# Patient Record
Sex: Female | Born: 1995 | Race: White | Hispanic: No | Marital: Married | Smoking: Current some day smoker
Health system: Southern US, Community
[De-identification: ages and names within clinical notes are randomized; demographics above are authoritative.]

## PROBLEM LIST (undated history)

## (undated) ENCOUNTER — Inpatient Hospital Stay (HOSPITAL_COMMUNITY): Payer: Self-pay

## (undated) DIAGNOSIS — O99345 Other mental disorders complicating the puerperium: Principal | ICD-10-CM

## (undated) DIAGNOSIS — K828 Other specified diseases of gallbladder: Secondary | ICD-10-CM

## (undated) DIAGNOSIS — J45909 Unspecified asthma, uncomplicated: Secondary | ICD-10-CM

## (undated) DIAGNOSIS — F53 Postpartum depression: Secondary | ICD-10-CM

## (undated) DIAGNOSIS — O24419 Gestational diabetes mellitus in pregnancy, unspecified control: Secondary | ICD-10-CM

## (undated) HISTORY — PX: ADENOIDECTOMY: SUR15

## (undated) HISTORY — PX: FRACTURE SURGERY: SHX138

## (undated) HISTORY — DX: Gestational diabetes mellitus in pregnancy, unspecified control: O24.419

## (undated) HISTORY — DX: Postpartum depression: F53.0

## (undated) HISTORY — PX: TONSILLECTOMY: SUR1361

## (undated) HISTORY — DX: Other mental disorders complicating the puerperium: O99.345

---
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
## 2016-01-03 NOTE — ED Notes (Signed)
Message left with patient to return call - re positive UA preg

## 2016-01-17 ENCOUNTER — Encounter: Payer: Self-pay | Admitting: *Deleted

## 2016-01-17 ENCOUNTER — Ambulatory Visit (INDEPENDENT_AMBULATORY_CARE_PROVIDER_SITE_OTHER): Payer: 59

## 2016-01-17 ENCOUNTER — Ambulatory Visit (INDEPENDENT_AMBULATORY_CARE_PROVIDER_SITE_OTHER): Payer: 59 | Admitting: *Deleted

## 2016-01-17 VITALS — BP 130/80 | HR 93 | Ht 62.0 in | Wt 198.0 lb

## 2016-01-17 DIAGNOSIS — Z3A01 Less than 8 weeks gestation of pregnancy: Secondary | ICD-10-CM | POA: Diagnosis not present

## 2016-01-17 DIAGNOSIS — Z331 Pregnant state, incidental: Secondary | ICD-10-CM | POA: Diagnosis not present

## 2016-01-17 DIAGNOSIS — O09611 Supervision of young primigravida, first trimester: Secondary | ICD-10-CM | POA: Diagnosis not present

## 2016-01-17 DIAGNOSIS — O0990 Supervision of high risk pregnancy, unspecified, unspecified trimester: Secondary | ICD-10-CM | POA: Insufficient documentation

## 2016-01-17 DIAGNOSIS — Z3401 Encounter for supervision of normal first pregnancy, first trimester: Secondary | ICD-10-CM

## 2016-01-17 DIAGNOSIS — Z1389 Encounter for screening for other disorder: Secondary | ICD-10-CM | POA: Diagnosis not present

## 2016-01-17 DIAGNOSIS — O3680X0 Pregnancy with inconclusive fetal viability, not applicable or unspecified: Secondary | ICD-10-CM

## 2016-01-17 LAB — POCT URINALYSIS DIPSTICK
GLUCOSE UA: NEGATIVE
Ketones, UA: NEGATIVE
Leukocytes, UA: NEGATIVE
Nitrite, UA: NEGATIVE
Protein, UA: NEGATIVE
RBC UA: NEGATIVE

## 2016-01-17 NOTE — Progress Notes (Signed)
Dating US today. Single IUP with FHR 124 bpm.  CRL measures 6.9 mm which correlates with 6+[redacted] weeks GA.  Bilateral ovaries appear normal. EDD 09/08/16 by early US due to uncertain LMP.

## 2016-01-17 NOTE — Progress Notes (Signed)
Mary Hull is a 20 y.o. G1P0 female here today for initial OB intake/educational visit with RN  Patient's medical, surgical, and obstetrical history obtained and reviewed.  Current medications and allergies also reviewed.   Dating ultrasound today revealed GA of [redacted]wk3d based on LMP/US. EDC 09/03/16   BP 130/80   Pulse 93   Ht 5\' 2"  (1.575 m)   Wt 198 lb (89.8 kg)   LMP 11/28/2015 (Approximate)   BMI 36.21 kg/m   Patient Active Problem List   Diagnosis Date Noted  . Supervision of normal first pregnancy 01/17/2016  . Pregnancy 12/29/2015   Past Medical History:  Diagnosis Date  . Asthma    Past Surgical History:  Procedure Laterality Date  . FRACTURE SURGERY    . TONSILLECTOMY     OB History    Gravida Para Term Preterm AB Living   1             SAB TAB Ectopic Multiple Live Births                  She is taking prenatal vitamins CCNC form completed PN1 labs drawn Baby scripts and mychart activated  Reviewed recommended weight gain based on pre-gravid BMI  Genetic Screening discussed Integrated Screen: requested Cystic fibrosis screening discussed declined  Face-to-face time at least 30 minutes. 50% or more of this visit was spent in counseling and coordination of care.  Return in about 4 weeks (around 02/14/2016) for New OB.   Edward JollyMadren, Latisha Michelle RN-C 01/17/2016 3:15 PM

## 2016-01-18 ENCOUNTER — Encounter: Payer: Self-pay | Admitting: Advanced Practice Midwife

## 2016-01-18 LAB — CBC
Hematocrit: 38 % (ref 34.0–46.6)
Hemoglobin: 12.7 g/dL (ref 11.1–15.9)
MCH: 28.2 pg (ref 26.6–33.0)
MCHC: 33.4 g/dL (ref 31.5–35.7)
MCV: 84 fL (ref 79–97)
PLATELETS: 302 10*3/uL (ref 150–379)
RBC: 4.51 x10E6/uL (ref 3.77–5.28)
RDW: 14.9 % (ref 12.3–15.4)
WBC: 8.9 10*3/uL (ref 3.4–10.8)

## 2016-01-18 LAB — RPR: RPR: NONREACTIVE

## 2016-01-18 LAB — URINALYSIS, ROUTINE W REFLEX MICROSCOPIC
Bilirubin, UA: NEGATIVE
Glucose, UA: NEGATIVE
KETONES UA: NEGATIVE
LEUKOCYTES UA: NEGATIVE
NITRITE UA: NEGATIVE
Protein, UA: NEGATIVE
RBC, UA: NEGATIVE
SPEC GRAV UA: 1.013 (ref 1.005–1.030)
Urobilinogen, Ur: 0.2 mg/dL (ref 0.2–1.0)
pH, UA: 5.5 (ref 5.0–7.5)

## 2016-01-18 LAB — PMP SCREEN PROFILE (10S), URINE
AMPHETAMINE SCRN UR: NEGATIVE ng/mL
Barbiturate Screen, Ur: NEGATIVE ng/mL
Benzodiazepine Screen, Urine: NEGATIVE ng/mL
CANNABINOIDS UR QL SCN: NEGATIVE ng/mL
CREATININE(CRT), U: 54.9 mg/dL (ref 20.0–300.0)
Cocaine(Metab.)Screen, Urine: NEGATIVE ng/mL
METHADONE SCREEN, URINE: NEGATIVE ng/mL
OXYCODONE+OXYMORPHONE UR QL SCN: NEGATIVE ng/mL
Opiate Scrn, Ur: NEGATIVE ng/mL
PCP SCRN UR: NEGATIVE ng/mL
PH UR, DRUG SCRN: 5.8 (ref 4.5–8.9)
PROPOXYPHENE SCREEN: NEGATIVE ng/mL

## 2016-01-18 LAB — RUBELLA SCREEN: Rubella Antibodies, IGG: 24 index (ref >0.99)

## 2016-01-18 LAB — ANTIBODY SCREEN: Antibody Screen: NEGATIVE

## 2016-01-18 LAB — HEPATITIS B SURFACE ANTIGEN: HEP B S AG: NEGATIVE

## 2016-01-18 LAB — HIV ANTIBODY (ROUTINE TESTING W REFLEX): HIV Screen 4th Generation wRfx: NONREACTIVE

## 2016-01-18 LAB — VARICELLA ZOSTER ANTIBODY, IGG: Varicella zoster IgG: 375 index (ref >165)

## 2016-01-18 LAB — ABO/RH: RH TYPE: POSITIVE

## 2016-01-19 LAB — URINE CULTURE

## 2016-01-19 LAB — GC/CHLAMYDIA PROBE AMP
Chlamydia trachomatis, NAA: NEGATIVE
Neisseria gonorrhoeae by PCR: NEGATIVE

## 2016-01-29 NOTE — L&D Delivery Note (Signed)
Delivery Note At 1:05 AM a viable and healthy female was delivered via Vaginal, Spontaneous Delivery (Presentation: ROA;  ).  APGAR: 9, 9; weight  pending.   Placenta status: intact , .  Cord: 3 vessel.    Anesthesia:  epidural Episiotomy: None Lacerations: 1st degree;Periurethral Suture Repair: 3.0 vicryl Est. Blood Loss (mL):  400ml  Mom to postpartum.  Baby to Couplet care / Skin to Skin.  Velta AddisonKendrick C Jeannemarie Sawaya 09/04/2016, 1:39 AM

## 2016-02-14 ENCOUNTER — Encounter: Payer: Self-pay | Admitting: Adult Health

## 2016-02-14 ENCOUNTER — Encounter: Payer: Medicaid Other | Admitting: Advanced Practice Midwife

## 2016-02-22 ENCOUNTER — Ambulatory Visit (INDEPENDENT_AMBULATORY_CARE_PROVIDER_SITE_OTHER): Payer: 59 | Admitting: Advanced Practice Midwife

## 2016-02-22 ENCOUNTER — Encounter: Payer: Self-pay | Admitting: Advanced Practice Midwife

## 2016-02-22 VITALS — BP 110/80 | HR 76 | Wt 194.0 lb

## 2016-02-22 DIAGNOSIS — Z1389 Encounter for screening for other disorder: Secondary | ICD-10-CM

## 2016-02-22 DIAGNOSIS — Z331 Pregnant state, incidental: Secondary | ICD-10-CM

## 2016-02-22 DIAGNOSIS — Z3401 Encounter for supervision of normal first pregnancy, first trimester: Secondary | ICD-10-CM

## 2016-02-22 DIAGNOSIS — Z3A12 12 weeks gestation of pregnancy: Secondary | ICD-10-CM

## 2016-02-22 LAB — POCT URINALYSIS DIPSTICK
Blood, UA: NEGATIVE
Glucose, UA: NEGATIVE
KETONES UA: NEGATIVE
Leukocytes, UA: NEGATIVE
Nitrite, UA: NEGATIVE
PROTEIN UA: NEGATIVE

## 2016-02-22 NOTE — Patient Instructions (Signed)
 First Trimester of Pregnancy The first trimester of pregnancy is from week 1 until the end of week 12 (months 1 through 3). A week after a sperm fertilizes an egg, the egg will implant on the wall of the uterus. This embryo will begin to develop into a baby. Genes from you and your partner are forming the baby. The female genes determine whether the baby is a boy or a girl. At 6-8 weeks, the eyes and face are formed, and the heartbeat can be seen on ultrasound. At the end of 12 weeks, all the baby's organs are formed.  Now that you are pregnant, you will want to do everything you can to have a healthy baby. Two of the most important things are to get good prenatal care and to follow your health care provider's instructions. Prenatal care is all the medical care you receive before the baby's birth. This care will help prevent, find, and treat any problems during the pregnancy and childbirth. BODY CHANGES Your body goes through many changes during pregnancy. The changes vary from woman to woman.   You may gain or lose a couple of pounds at first.  You may feel sick to your stomach (nauseous) and throw up (vomit). If the vomiting is uncontrollable, call your health care provider.  You may tire easily.  You may develop headaches that can be relieved by medicines approved by your health care provider.  You may urinate more often. Painful urination may mean you have a bladder infection.  You may develop heartburn as a result of your pregnancy.  You may develop constipation because certain hormones are causing the muscles that push waste through your intestines to slow down.  You may develop hemorrhoids or swollen, bulging veins (varicose veins).  Your breasts may begin to grow larger and become tender. Your nipples may stick out more, and the tissue that surrounds them (areola) may become darker.  Your gums may bleed and may be sensitive to brushing and flossing.  Dark spots or blotches  (chloasma, mask of pregnancy) may develop on your face. This will likely fade after the baby is born.  Your menstrual periods will stop.  You may have a loss of appetite.  You may develop cravings for certain kinds of food.  You may have changes in your emotions from day to day, such as being excited to be pregnant or being concerned that something may go wrong with the pregnancy and baby.  You may have more vivid and strange dreams.  You may have changes in your hair. These can include thickening of your hair, rapid growth, and changes in texture. Some women also have hair loss during or after pregnancy, or hair that feels dry or thin. Your hair will most likely return to normal after your baby is born. WHAT TO EXPECT AT YOUR PRENATAL VISITS During a routine prenatal visit:  You will be weighed to make sure you and the baby are growing normally.  Your blood pressure will be taken.  Your abdomen will be measured to track your baby's growth.  The fetal heartbeat will be listened to starting around week 10 or 12 of your pregnancy.  Test results from any previous visits will be discussed. Your health care provider may ask you:  How you are feeling.  If you are feeling the baby move.  If you have had any abnormal symptoms, such as leaking fluid, bleeding, severe headaches, or abdominal cramping.  If you have any questions. Other   tests that may be performed during your first trimester include:  Blood tests to find your blood type and to check for the presence of any previous infections. They will also be used to check for low iron levels (anemia) and Rh antibodies. Later in the pregnancy, blood tests for diabetes will be done along with other tests if problems develop.  Urine tests to check for infections, diabetes, or protein in the urine.  An ultrasound to confirm the proper growth and development of the baby.  An amniocentesis to check for possible genetic problems.  Fetal  screens for spina bifida and Down syndrome.  You may need other tests to make sure you and the baby are doing well. HOME CARE INSTRUCTIONS  Medicines  Follow your health care provider's instructions regarding medicine use. Specific medicines may be either safe or unsafe to take during pregnancy.  Take your prenatal vitamins as directed.  If you develop constipation, try taking a stool softener if your health care provider approves. Diet  Eat regular, well-balanced meals. Choose a variety of foods, such as meat or vegetable-based protein, fish, milk and low-fat dairy products, vegetables, fruits, and whole grain breads and cereals. Your health care provider will help you determine the amount of weight gain that is right for you.  Avoid raw meat and uncooked cheese. These carry germs that can cause birth defects in the baby.  Eating four or five small meals rather than three large meals a day may help relieve nausea and vomiting. If you start to feel nauseous, eating a few soda crackers can be helpful. Drinking liquids between meals instead of during meals also seems to help nausea and vomiting.  If you develop constipation, eat more high-fiber foods, such as fresh vegetables or fruit and whole grains. Drink enough fluids to keep your urine clear or pale yellow. Activity and Exercise  Exercise only as directed by your health care provider. Exercising will help you:  Control your weight.  Stay in shape.  Be prepared for labor and delivery.  Experiencing pain or cramping in the lower abdomen or low back is a good sign that you should stop exercising. Check with your health care provider before continuing normal exercises.  Try to avoid standing for long periods of time. Move your legs often if you must stand in one place for a long time.  Avoid heavy lifting.  Wear low-heeled shoes, and practice good posture.  You may continue to have sex unless your health care provider directs you  otherwise. Relief of Pain or Discomfort  Wear a good support bra for breast tenderness.   Take warm sitz baths to soothe any pain or discomfort caused by hemorrhoids. Use hemorrhoid cream if your health care provider approves.   Rest with your legs elevated if you have leg cramps or low back pain.  If you develop varicose veins in your legs, wear support hose. Elevate your feet for 15 minutes, 3-4 times a day. Limit salt in your diet. Prenatal Care  Schedule your prenatal visits by the twelfth week of pregnancy. They are usually scheduled monthly at first, then more often in the last 2 months before delivery.  Write down your questions. Take them to your prenatal visits.  Keep all your prenatal visits as directed by your health care provider. Safety  Wear your seat belt at all times when driving.  Make a list of emergency phone numbers, including numbers for family, friends, the hospital, and police and fire departments. General   Tips  Ask your health care provider for a referral to a local prenatal education class. Begin classes no later than at the beginning of month 6 of your pregnancy.  Ask for help if you have counseling or nutritional needs during pregnancy. Your health care provider can offer advice or refer you to specialists for help with various needs.  Do not use hot tubs, steam rooms, or saunas.  Do not douche or use tampons or scented sanitary pads.  Do not cross your legs for long periods of time.  Avoid cat litter boxes and soil used by cats. These carry germs that can cause birth defects in the baby and possibly loss of the fetus by miscarriage or stillbirth.  Avoid all smoking, herbs, alcohol, and medicines not prescribed by your health care provider. Chemicals in these affect the formation and growth of the baby.  Schedule a dentist appointment. At home, brush your teeth with a soft toothbrush and be gentle when you floss. SEEK MEDICAL CARE IF:   You have  dizziness.  You have mild pelvic cramps, pelvic pressure, or nagging pain in the abdominal area.  You have persistent nausea, vomiting, or diarrhea.  You have a bad smelling vaginal discharge.  You have pain with urination.  You notice increased swelling in your face, hands, legs, or ankles. SEEK IMMEDIATE MEDICAL CARE IF:   You have a fever.  You are leaking fluid from your vagina.  You have spotting or bleeding from your vagina.  You have severe abdominal cramping or pain.  You have rapid weight gain or loss.  You vomit blood or material that looks like coffee grounds.  You are exposed to German measles and have never had them.  You are exposed to fifth disease or chickenpox.  You develop a severe headache.  You have shortness of breath.  You have any kind of trauma, such as from a fall or a car accident. Document Released: 01/08/2001 Document Revised: 05/31/2013 Document Reviewed: 11/24/2012 [employer] Patient Information 2015 [employer]. This information is not intended to replace advice given to you by your health care provider. Make sure you discuss any questions you have with your health care provider.   Nausea & Vomiting  Have saltine crackers or pretzels by your bed and eat a few bites before you raise your head out of bed in the morning  Eat small frequent meals throughout the day instead of large meals  Drink plenty of fluids throughout the day to stay hydrated, just don't drink a lot of fluids with your meals.  This can make your stomach fill up faster making you feel sick  Do not brush your teeth right after you eat  Products with real ginger are good for nausea, like ginger ale and ginger hard candy Make sure it says made with real ginger!  Sucking on sour candy like lemon heads is also good for nausea  If your prenatal vitamins make you nauseated, take them at night so you will sleep through the nausea  Sea Bands  If you feel like you need  medicine for the nausea & vomiting please let us know  If you are unable to keep any fluids or food down please let us know   Constipation  Drink plenty of fluid, preferably water, throughout the day  Eat foods high in fiber such as fruits, vegetables, and grains  Exercise, such as walking, is a good way to keep your bowels regular  Drink warm fluids, especially warm   prune juice, or decaf coffee  Eat a 1/2 cup of real oatmeal (not instant), 1/2 cup applesauce, and 1/2-1 cup warm prune juice every day  If needed, you may take Colace (docusate sodium) stool softener once or twice a day to help keep the stool soft. If you are pregnant, wait until you are out of your first trimester (12-14 weeks of pregnancy)  If you still are having problems with constipation, you may take Miralax once daily as needed to help keep your bowels regular.  If you are pregnant, wait until you are out of your first trimester (12-14 weeks of pregnancy)  Safe Medications in Pregnancy   Acne: Benzoyl Peroxide Salicylic Acid  Backache/Headache: Tylenol: 2 regular strength every 4 hours OR              2 Extra strength every 6 hours  Colds/Coughs/Allergies: Benadryl (alcohol free) 25 mg every 6 hours as needed Breath right strips Claritin Cepacol throat lozenges Chloraseptic throat spray Cold-Eeze- up to three times per day Cough drops, alcohol free Flonase (by prescription only) Guaifenesin Mucinex Robitussin DM (plain only, alcohol free) Saline nasal spray/drops Sudafed (pseudoephedrine) & Actifed ** use only after [redacted] weeks gestation and if you do not have high blood pressure Tylenol Vicks Vaporub Zinc lozenges Zyrtec   Constipation: Colace Ducolax suppositories Fleet enema Glycerin suppositories Metamucil Milk of magnesia Miralax Senokot Smooth move tea  Diarrhea: Kaopectate Imodium A-D  *NO pepto Bismol  Hemorrhoids: Anusol Anusol HC Preparation  H Tucks  Indigestion: Tums Maalox Mylanta Zantac  Pepcid  Insomnia: Benadryl (alcohol free) 25mg every 6 hours as needed Tylenol PM Unisom, no Gelcaps  Leg Cramps: Tums MagGel  Nausea/Vomiting:  Bonine Dramamine Emetrol Ginger extract Sea bands Meclizine  Nausea medication to take during pregnancy:  Unisom (doxylamine succinate 25 mg tablets) Take one tablet daily at bedtime. If symptoms are not adequately controlled, the dose can be increased to a maximum recommended dose of two tablets daily (1/2 tablet in the morning, 1/2 tablet mid-afternoon and one at bedtime). Vitamin B6 100mg tablets. Take one tablet twice a day (up to 200 mg per day).  Skin Rashes: Aveeno products Benadryl cream or 25mg every 6 hours as needed Calamine Lotion 1% cortisone cream  Yeast infection: Gyne-lotrimin 7 Monistat 7   **If taking multiple medications, please check labels to avoid duplicating the same active ingredients **take medication as directed on the label ** Do not exceed 4000 mg of tylenol in 24 hours **Do not take medications that contain aspirin or ibuprofen      

## 2016-02-22 NOTE — Progress Notes (Signed)
  Subjective:    Mary BrackettKatelynn L Hull is a G1P0 4641w2d being seen today for her first obstetrical visit.  Her obstetrical history is significant for first pregnancy.  Pregnancy history fully reviewed.  Patient reports fatigue.  Vitals:   02/22/16 1515  BP: 110/80  Pulse: 76  Weight: 194 lb (88 kg)    HISTORY: OB History  Gravida Para Term Preterm AB Living  1            SAB TAB Ectopic Multiple Live Births               # Outcome Date GA Lbr Len/2nd Weight Sex Delivery Anes PTL Lv  1 Current              Past Medical History:  Diagnosis Date  . Asthma    Past Surgical History:  Procedure Laterality Date  . FRACTURE SURGERY    . TONSILLECTOMY     History reviewed. No pertinent family history.   Exam                                      System:     Skin: normal coloration and turgor, no rashes    Neurologic: oriented, normal, normal mood   Extremities: normal strength, tone, and muscle mass   HEENT PERRLA   Mouth/Teeth mucous membranes moist, normal dentition   Neck supple and no masses   Cardiovascular: regular rate and rhythm   Respiratory:  appears well, vitals normal, no respiratory distress, acyanotic   Abdomen: soft, non-tender;  FHR: 150 US          Assessment:    Pregnancy: G1P0 Patient Active Problem List   Diagnosis Date Noted  . Supervision of normal first pregnancy 01/17/2016  . Pregnancy 12/29/2015        Plan:     . Continue prenatal vitamins  Problem list reviewed and updated  Reviewed n/v relief measures and warning s/s to report  Reviewed recommended weight gain based on pre-gravid BMI  Encouraged well-balanced diet Genetic Screening discussed Integrated Screen: requested.  Ultrasound discussed; fetal survey: requested.  Return for ASAP for NT/IT (pt was not rescheduled for US when we had snow day) and 4 weeks for LROB/2nd IT.  CRESENZO-DISHMAN,Bahar Shelden 02/22/2016

## 2016-02-23 ENCOUNTER — Encounter: Payer: Self-pay | Admitting: Advanced Practice Midwife

## 2016-02-26 ENCOUNTER — Other Ambulatory Visit: Payer: Self-pay | Admitting: Advanced Practice Midwife

## 2016-02-26 DIAGNOSIS — Z3682 Encounter for antenatal screening for nuchal translucency: Secondary | ICD-10-CM

## 2016-02-27 ENCOUNTER — Other Ambulatory Visit: Payer: 59

## 2016-02-27 ENCOUNTER — Ambulatory Visit (INDEPENDENT_AMBULATORY_CARE_PROVIDER_SITE_OTHER): Payer: 59

## 2016-02-27 DIAGNOSIS — Z3682 Encounter for antenatal screening for nuchal translucency: Secondary | ICD-10-CM

## 2016-02-27 DIAGNOSIS — Z3401 Encounter for supervision of normal first pregnancy, first trimester: Secondary | ICD-10-CM

## 2016-02-27 DIAGNOSIS — Z3A13 13 weeks gestation of pregnancy: Secondary | ICD-10-CM

## 2016-02-27 NOTE — Progress Notes (Signed)
US 13 wks,measurements c/w dates,normal ov's bilat,NB present,NT 1.6 mm, crl 67.2 mm,fht 176 bpm

## 2016-02-28 ENCOUNTER — Emergency Department (HOSPITAL_COMMUNITY)
Admission: EM | Admit: 2016-02-28 | Discharge: 2016-02-28 | Disposition: A | Discharge status: Home / Self Care | Payer: Medicaid Other | Attending: Emergency Medicine | Admitting: Emergency Medicine

## 2016-02-28 ENCOUNTER — Encounter (HOSPITAL_COMMUNITY): Payer: Self-pay | Admitting: Emergency Medicine

## 2016-02-28 DIAGNOSIS — Z79899 Other long term (current) drug therapy: Secondary | ICD-10-CM | POA: Diagnosis not present

## 2016-02-28 DIAGNOSIS — O99511 Diseases of the respiratory system complicating pregnancy, first trimester: Secondary | ICD-10-CM | POA: Insufficient documentation

## 2016-02-28 DIAGNOSIS — J069 Acute upper respiratory infection, unspecified: Secondary | ICD-10-CM | POA: Insufficient documentation

## 2016-02-28 DIAGNOSIS — J45909 Unspecified asthma, uncomplicated: Secondary | ICD-10-CM | POA: Insufficient documentation

## 2016-02-28 DIAGNOSIS — Z3A10 10 weeks gestation of pregnancy: Secondary | ICD-10-CM | POA: Insufficient documentation

## 2016-02-28 DIAGNOSIS — Z87891 Personal history of nicotine dependence: Secondary | ICD-10-CM | POA: Diagnosis not present

## 2016-02-28 LAB — URINALYSIS, ROUTINE W REFLEX MICROSCOPIC
BILIRUBIN URINE: NEGATIVE
GLUCOSE, UA: NEGATIVE mg/dL
Hgb urine dipstick: NEGATIVE
KETONES UR: 20 mg/dL — AB
LEUKOCYTES UA: NEGATIVE
NITRITE: NEGATIVE
PH: 6 (ref 5.0–8.0)
PROTEIN: NEGATIVE mg/dL
Specific Gravity, Urine: 1.017 (ref 1.005–1.030)

## 2016-02-28 LAB — INFLUENZA PANEL BY PCR (TYPE A & B)
Influenza A By PCR: NEGATIVE
Influenza B By PCR: NEGATIVE

## 2016-02-28 NOTE — ED Notes (Signed)
Pt alert & oriented x4, stable gait. Patient given discharge instructions, paperwork & prescription(s). Patient  instructed to stop at the registration desk to finish any additional paperwork. Patient verbalized understanding. Pt left department w/ no further questions. 

## 2016-02-28 NOTE — ED Triage Notes (Addendum)
Having chills for last 2 days.  C/o body aches 3/10.  Slight headache 3/10.  Denies any n/v/d.  Runny nose.  Pt is [redacted] weeks pregnant.  Pt work at daycare.

## 2016-02-28 NOTE — ED Provider Notes (Signed)
[facility]-EM[facility]ENCY DEPT Provider Note   CSN: [id number] Arrival date & time: 02/28/16  1712     History   Chief Complaint Chief Complaint  Patient presents with  . Chills    HPI Mary Hull is a 21 y.o. female currently [redacted] weeks pregnant (followed by [facility]) presenting with a 2 day history of uri type symptoms which includes nasal congestion with clear rhinorrhea, non productive cough, chills with body aches, mild headache and generalized fatigue.  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  Symptoms do not include shortness of breath, chest pain,  Nausea, vomiting or diarrhea.  The patient has taken no medicines prior to arrival.  The history is provided by the patient.    Past Medical History:  Diagnosis Date  . Asthma     Patient Active Problem List   Diagnosis Date Noted  . Supervision of normal first pregnancy 01/17/2016  . Pregnancy 12/29/2015    Past Surgical History:  Procedure Laterality Date  . FRACTURE SURGERY    . TONSILLECTOMY      OB History    Gravida Para Term Preterm AB Living   1             SAB TAB Ectopic Multiple Live Births                   Home Medications    Prior to Admission medications   Medication Sig Start Date End Date Taking? Authorizing Provider  albuterol (PROVENTIL HFA;VENTOLIN HFA) 108 (90 Base) MCG/ACT inhaler Inhale 1-2 puffs into the lungs every 6 (six) hours as needed for wheezing or shortness of breath. Patient not taking: Reported on 02/22/2016 11/06/15   Vanetta Mulders, MD  albuterol (PROVENTIL) (2.5 MG/3ML) 0.083% nebulizer solution Take 3 mLs (2.5 mg total) by nebulization every 4 (four) hours as needed for wheezing or shortness of breath. Patient not taking: Reported on 02/22/2016 04/09/15   Mancel Bale, MD  Prenatal-DSS-FeCb-FeGl-FA [facility] BLOOM) 90-1 MG TABS Take 1 tablet by mouth every morning. 12/29/15   Adline Potter, NP    Family History History reviewed. No pertinent family  history.  Social History Social History  Substance Use Topics  . Smoking status: Former Smoker    Years: 1.00    Types: Cigarettes  . Smokeless tobacco: Never Used  . Alcohol use No     Allergies   Tussionex pennkinetic er [hydrocod polst-cpm polst er]   Review of Systems Review of Systems  Constitutional: Negative for chills and fever.  HENT: Positive for congestion, rhinorrhea and sore throat. Negative for ear pain, sinus pressure, trouble swallowing and voice change.   Eyes: Negative for discharge.  Respiratory: Positive for cough. Negative for shortness of breath, wheezing and stridor.   Cardiovascular: Negative for chest pain.  Gastrointestinal: Negative for abdominal pain, diarrhea, nausea and vomiting.  Genitourinary: Negative.      Physical Exam Updated Vital Signs BP (!) 92/51 (BP Location: Right Arm)   Pulse 94   Temp 98.1 F (36.7 C) (Oral)   Resp 16   Ht 5\' 2"  (1.575 m)   Wt 88 kg   LMP 11/28/2015 (Approximate)   SpO2 100%   BMI 35.48 kg/m   Physical Exam  Constitutional: She is oriented to person, place, and time. She appears well-developed and well-nourished.  HENT:  Head: Normocephalic and atraumatic.  Right Ear: Tympanic membrane and ear canal normal.  Left Ear: Tympanic membrane and ear canal normal.  Nose:  Mucosal edema and rhinorrhea present.  Mouth/Throat: Uvula is midline, oropharynx is clear and moist and mucous membranes are normal. No oropharyngeal exudate, posterior oropharyngeal edema, posterior oropharyngeal erythema or tonsillar abscesses.  Eyes: Conjunctivae are normal.  Cardiovascular: Normal rate and normal heart sounds.   Pulmonary/Chest: Effort normal. No respiratory distress. She has no wheezes. She has no rales.  Abdominal: Soft. There is no tenderness.  Musculoskeletal: Normal range of motion.  Neurological: She is alert and oriented to person, place, and time.  Skin: Skin is warm and dry. No rash noted.  Psychiatric: She  has a normal mood and affect.     ED Treatments / Results  Labs (all labs ordered are listed, but only abnormal results are displayed) Labs Reviewed  URINALYSIS, ROUTINE W REFLEX MICROSCOPIC - Abnormal; Notable for the following:       Result Value   Ketones, ur 20 (*)    All other components within normal limits  INFLUENZA PANEL BY PCR (TYPE A & B)    EKG  EKG Interpretation None       Radiology No results found.  Procedures Procedures (including critical care time)  Medications Ordered in ED Medications - No data to display   Initial Impression / Assessment and Plan / ED Course  I have reviewed the triage vital signs and the nursing notes.  Pertinent labs & imaging results that were available during my care of the patient were reviewed by me and considered in my medical decision making (see chart for details).     Patient is negative for influenza.  She was encouraged to rest, increase fluid intake, Tylenol for body aches.  She was also given a list of safe medications she can take during pregnancy.  Patient is in no acute distress.    The patient appears reasonably screened and/or stabilized for discharge and I doubt any other medical condition or other [facility] requiring further screening, evaluation, or treatment in the ED at this time prior to discharge.  Final Clinical Impressions(s) / ED Diagnoses   Final diagnoses:  Viral upper respiratory tract infection    New Prescriptions New Prescriptions   No medications on file     Burgess AmorJulie Chloris Marcoux, PA-C 02/28/16 1926    Donnetta HutchingBrian Cook, MD 03/01/16 1421

## 2016-02-29 ENCOUNTER — Encounter: Payer: Self-pay | Admitting: Advanced Practice Midwife

## 2016-03-05 LAB — MATERNAL SCREEN, INTEGRATED #1
Crown Rump Length: 67.2 mm
Gest. Age on Collection Date: 12.9 weeks
MATERNAL AGE AT EDD: 19.7 a
NUCHAL TRANSLUCENCY (NT): 1.6 mm
NUMBER OF FETUSES: 1
PAPP-A VALUE: 818.1 ng/mL
Weight: 192 [lb_av]

## 2016-03-21 ENCOUNTER — Encounter: Payer: Self-pay | Admitting: Obstetrics and Gynecology

## 2016-03-21 ENCOUNTER — Ambulatory Visit (INDEPENDENT_AMBULATORY_CARE_PROVIDER_SITE_OTHER): Payer: Medicaid Other | Admitting: Obstetrics and Gynecology

## 2016-03-21 VITALS — BP 110/60 | HR 84 | Wt 191.4 lb

## 2016-03-21 DIAGNOSIS — Z3A16 16 weeks gestation of pregnancy: Secondary | ICD-10-CM

## 2016-03-21 DIAGNOSIS — Z331 Pregnant state, incidental: Secondary | ICD-10-CM

## 2016-03-21 DIAGNOSIS — Z3401 Encounter for supervision of normal first pregnancy, first trimester: Secondary | ICD-10-CM

## 2016-03-21 DIAGNOSIS — Z1389 Encounter for screening for other disorder: Secondary | ICD-10-CM

## 2016-03-21 DIAGNOSIS — Z3682 Encounter for antenatal screening for nuchal translucency: Secondary | ICD-10-CM

## 2016-03-21 LAB — POCT URINALYSIS DIPSTICK
Blood, UA: NEGATIVE
Glucose, UA: NEGATIVE
Ketones, UA: NEGATIVE
Leukocytes, UA: NEGATIVE
Nitrite, UA: NEGATIVE
Protein, UA: NEGATIVE

## 2016-03-21 NOTE — Progress Notes (Signed)
G1P0 6235w2d Estimated Date of Delivery: 09/03/16 LROB  Patient reports good fetal movement, denies any bleeding and no rupture of membranes symptoms or regular contractions. Patient complaints: back pain. Pt notes that she has lost 7 lbs since the start of her pregnancy. Pt denies any other symptoms.  Blood pressure 110/60, pulse 84, weight 191 lb 6.4 oz (86.8 kg), last menstrual period 11/28/2015. refer to the ob flow sheet for FH and FHR, also BP, Wt, Urine results:notable for negative                          Physical Examination:  General appearance - alert, well appearing, and in no distress Abdomen - FHR 156                                            Questions were answered. Assessment: LROB G1P0 @ 335w2d  Plan:  Continued routine obstetrical care  F/u in 4 weeks for U/S anatomy and routine OB  By signing my name below, I, Soijett Blue, attest that this documentation has been prepared under the direction and in the presence of Tilda BurrowJohn Ediberto Sens V, MD. Electronically Signed: Soijett Blue, ED Scribe. 03/21/16. 4:27 PM.   I personally performed the services described in this documentation, which was SCRIBED in my presence. The recorded information has been reviewed and considered accurate. It has been edited as necessary during review. Tilda BurrowFERGUSON,Orlondo Holycross V, MD

## 2016-03-24 ENCOUNTER — Inpatient Hospital Stay (HOSPITAL_COMMUNITY)
Admission: AD | Admit: 2016-03-24 | Discharge: 2016-03-24 | Disposition: A | Discharge status: Home / Self Care | Payer: Medicaid Other | Source: Ambulatory Visit | Attending: Obstetrics and Gynecology | Admitting: Obstetrics and Gynecology

## 2016-03-24 ENCOUNTER — Encounter (HOSPITAL_COMMUNITY): Payer: Self-pay | Admitting: *Deleted

## 2016-03-24 DIAGNOSIS — O99512 Diseases of the respiratory system complicating pregnancy, second trimester: Secondary | ICD-10-CM | POA: Diagnosis not present

## 2016-03-24 DIAGNOSIS — R1010 Upper abdominal pain, unspecified: Secondary | ICD-10-CM | POA: Diagnosis present

## 2016-03-24 DIAGNOSIS — Z87891 Personal history of nicotine dependence: Secondary | ICD-10-CM | POA: Insufficient documentation

## 2016-03-24 DIAGNOSIS — O26892 Other specified pregnancy related conditions, second trimester: Secondary | ICD-10-CM | POA: Diagnosis not present

## 2016-03-24 DIAGNOSIS — Z3A16 16 weeks gestation of pregnancy: Secondary | ICD-10-CM | POA: Insufficient documentation

## 2016-03-24 DIAGNOSIS — R1013 Epigastric pain: Secondary | ICD-10-CM | POA: Insufficient documentation

## 2016-03-24 DIAGNOSIS — Z3A17 17 weeks gestation of pregnancy: Secondary | ICD-10-CM | POA: Diagnosis not present

## 2016-03-24 DIAGNOSIS — Z3401 Encounter for supervision of normal first pregnancy, first trimester: Secondary | ICD-10-CM

## 2016-03-24 DIAGNOSIS — J45909 Unspecified asthma, uncomplicated: Secondary | ICD-10-CM | POA: Diagnosis not present

## 2016-03-24 LAB — URINALYSIS, ROUTINE W REFLEX MICROSCOPIC
BILIRUBIN URINE: NEGATIVE
Glucose, UA: NEGATIVE mg/dL
HGB URINE DIPSTICK: NEGATIVE
KETONES UR: 20 mg/dL — AB
Leukocytes, UA: NEGATIVE
NITRITE: NEGATIVE
Protein, ur: NEGATIVE mg/dL
Specific Gravity, Urine: 1.002 — ABNORMAL LOW (ref 1.005–1.030)
pH: 6 (ref 5.0–8.0)

## 2016-03-24 LAB — CBC WITH DIFFERENTIAL/PLATELET
BASOS ABS: 0 10*3/uL (ref 0.0–0.1)
Basophils Relative: 0 %
EOS PCT: 1 %
Eosinophils Absolute: 0.1 10*3/uL (ref 0.0–0.7)
HEMATOCRIT: 33.1 % — AB (ref 36.0–46.0)
Hemoglobin: 11.5 g/dL — ABNORMAL LOW (ref 12.0–15.0)
LYMPHS ABS: 1.5 10*3/uL (ref 0.7–4.0)
LYMPHS PCT: 16 %
MCH: 28.6 pg (ref 26.0–34.0)
MCHC: 34.7 g/dL (ref 30.0–36.0)
MCV: 82.3 fL (ref 78.0–100.0)
Monocytes Absolute: 0.2 10*3/uL (ref 0.1–1.0)
Monocytes Relative: 2 %
NEUTROS ABS: 7.9 10*3/uL — AB (ref 1.7–7.7)
Neutrophils Relative %: 81 %
PLATELETS: 224 10*3/uL (ref 150–400)
RBC: 4.02 MIL/uL (ref 3.87–5.11)
RDW: 14.5 % (ref 11.5–15.5)
WBC: 9.7 10*3/uL (ref 4.0–10.5)

## 2016-03-24 LAB — LIPASE, BLOOD: LIPASE: 14 U/L (ref 11–51)

## 2016-03-24 LAB — COMPREHENSIVE METABOLIC PANEL
ALK PHOS: 64 U/L (ref 38–126)
ALT: 28 U/L (ref 14–54)
AST: 20 U/L (ref 15–41)
Albumin: 3.3 g/dL — ABNORMAL LOW (ref 3.5–5.0)
Anion gap: 8 (ref 5–15)
BILIRUBIN TOTAL: 0.5 mg/dL (ref 0.3–1.2)
CALCIUM: 8.8 mg/dL — AB (ref 8.9–10.3)
CO2: 21 mmol/L — ABNORMAL LOW (ref 22–32)
CREATININE: 0.44 mg/dL (ref 0.44–1.00)
Chloride: 105 mmol/L (ref 101–111)
Glucose, Bld: 91 mg/dL (ref 65–99)
Potassium: 3.2 mmol/L — ABNORMAL LOW (ref 3.5–5.1)
Sodium: 134 mmol/L — ABNORMAL LOW (ref 135–145)
TOTAL PROTEIN: 7.2 g/dL (ref 6.5–8.1)

## 2016-03-24 LAB — AMYLASE: AMYLASE: 34 U/L (ref 28–100)

## 2016-03-24 MED ORDER — FAMOTIDINE 20 MG PO TABS: 20.0000 mg | ORAL_TABLET | Freq: Two times a day (BID) | ORAL | 3 refills | Status: DC

## 2016-03-24 NOTE — Discharge Instructions (Signed)
Biliary Colic, Adult °Biliary colic is severe pain caused by a problem with a small organ in the upper right part of your belly (gallbladder). The gallbladder stores a digestive fluid produced in the liver (bile) that helps the body break down fat. Bile and other digestive enzymes are carried from the liver to the small intestine though tube-like structures (bile ducts). The gallbladder and the bile ducts form the biliary tract. °Sometimes hard deposits of digestive fluids form in the gallbladder (gallstones) and block the flow of bile from the gallbladder, causing biliary colic. This condition is also called a gallbladder attack. Gallstones can be as small as a grain of sand or as big as a golf ball. There could be just one gallstone in the gallbladder, or there could be many. °What are the causes? °Biliary colic is usually caused by gallstones. Less often, a tumor could block the flow of bile from the gallbladder and trigger biliary colic. °What increases the risk? °This condition is more likely to develop in: °· Women. °· People of Hispanic descent. °· People with a family history of gallstones. °· People who are obese. °· People who suddenly or quickly lose weight. °· People who eat a high-calorie, low-fiber diet that is rich in refined carbs (carbohydrates), such as white bread and white rice. °· People who have an intestinal disease that affects nutrient absorption, such as Crohn disease. °· People who have a metabolic condition, such as metabolic syndrome or diabetes. °What are the signs or symptoms? °Severe pain in the upper right side of the belly is the main symptom of biliary colic. You may feel this pain below the chest but above the hip. This pain often occurs at night or after eating a very fatty meal. This pain may get worse for up to an hour and last as long as 12 hours. In most cases, the pain fades (subsides) within a couple hours. °Other symptoms of this condition include: °· Nausea and  vomiting. °· Pain under the right shoulder. °How is this diagnosed? °This condition is diagnosed based on your medical history, your symptoms, and a physical exam. You may have tests, including: °· Blood tests to rule out infection or inflammation of the bile ducts, gallbladder, pancreas, or liver. °· Imaging studies such as: °¨ Ultrasound. °¨ CT scan. °¨ MRI. °In some cases, you may need to have an imaging study done using a small amount of radioactive material (nuclear medicine) to confirm the diagnosis. °How is this treated? °Treatment for this condition may include medicine to relieve your pain or nausea. If you have gallstones that are causing biliary colic, you may need surgery to remove the gallbladder (cholecystectomy). Gallstones can also be dissolved gradually with medicine. It may take months or years before the gallstones are completely gone. °Follow these instructions at home: °· Take over-the-counter and prescription medicines only as told by your health care provider. °· Drink enough fluid to keep your urine clear or pale yellow. °· Follow instructions from your health care provider about eating or drinking restrictions. These may include avoiding: °¨ Fatty, greasy, and fried foods. °¨ Any foods that make the pain worse. °¨ Overeating. °¨ Having a large meal after not eating for a while. °· Keep all follow-up visits as told by your health care provider. This is important. °How is this prevented? °Steps to prevent this condition include: °· Maintaining a healthy body weight. °· Getting regular exercise. °· Eating a healthy, high-fiber, low-fat diet. °· Limiting how much sugar   and refined carbs you eat, such as sweets, white flour, and white rice. °Contact a health care provider if: °· Your pain lasts more than 5 hours. °· You vomit. °· You have a fever and chills. °· Your pain gets worse. °Get help right away if: °· Your skin or the whites of your eyes look yellow (jaundice). °· Your have tea-colored  urine and light-colored stools. °· You are dizzy or you faint. °This information is not intended to replace advice given to you by your health care provider. Make sure you discuss any questions you have with your health care provider. °Document Released: 06/17/2005 Document Revised: 09/12/2015 Document Reviewed: 07/31/2015 °Elsevier Interactive Patient Education © 2017 Elsevier Inc. ° °

## 2016-03-24 NOTE — MAU Provider Note (Signed)
Chief Complaint: Abdominal Pain   First Provider Initiated Contact with Patient 03/24/16 1410     SUBJECTIVE HPI: Mary Hull is a 21 y.o. G1P0 at [redacted]w[redacted]d who presents to [facility] reporting intermittent upper abd pain since last night.   Location: upper abd from umbilicus to ribs Quality: Unable to verbalize Severity: 6/10 on pain scale Duration: ~16 hours Context: None Timing: intermittent Modifying factors: None. Hasn't tried anything for the pain. No relationship to eating.  Associated signs and symptoms: Neg for fever, chills, N/V/D/C, heartburn.   Past Medical History:  Diagnosis Date  . Asthma    OB History  Gravida Para Term Preterm AB Living  1            SAB TAB Ectopic Multiple Live Births               # Outcome Date GA Lbr Len/2nd Weight Sex Delivery Anes PTL Lv  1 Current              Past Surgical History:  Procedure Laterality Date  . FRACTURE SURGERY     L elbow  . TONSILLECTOMY     Social History   Social History  . Marital status: Married    Spouse name: N/A  . Number of children: N/A  . Years of education: N/A   Occupational History  . Not on file.   Social History Main Topics  . Smoking status: Former Smoker    Years: 1.00    Types: Cigarettes  . Smokeless tobacco: Never Used  . Alcohol use No  . Drug use: No  . Sexual activity: Yes    Birth control/ protection: None   Other Topics Concern  . Not on file   Social History Narrative  . No narrative on file   No family history on file. No current facility-administered medications on file prior to encounter.    Current Outpatient Prescriptions on File Prior to Encounter  Medication Sig Dispense Refill  . albuterol (PROVENTIL HFA;VENTOLIN HFA) 108 (90 Base) MCG/ACT inhaler Inhale 1-2 puffs into the lungs every 6 (six) hours as needed for wheezing or shortness of breath. 1 Inhaler 0  . albuterol (PROVENTIL) (2.5 MG/3ML) 0.083% nebulizer solution Take 3 mLs (2.5 mg  total) by nebulization every 4 (four) hours as needed for wheezing or shortness of breath. 30 vial 0   Allergies  Allergen Reactions  . Tussionex Pennkinetic Er [Hydrocod Polst-Cpm Polst Er] Hives, Swelling and Other (See Comments)    Reaction:  Unspecified swelling reaction     I have reviewed patient's Past Medical Hx, Surgical Hx, Family Hx, Social Hx, medications and allergies.   Review of Systems  Constitutional: Negative for appetite change, chills and fever.  Gastrointestinal: Positive for abdominal pain. Negative for abdominal distention, constipation, diarrhea, nausea and vomiting.  Genitourinary: Negative for dysuria, flank pain, vaginal bleeding and vaginal discharge.  Musculoskeletal: Negative for back pain.    OBJECTIVE Patient Vitals for the past 24 hrs:  BP Temp Temp src Pulse Resp Height Weight  03/24/16 1339 109/67 98.3 F (36.8 C) Oral 101 18 - -  03/24/16 1330 - - - - - 5\' 3"  (1.6 m) 187 lb (84.8 kg)   Constitutional: Well-developed, well-nourished female in no acute distress.  Cardiovascular: normal rate Respiratory: normal rate and effort.  GI: Abd soft, non-tender, gravid appropriate for gestational age. Decrease BS in RUQ, otherwise Nml. Neurologic: Alert and oriented x 4.  [id number]  FHR 147 by doppler  LAB RESULTS Results for orders placed or performed during the hospital encounter of 03/24/16 (from the past 24 hour(s))  Urinalysis, Routine w reflex microscopic     Status: Abnormal   Collection Time: 03/24/16  1:26 PM  Result Value Ref Range   Color, Urine YELLOW YELLOW   APPearance CLEAR CLEAR   Specific Gravity, Urine 1.002 (L) 1.005 - 1.030   pH 6.0 5.0 - 8.0   Glucose, UA NEGATIVE NEGATIVE mg/dL   Hgb urine dipstick NEGATIVE NEGATIVE   Bilirubin Urine NEGATIVE NEGATIVE   Ketones, ur 20 (A) NEGATIVE mg/dL   Protein, ur NEGATIVE NEGATIVE mg/dL   Nitrite NEGATIVE NEGATIVE   Leukocytes, UA NEGATIVE NEGATIVE  CBC with Differential/Platelet      Status: Abnormal   Collection Time: 03/24/16  1:56 PM  Result Value Ref Range   WBC 9.7 4.0 - 10.5 K/uL   RBC 4.02 3.87 - 5.11 MIL/uL   Hemoglobin 11.5 (L) 12.0 - 15.0 g/dL   HCT 72.5 (L) 36.6 - 44.0 %   MCV 82.3 78.0 - 100.0 fL   MCH 28.6 26.0 - 34.0 pg   MCHC 34.7 30.0 - 36.0 g/dL   RDW 34.7 42.5 - 95.6 %   Platelets 224 150 - 400 K/uL   Neutrophils Relative % 81 %   Neutro Abs 7.9 (H) 1.7 - 7.7 K/uL   Lymphocytes Relative 16 %   Lymphs Abs 1.5 0.7 - 4.0 K/uL   Monocytes Relative 2 %   Monocytes Absolute 0.2 0.1 - 1.0 K/uL   Eosinophils Relative 1 %   Eosinophils Absolute 0.1 0.0 - 0.7 K/uL   Basophils Relative 0 %   Basophils Absolute 0.0 0.0 - 0.1 K/uL  Comprehensive metabolic panel     Status: Abnormal   Collection Time: 03/24/16  1:56 PM  Result Value Ref Range   Sodium 134 (L) 135 - 145 mmol/L   Potassium 3.2 (L) 3.5 - 5.1 mmol/L   Chloride 105 101 - 111 mmol/L   CO2 21 (L) 22 - 32 mmol/L   Glucose, Bld 91 65 - 99 mg/dL   BUN <5 (L) 6 - 20 mg/dL   Creatinine, Ser 3.87 0.44 - 1.00 mg/dL   Calcium 8.8 (L) 8.9 - 10.3 mg/dL   Total Protein 7.2 6.5 - 8.1 g/dL   Albumin 3.3 (L) 3.5 - 5.0 g/dL   AST 20 15 - 41 U/L   ALT 28 14 - 54 U/L   Alkaline Phosphatase 64 38 - 126 U/L   Total Bilirubin 0.5 0.3 - 1.2 mg/dL   GFR calc non Af Amer >60 >60 mL/min   GFR calc Af Amer >60 >60 mL/min   Anion gap 8 5 - 15  Amylase     Status: None   Collection Time: 03/24/16  1:56 PM  Result Value Ref Range   Amylase 34 28 - 100 U/L  Lipase, blood     Status: None   Collection Time: 03/24/16  1:56 PM  Result Value Ref Range   Lipase 14 11 - 51 U/L    IMAGING NA  MAU COURSE Orders Placed This Encounter  Procedures  . US Abdomen Limited RUQ  . Urinalysis, Routine w reflex microscopic  . CBC with Differential/Platelet  . Comprehensive metabolic panel  . Amylase  . Lipase, blood  . Discharge patient   Meds ordered this encounter  Medications  . Prenatal MV-Min-FA-Omega-3  (PRENATAL GUMMIES/DHA & FA) 0.4-32.5 MG CHEW    Sig: Chew 2 each  by mouth daily.  . famotidine (PEPCID) 20 MG tablet    Sig: Take 1 tablet (20 mg total) by mouth 2 (two) times daily.    Dispense:  60 tablet    Refill:  3    Order Specific Question:   Supervising Provider    Answer:   Tilda BurrowFERGUSON, JOHN V [[id number]]    MDM - Upper abd pain of uncertain etiology. Concern for gallstones, but low suspicion for cholecystitis or obstruction due to absence of fever, leukocytosis or elevated lipase. Patient non-toxic-appearing. Stable for further evaluation outpatient. Instructed patient to take Pepcid 20 mg twice a day on schedule for the next week and avoid foods that might exacerbate gallbladder pain or reflux. No evidence of pregnancy complication.  ASSESSMENT 1. Abdominal pain, epigastric  2. Encounter for supervision of normal first pregnancy in first trimester   3. [redacted] weeks gestation of pregnancy     PLAN Discharge home in stable condition. Abd pain, cholcystitis precautions Discussed diet for bilary colic  Follow-up Information    [facility] Follow up.   Specialty:  Obstetrics and Gynecology Why:  As scheduled or sooner as needed if symptoms worsen Contact information: [address] [place] [id number] [phone]       [facility] up.   Specialty:  Radiology Why:  Will call you to schedule Gallbladder ultrasound Contact information: [address] [id number] [place] [id number] [phone]       [facility] Follow up.   Why:  As needed in emergencies (fever greater than 100.4, severe pain, severe nausea and vomiting) Contact information: [address] [id number] [place] [id number] [phone]         Allergies as of 03/24/2016      Reactions   Tussionex Pennkinetic Er [hydrocod Polst-cpm Polst Er]  Hives, Swelling, Other (See Comments)   Reaction:  Unspecified swelling reaction       Medication List    TAKE these medications   albuterol (2.5 MG/3ML) 0.083% nebulizer solution Commonly known as:  PROVENTIL Take 3 mLs (2.5 mg total) by nebulization every 4 (four) hours as needed for wheezing or shortness of breath.   albuterol 108 (90 Base) MCG/ACT inhaler Commonly known as:  PROVENTIL HFA;VENTOLIN HFA Inhale 1-2 puffs into the lungs every 6 (six) hours as needed for wheezing or shortness of breath.   famotidine 20 MG tablet Commonly known as:  PEPCID Take 1 tablet (20 mg total) by mouth 2 (two) times daily.   PRENATAL GUMMIES/DHA & FA 0.4-32.5 MG Chew Chew 2 each by mouth daily.        Carleton[place]ia Sydna Brodowski, CNM 03/24/2016  3:20 PM

## 2016-03-24 NOTE — MAU Note (Signed)
Pt C/O mid to upper abd pain started last night, is intermittent.  Pain is umbilicus & up.  Denies bleeding or LOF.  Denies vomiting, diarrhea, fever.

## 2016-03-26 ENCOUNTER — Encounter: Payer: Medicaid Other | Admitting: Obstetrics & Gynecology

## 2016-03-27 ENCOUNTER — Encounter: Payer: Self-pay | Admitting: Advanced Practice Midwife

## 2016-03-27 ENCOUNTER — Ambulatory Visit (HOSPITAL_COMMUNITY)
Admission: RE | Admit: 2016-03-27 | Discharge: 2016-03-27 | Disposition: A | Discharge status: Home / Self Care | Payer: Medicaid Other | Source: Ambulatory Visit | Attending: Advanced Practice Midwife | Admitting: Advanced Practice Midwife

## 2016-03-27 DIAGNOSIS — Z3A17 17 weeks gestation of pregnancy: Secondary | ICD-10-CM | POA: Diagnosis not present

## 2016-03-27 DIAGNOSIS — R1013 Epigastric pain: Secondary | ICD-10-CM | POA: Insufficient documentation

## 2016-03-27 DIAGNOSIS — O26892 Other specified pregnancy related conditions, second trimester: Secondary | ICD-10-CM | POA: Insufficient documentation

## 2016-03-27 DIAGNOSIS — R932 Abnormal findings on diagnostic imaging of liver and biliary tract: Secondary | ICD-10-CM | POA: Insufficient documentation

## 2016-03-27 LAB — MATERNAL SCREEN, INTEGRATED #2
ADSF: 0.93
AFP MARKER: 34.4 ng/mL
AFP MOM: 1.31
CROWN RUMP LENGTH: 67.2 mm
DIA MoM: 0.92
DIA Value: 140.2 pg/mL
Estriol, Unconjugated: 0.73 ng/mL
GEST. AGE ON COLLECTION DATE: 12.9 wk
GESTATIONAL AGE: 16.1 wk
HCG VALUE: 77 [IU]/mL
MATERNAL AGE AT EDD: 19.7 a
NUMBER OF FETUSES: 1
Nuchal Translucency (NT): 1.6 mm
Nuchal Translucency MoM: 0.96
PAPP-A MOM: 1.01
PAPP-A VALUE: 818.1 ng/mL
TEST RESULTS: NEGATIVE
WEIGHT: 192 [lb_av]
Weight: 192 [lb_av]
hCG MoM: 2.7

## 2016-04-02 ENCOUNTER — Telehealth: Payer: Self-pay | Admitting: *Deleted

## 2016-04-02 NOTE — Telephone Encounter (Signed)
LMOVM that per Mary Hull, CNM of Gall bladder sludge and possible stones on RUQ US. Recommend General Surgery consult. No other intervention needed now. Continue low fat/biliary colic diet to reduce symptoms. Advised patient to discuss with CNM at next visit.

## 2016-04-19 ENCOUNTER — Other Ambulatory Visit: Payer: Self-pay | Admitting: Obstetrics and Gynecology

## 2016-04-19 DIAGNOSIS — Z363 Encounter for antenatal screening for malformations: Secondary | ICD-10-CM

## 2016-04-22 ENCOUNTER — Ambulatory Visit (INDEPENDENT_AMBULATORY_CARE_PROVIDER_SITE_OTHER): Payer: Medicaid Other

## 2016-04-22 ENCOUNTER — Encounter: Payer: Self-pay | Admitting: Women's Health

## 2016-04-22 ENCOUNTER — Ambulatory Visit (INDEPENDENT_AMBULATORY_CARE_PROVIDER_SITE_OTHER): Payer: Medicaid Other | Admitting: Women's Health

## 2016-04-22 VITALS — BP 108/56 | HR 88 | Wt 189.0 lb

## 2016-04-22 DIAGNOSIS — Z3402 Encounter for supervision of normal first pregnancy, second trimester: Secondary | ICD-10-CM

## 2016-04-22 DIAGNOSIS — Z331 Pregnant state, incidental: Secondary | ICD-10-CM

## 2016-04-22 DIAGNOSIS — Z1389 Encounter for screening for other disorder: Secondary | ICD-10-CM

## 2016-04-22 DIAGNOSIS — Z363 Encounter for antenatal screening for malformations: Secondary | ICD-10-CM | POA: Diagnosis not present

## 2016-04-22 LAB — POCT URINALYSIS DIPSTICK
Blood, UA: NEGATIVE
Glucose, UA: NEGATIVE
Leukocytes, UA: NEGATIVE
Nitrite, UA: NEGATIVE
PROTEIN UA: NEGATIVE

## 2016-04-22 NOTE — Progress Notes (Signed)
Kore[place] 20+6 wks,cephalic,cx 4 cm,normal ov's bilat,ant pl gr 0,svp of fluid 4.3 cm,fhr 144 bpm,efw 385 g,anatomy complete,no obvious abnormalities seen

## 2016-04-22 NOTE — Progress Notes (Signed)
Low-risk OB appointment G1P0 1332w6d Estimated Date of Delivery: 09/03/16 BP (!) 108/56   Pulse 88   Wt 189 lb (85.7 kg)   LMP 11/28/2015 (Approximate)   BMI 33.48 kg/m   BP, weight, and urine reviewed.  Refer to obstetrical flow sheet for FH & FHR.  Reports good fm.  Denies regular uc's, lof, vb, or uti s/s. No complaints. Reviewed today's normal anatomy u/s, warning s/s to report, fm Plan:  Continue routine obstetrical care  F/U in 4wks for OB appointment

## 2016-04-22 NOTE — Patient Instructions (Addendum)
[place] Pediatricians/Family Doctors:  [facility] [phone]            [facility] [phone]                  [facility] [phone] (usually not accepting new patients unless you have family there already, you are always welcome to call and ask)            [place] Pediatricians/Family Doctors:   [facility]: [phone]  [facility]: [phone]   Second Trimester of Pregnancy The second trimester is from week 14 through week 27 (months 4 through 6). The second trimester is often a time when you feel your best. Your body has adjusted to being pregnant, and you begin to feel better physically. Usually, morning sickness has lessened or quit completely, you may have more energy, and you may have an increase in appetite. The second trimester is also a time when the fetus is growing rapidly. At the end of the sixth month, the fetus is about 9 inches long and weighs about 1 pounds. You will likely begin to feel the baby move (quickening) between 16 and 20 weeks of pregnancy. Body changes during your second trimester Your body continues to go through many changes during your second trimester. The changes vary from woman to woman.  Your weight will continue to increase. You will notice your lower abdomen bulging out.  You may begin to get stretch marks on your hips, abdomen, and breasts.  You may develop headaches that can be relieved by medicines. The medicines should be approved by your health care provider.  You may urinate more often because the fetus is pressing on your bladder.  You may develop or continue to have heartburn as a result of your pregnancy.  You may develop constipation because certain hormones are causing the muscles that push waste through your intestines to slow down.  You may develop hemorrhoids or swollen, bulging veins (varicose veins).  You may have back pain. This is caused by:  Weight  gain.  Pregnancy hormones that are relaxing the joints in your pelvis.  A shift in weight and the muscles that support your balance.  Your breasts will continue to grow and they will continue to become tender.  Your gums may bleed and may be sensitive to brushing and flossing.  Dark spots or blotches (chloasma, mask of pregnancy) may develop on your face. This will likely fade after the baby is born.  A dark line from your belly button to the pubic area (linea nigra) may appear. This will likely fade after the baby is born.  You may have changes in your hair. These can include thickening of your hair, rapid growth, and changes in texture. Some women also have hair loss during or after pregnancy, or hair that feels dry or thin. Your hair will most likely return to normal after your baby is born. What to expect at prenatal visits During a routine prenatal visit:  You will be weighed to make sure you and the fetus are growing normally.  Your blood pressure will be taken.  Your abdomen will be measured to track your baby's growth.  The fetal heartbeat will be listened to.  Any test results from the previous visit will be discussed. Your health care provider may ask you:  How you are feeling.  If you are feeling the baby move.  If you have had any abnormal symptoms, such as leaking fluid, bleeding, severe headaches, or abdominal   cramping.  If you are using any tobacco products, including cigarettes, chewing tobacco, and electronic cigarettes.  If you have any questions. Other tests that may be performed during your second trimester include:  Blood tests that check for:  Low iron levels (anemia).  High blood sugar that affects pregnant women (gestational diabetes) between 24 and 28 weeks.  Rh antibodies. This is to check for a protein on red blood cells (Rh factor).  Urine tests to check for infections, diabetes, or protein in the urine.  An ultrasound to confirm the  proper growth and development of the baby.  An amniocentesis to check for possible genetic problems.  Fetal screens for spina bifida and Down syndrome.  HIV (human immunodeficiency virus) testing. Routine prenatal testing includes screening for HIV, unless you choose not to have this test. Follow these instructions at home: Medicines   Follow your health care provider's instructions regarding medicine use. Specific medicines may be either safe or unsafe to take during pregnancy.  Take a prenatal vitamin that contains at least 600 micrograms (mcg) of folic acid.  If you develop constipation, try taking a stool softener if your health care provider approves. Eating and drinking   Eat a balanced diet that includes fresh fruits and vegetables, whole grains, good sources of protein such as meat, eggs, or tofu, and low-fat dairy. Your health care provider will help you determine the amount of weight gain that is right for you.  Avoid raw meat and uncooked cheese. These carry germs that can cause birth defects in the baby.  If you have low calcium intake from food, talk to your health care provider about whether you should take a daily calcium supplement.  Limit foods that are high in fat and processed sugars, such as fried and sweet foods.  To prevent constipation:  Drink enough fluid to keep your urine clear or pale yellow.  Eat foods that are high in fiber, such as fresh fruits and vegetables, whole grains, and beans. Activity   Exercise only as directed by your health care provider. Most women can continue their usual exercise routine during pregnancy. Try to exercise for 30 minutes at least 5 days a week. Stop exercising if you experience uterine contractions.  Avoid heavy lifting, wear low heel shoes, and practice good posture.  A sexual relationship may be continued unless your health care provider directs you otherwise. Relieving pain and discomfort   Wear a good support bra  to prevent discomfort from breast tenderness.  Take warm sitz baths to soothe any pain or discomfort caused by hemorrhoids. Use hemorrhoid cream if your health care provider approves.  Rest with your legs elevated if you have leg cramps or low back pain.  If you develop varicose veins, wear support hose. Elevate your feet for 15 minutes, 3-4 times a day. Limit salt in your diet. Prenatal Care   Write down your questions. Take them to your prenatal visits.  Keep all your prenatal visits as told by your health care provider. This is important. Safety   Wear your seat belt at all times when driving.  Make a list of emergency phone numbers, including numbers for family, friends, the hospital, and police and fire departments. General instructions   Ask your health care provider for a referral to a local prenatal education class. Begin classes no later than the beginning of month 6 of your pregnancy.  Ask for help if you have counseling or nutritional needs during pregnancy. Your health care provider   can offer advice or refer you to specialists for help with various needs.  Do not use hot tubs, steam rooms, or saunas.  Do not douche or use tampons or scented sanitary pads.  Do not cross your legs for long periods of time.  Avoid cat litter boxes and soil used by cats. These carry germs that can cause birth defects in the baby and possibly loss of the fetus by miscarriage or stillbirth.  Avoid all smoking, herbs, alcohol, and unprescribed drugs. Chemicals in these products can affect the formation and growth of the baby.  Do not use any products that contain nicotine or tobacco, such as cigarettes and e-cigarettes. If you need help quitting, ask your health care provider.  Visit your dentist if you have not gone yet during your pregnancy. Use a soft toothbrush to brush your teeth and be gentle when you floss. Contact a health care provider if:  You have dizziness.  You have mild  pelvic cramps, pelvic pressure, or nagging pain in the abdominal area.  You have persistent nausea, vomiting, or diarrhea.  You have a bad smelling vaginal discharge.  You have pain when you urinate. Get help right away if:  You have a fever.  You are leaking fluid from your vagina.  You have spotting or bleeding from your vagina.  You have severe abdominal cramping or pain.  You have rapid weight gain or weight loss.  You have shortness of breath with chest pain.  You notice sudden or extreme swelling of your face, hands, ankles, feet, or legs.  You have not felt your baby move in over an hour.  You have severe headaches that do not go away when you take medicine.  You have vision changes. Summary  The second trimester is from week 14 through week 27 (months 4 through 6). It is also a time when the fetus is growing rapidly.  Your body goes through many changes during pregnancy. The changes vary from woman to woman.  Avoid all smoking, herbs, alcohol, and unprescribed drugs. These chemicals affect the formation and growth your baby.  Do not use any tobacco products, such as cigarettes, chewing tobacco, and e-cigarettes. If you need help quitting, ask your health care provider.  Contact your health care provider if you have any questions. Keep all prenatal visits as told by your health care provider. This is important. This information is not intended to replace advice given to you by your health care provider. Make sure you discuss any questions you have with your health care provider. Document Released: 01/08/2001 Document Revised: 06/22/2015 Document Reviewed: 03/17/2012 Elsevier Interactive Patient Education  2017 [employer] Inc.  

## 2016-05-02 ENCOUNTER — Telehealth: Payer: Self-pay | Admitting: Advanced Practice Midwife

## 2016-05-02 NOTE — Telephone Encounter (Signed)
Pt states she gets dizzy spells sometimes and wanting to know if she should be concerned about it.  Pt denies feeling light headed or shaky, no vaginal bleeding and not cramping, she states she does have some allergy issues.  Advised pt to push water, take zyrtec for her allergies and if any new symptoms develop or dizziness gets worse call us back.  Pt also wanted to know what she can use for constipation.  Informed her to increase fiber in diet and can use Colace, Metamucil, Miralax or Ducolax.  Pt verbalized understanding.

## 2016-05-20 ENCOUNTER — Encounter: Payer: Self-pay | Admitting: Obstetrics and Gynecology

## 2016-05-20 ENCOUNTER — Ambulatory Visit (INDEPENDENT_AMBULATORY_CARE_PROVIDER_SITE_OTHER): Payer: Medicaid Other | Admitting: Obstetrics and Gynecology

## 2016-05-20 VITALS — BP 100/60 | HR 88 | Wt 193.0 lb

## 2016-05-20 DIAGNOSIS — Z331 Pregnant state, incidental: Secondary | ICD-10-CM

## 2016-05-20 DIAGNOSIS — Z1389 Encounter for screening for other disorder: Secondary | ICD-10-CM

## 2016-05-20 DIAGNOSIS — Z3402 Encounter for supervision of normal first pregnancy, second trimester: Secondary | ICD-10-CM

## 2016-05-20 LAB — POCT URINALYSIS DIPSTICK
Blood, UA: NEGATIVE
GLUCOSE UA: NEGATIVE
Leukocytes, UA: NEGATIVE
Nitrite, UA: NEGATIVE
Protein, UA: NEGATIVE

## 2016-05-20 NOTE — Patient Instructions (Signed)
(  [phone] is the phone number for Pregnancy Classes or hospital tours at [facility].  ° °You will be referred to  [email or web address]   for more information on childbirth classes   °At this site you may register for classes. You may sign up for a waiting list if classes are full. Please SIGN UP FOR THIS!.   When the waiting list becomes long, sometimes new classes can be added. ° ° ° °

## 2016-05-20 NOTE — Progress Notes (Signed)
Patient ID: Mary Hull, female   DOB: [birth year], 21 y.o.   MRN: [id number]  G1P0  Estimated Date of Delivery: 09/03/16 [facility] [redacted]w[redacted]d  Chief Complaint  Patient presents with  . Routine Prenatal Visit  ____  Patient complaints: none.  Patient reports good fetal movement. She denies any bleeding, rupture of membranes,or regular contractions.  Blood pressure 100/60, pulse 88, weight 193 lb (87.5 kg), last menstrual period 11/28/2015.   Urine results:notable for trace ketones  refer to the ob flow sheet for FH and FHR, ,                          Physical Examination: General appearance - alert, well appearing, and in no distress                                      Abdomen - FH 25 cm                                                        -FHR 140 bpm                                                         soft, nontender, nondistended, no masses or organomegaly                                            Questions were answered. Assessment: LROB G1P0 @ [redacted]w[redacted]d   Plan:  Continued routine obstetrical care  F/u in 4 weeks for routine prenatal care   By signing my name below, I, Doreatha Martin, attest that this documentation has been prepared under the direction and in the presence of Tilda Burrow, MD. Electronically Signed: Doreatha Martin, ED Scribe. 05/20/16. 4:38 PM.  I personally performed the services described in this documentation, which was SCRIBED in my presence. The recorded information has been reviewed and considered accurate. It has been edited as necessary during review. Tilda Burrow, MD

## 2016-06-08 ENCOUNTER — Inpatient Hospital Stay (HOSPITAL_COMMUNITY)
Admission: AD | Admit: 2016-06-08 | Discharge: 2016-06-08 | Disposition: A | Discharge status: Home / Self Care | Payer: Medicaid Other | Source: Ambulatory Visit | Attending: Obstetrics & Gynecology | Admitting: Obstetrics & Gynecology

## 2016-06-08 ENCOUNTER — Encounter (HOSPITAL_COMMUNITY): Payer: Self-pay | Admitting: *Deleted

## 2016-06-08 DIAGNOSIS — N949 Unspecified condition associated with female genital organs and menstrual cycle: Secondary | ICD-10-CM | POA: Diagnosis not present

## 2016-06-08 DIAGNOSIS — Z3A27 27 weeks gestation of pregnancy: Secondary | ICD-10-CM | POA: Diagnosis not present

## 2016-06-08 DIAGNOSIS — Z87891 Personal history of nicotine dependence: Secondary | ICD-10-CM | POA: Insufficient documentation

## 2016-06-08 DIAGNOSIS — M545 Low back pain: Secondary | ICD-10-CM | POA: Diagnosis present

## 2016-06-08 DIAGNOSIS — O9989 Other specified diseases and conditions complicating pregnancy, childbirth and the puerperium: Secondary | ICD-10-CM | POA: Diagnosis not present

## 2016-06-08 DIAGNOSIS — O26892 Other specified pregnancy related conditions, second trimester: Secondary | ICD-10-CM | POA: Diagnosis not present

## 2016-06-08 DIAGNOSIS — O99891 Other specified diseases and conditions complicating pregnancy: Secondary | ICD-10-CM

## 2016-06-08 DIAGNOSIS — M549 Dorsalgia, unspecified: Secondary | ICD-10-CM

## 2016-06-08 DIAGNOSIS — R102 Pelvic and perineal pain: Secondary | ICD-10-CM | POA: Insufficient documentation

## 2016-06-08 LAB — URINALYSIS, ROUTINE W REFLEX MICROSCOPIC
BILIRUBIN URINE: NEGATIVE
Glucose, UA: NEGATIVE mg/dL
KETONES UR: NEGATIVE mg/dL
Nitrite: NEGATIVE
PROTEIN: 100 mg/dL — AB
Specific Gravity, Urine: 1.009 (ref 1.005–1.030)
pH: 6 (ref 5.0–8.0)

## 2016-06-08 MED ORDER — CYCLOBENZAPRINE HCL 10 MG PO TABS
10.0000 mg | ORAL_TABLET | Freq: Once | ORAL | Status: AC
  Administered 2016-06-08: 10 mg via ORAL
  Filled 2016-06-08: qty 1

## 2016-06-08 MED ORDER — CYCLOBENZAPRINE HCL 10 MG PO TABS: 10.0000 mg | ORAL_TABLET | Freq: Two times a day (BID) | ORAL | 2 refills | Status: DC | PRN

## 2016-06-08 NOTE — Discharge Instructions (Signed)
Abdominal Pain During Pregnancy °Belly (abdominal) pain is common during pregnancy. Most of the time, it is not a serious problem. Other times, it can be a sign that something is wrong with the pregnancy. Always tell your doctor if you have belly pain. °Follow these instructions at home: °Monitor your belly pain for any changes. The following actions may help you feel better: °· Do not have sex (intercourse) or put anything in your vagina until you feel better. °· Rest until your pain stops. °· Drink clear fluids if you feel sick to your stomach (nauseous). Do not eat solid food until you feel better. °· Only take medicine as told by your doctor. °· Keep all doctor visits as told. °Get help right away if: °· You are bleeding, leaking fluid, or pieces of tissue come out of your vagina. °· You have more pain or cramping. °· You keep throwing up (vomiting). °· You have pain when you pee (urinate) or have blood in your pee. °· You have a fever. °· You do not feel your baby moving as much. °· You feel very weak or feel like passing out. °· You have trouble breathing, with or without belly pain. °· You have a very bad headache and belly pain. °· You have fluid leaking from your vagina and belly pain. °· You keep having watery poop (diarrhea). °· Your belly pain does not go away after resting, or the pain gets worse. °This information is not intended to replace advice given to you by your health care provider. Make sure you discuss any questions you have with your health care provider. °Document Released: 01/02/2009 Document Revised: 08/23/2015 Document Reviewed: 08/13/2012 °Elsevier Interactive Patient Education © 2017 Elsevier Inc. ° °

## 2016-06-08 NOTE — MAU Provider Note (Signed)
History   G1 @ 27.4 wks in with c/o low back and abd pain that started today. States had walked about a hour today and then started having sharp pain in lower abd both sides and low back. Denies ROM or vag bleeding.  CSN: [id number]  Arrival date & time 06/08/16  1913   None     Chief Complaint  Patient presents with  . Back Pain    HPI  Past Medical History:  Diagnosis Date  . Asthma     Past Surgical History:  Procedure Laterality Date  . FRACTURE SURGERY     L elbow  . TONSILLECTOMY      No family history on file.  Social History  Substance Use Topics  . Smoking status: Former Smoker    Years: 1.00    Types: Cigarettes  . Smokeless tobacco: Never Used  . Alcohol use No    OB History    Gravida Para Term Preterm AB Living   1             SAB TAB Ectopic Multiple Live Births                  Review of Systems  Constitutional: Negative.   HENT: Negative.   Eyes: Negative.   Respiratory: Negative.   Cardiovascular: Negative.   Gastrointestinal: Positive for abdominal pain.  Endocrine: Negative.   Genitourinary: Negative.   Musculoskeletal: Positive for back pain.  Skin: Negative.     Allergies  Tussionex pennkinetic er [hydrocod polst-cpm polst er]  Home Medications    BP 125/80 (BP Location: Right Arm)   Pulse (!) 107   Temp 97.8 F (36.6 C) (Oral)   Resp 18   LMP 11/28/2015 (Approximate)   Physical Exam  Constitutional: She is oriented to person, place, and time. She appears well-developed and well-nourished.  HENT:  Head: Normocephalic.  Eyes: Pupils are equal, round, and reactive to light.  Neck: Normal range of motion.  Cardiovascular: Normal rate, regular rhythm, normal heart sounds and intact distal pulses.   Pulmonary/Chest: Effort normal and breath sounds normal.  Abdominal: Soft. Bowel sounds are normal.  Genitourinary: Vagina normal and uterus normal.  Musculoskeletal: Normal range of motion.  Neurological: She is alert and  oriented to person, place, and time. She has normal reflexes.  Skin: Skin is warm and dry.  Psychiatric: She has a normal mood and affect. Her behavior is normal. Judgment and thought content normal.    MAU Course  Procedures (including critical care time)  Labs Reviewed  URINALYSIS, ROUTINE W REFLEX MICROSCOPIC - Abnormal; Notable for the following:       Result Value   Hgb urine dipstick LARGE (*)    Protein, ur 100 (*)    Leukocytes, UA TRACE (*)    Bacteria, UA RARE (*)    Squamous Epithelial / LPF 0-5 (*)    All other components within normal limits   No results found.   No diagnosis found. Round ligament pain   MDM  VSS, SVE cl/th/post/high. FHR pattern reassuring . No uc's. Will get reactive FHR pattern and d/c home

## 2016-06-08 NOTE — MAU Note (Signed)
Pt presents to MAU c/o back pain and left abdominal pain that started this morning and has progressed into the evening. Pt states good fetal movement and denies LOF or bleeding.

## 2016-06-17 ENCOUNTER — Encounter: Payer: Self-pay | Admitting: Women's Health

## 2016-06-17 ENCOUNTER — Ambulatory Visit (INDEPENDENT_AMBULATORY_CARE_PROVIDER_SITE_OTHER): Payer: Medicaid Other | Admitting: Women's Health

## 2016-06-17 VITALS — BP 126/80 | HR 110 | Wt 199.0 lb

## 2016-06-17 DIAGNOSIS — Z331 Pregnant state, incidental: Secondary | ICD-10-CM

## 2016-06-17 DIAGNOSIS — Z1389 Encounter for screening for other disorder: Secondary | ICD-10-CM

## 2016-06-17 DIAGNOSIS — Z3403 Encounter for supervision of normal first pregnancy, third trimester: Secondary | ICD-10-CM

## 2016-06-17 LAB — POCT URINALYSIS DIPSTICK
Blood, UA: NEGATIVE
Glucose, UA: NEGATIVE
Ketones, UA: NEGATIVE
NITRITE UA: NEGATIVE
PROTEIN UA: NEGATIVE

## 2016-06-17 NOTE — Patient Instructions (Addendum)
You will have your sugar test next visit.  Please do not eat or drink anything after midnight the night before you come, not even water.  You will be here for at least two hours.     Call the office [phone]) or go to [facility] if:  You begin to have strong, frequent contractions  Your water breaks.  Sometimes it is a big gush of fluid, sometimes it is just a trickle that keeps getting your panties wet or running down your legs  You have vaginal bleeding.  It is normal to have a small amount of spotting if your cervix was checked.   You don't feel your baby moving like normal.  If you don't, get you something to eat and drink and lay down and focus on feeling your baby move.  You should feel at least 10 movements in 2 hours.  If you don't, you should call the office or go to [facility].    Tdap Vaccine  It is recommended that you get the Tdap vaccine during the third trimester of EACH pregnancy to help protect your baby from getting pertussis (whooping cough)  27-36 weeks is the BEST time to do this so that you can pass the protection on to your baby. During pregnancy is better than after pregnancy, but if you are unable to get it during pregnancy it will be offered at the hospital.   You can get this vaccine at the health department or your family doctor  Everyone who will be around your baby should also be up-to-date on their vaccines. Adults (who are not pregnant) only need 1 dose of Tdap during adulthood.    Pediatricians/Family Doctors:  [facility] [phone]            [facility] [phone]                 [facility] [phone] (usually not accepting new patients unless you have family there already, you are always welcome to call and ask)            [facility] Pediatricians/Family Doctors:   [facility]: [phone]  [facility]: [phone]    Third Trimester of Pregnancy The  third trimester is from week 29 through week 42, months 7 through 9. The third trimester is a time when the fetus is growing rapidly. At the end of the ninth month, the fetus is about 20 inches in length and weighs 6-10 pounds.  BODY CHANGES Your body goes through many changes during pregnancy. The changes vary from woman to woman.   Your weight will continue to increase. You can expect to gain 25-35 pounds (11-16 kg) by the end of the pregnancy.  You may begin to get stretch marks on your hips, abdomen, and breasts.  You may urinate more often because the fetus is moving lower into your pelvis and pressing on your bladder.  You may develop or continue to have heartburn as a result of your pregnancy.  You may develop constipation because certain hormones are causing the muscles that push waste through your intestines to slow down.  You may develop hemorrhoids or swollen, bulging veins (varicose veins).  You may have pelvic pain because of the weight gain and pregnancy hormones relaxing your joints between the bones in your pelvis. Backaches may result from overexertion of the muscles supporting your posture.  You may have changes in your hair. These can include thickening of your hair, rapid growth, and changes  in texture. Some women also have hair loss during or after pregnancy, or hair that feels dry or thin. Your hair will most likely return to normal after your baby is born.  Your breasts will continue to grow and be tender. A yellow discharge may leak from your breasts called colostrum.  Your belly button may stick out.  You may feel short of breath because of your expanding uterus.  You may notice the fetus "dropping," or moving lower in your abdomen.  You may have a bloody mucus discharge. This usually occurs a few days to a week before labor begins.  Your cervix becomes thin and soft (effaced) near your due date. WHAT TO EXPECT AT YOUR PRENATAL EXAMS  You will have prenatal  exams every 2 weeks until week 36. Then, you will have weekly prenatal exams. During a routine prenatal visit:  You will be weighed to make sure you and the fetus are growing normally.  Your blood pressure is taken.  Your abdomen will be measured to track your baby's growth.  The fetal heartbeat will be listened to.  Any test results from the previous visit will be discussed.  You may have a cervical check near your due date to see if you have effaced. At around 36 weeks, your caregiver will check your cervix. At the same time, your caregiver will also perform a test on the secretions of the vaginal tissue. This test is to determine if a type of bacteria, Group B streptococcus, is present. Your caregiver will explain this further. Your caregiver may ask you:  What your birth plan is.  How you are feeling.  If you are feeling the baby move.  If you have had any abnormal symptoms, such as leaking fluid, bleeding, severe headaches, or abdominal cramping.  If you have any questions. Other tests or screenings that may be performed during your third trimester include:  Blood tests that check for low iron levels (anemia).  Fetal testing to check the health, activity level, and growth of the fetus. Testing is done if you have certain medical conditions or if there are problems during the pregnancy. FALSE LABOR You may feel small, irregular contractions that eventually go away. These are called Braxton Hicks contractions, or false labor. Contractions may last for hours, days, or even weeks before true labor sets in. If contractions come at regular intervals, intensify, or become painful, it is best to be seen by your caregiver.  SIGNS OF LABOR   Menstrual-like cramps.  Contractions that are 5 minutes apart or less.  Contractions that start on the top of the uterus and spread down to the lower abdomen and back.  A sense of increased pelvic pressure or back pain.  A watery or bloody  mucus discharge that comes from the vagina. If you have any of these signs before the 37th week of pregnancy, call your caregiver right away. You need to go to the hospital to get checked immediately. HOME CARE INSTRUCTIONS   Avoid all smoking, herbs, alcohol, and unprescribed drugs. These chemicals affect the formation and growth of the baby.  Follow your caregiver's instructions regarding medicine use. There are medicines that are either safe or unsafe to take during pregnancy.  Exercise only as directed by your caregiver. Experiencing uterine cramps is a good sign to stop exercising.  Continue to eat regular, healthy meals.  Wear a good support bra for breast tenderness.  Do not use hot tubs, steam rooms, or saunas.  Wear your   seat belt at all times when driving.  Avoid raw meat, uncooked cheese, cat litter boxes, and soil used by cats. These carry germs that can cause birth defects in the baby.  Take your prenatal vitamins.  Try taking a stool softener (if your caregiver approves) if you develop constipation. Eat more high-fiber foods, such as fresh vegetables or fruit and whole grains. Drink plenty of fluids to keep your urine clear or pale yellow.  Take warm sitz baths to soothe any pain or discomfort caused by hemorrhoids. Use hemorrhoid cream if your caregiver approves.  If you develop varicose veins, wear support hose. Elevate your feet for 15 minutes, 3-4 times a day. Limit salt in your diet.  Avoid heavy lifting, wear low heal shoes, and practice good posture.  Rest a lot with your legs elevated if you have leg cramps or low back pain.  Visit your dentist if you have not gone during your pregnancy. Use a soft toothbrush to brush your teeth and be gentle when you floss.  A sexual relationship may be continued unless your caregiver directs you otherwise.  Do not travel far distances unless it is absolutely necessary and only with the approval of your caregiver.  Take  prenatal classes to understand, practice, and ask questions about the labor and delivery.  Make a trial run to the hospital.  Pack your hospital bag.  Prepare the baby's nursery.  Continue to go to all your prenatal visits as directed by your caregiver. SEEK MEDICAL CARE IF:  You are unsure if you are in labor or if your water has broken.  You have dizziness.  You have mild pelvic cramps, pelvic pressure, or nagging pain in your abdominal area.  You have persistent nausea, vomiting, or diarrhea.  You have a bad smelling vaginal discharge.  You have pain with urination. SEEK IMMEDIATE MEDICAL CARE IF:   You have a fever.  You are leaking fluid from your vagina.  You have spotting or bleeding from your vagina.  You have severe abdominal cramping or pain.  You have rapid weight loss or gain.  You have shortness of breath with chest pain.  You notice sudden or extreme swelling of your face, hands, ankles, feet, or legs.  You have not felt your baby move in over an hour.  You have severe headaches that do not go away with medicine.  You have vision changes. Document Released: 01/08/2001 Document Revised: 01/19/2013 Document Reviewed: 03/17/2012 [facility] Patient Information 2015 [employer], [place]. This information is not intended to replace advice given to you by your health care provider. Make sure you discuss any questions you have with your health care provider.

## 2016-06-17 NOTE — Progress Notes (Signed)
Low-risk OB appointment G1P0 3233w6d Estimated Date of Delivery: 09/03/16 BP 126/80   Pulse (!) 110   Wt 199 lb (90.3 kg)   LMP 11/28/2015 (Approximate)   BMI 35.25 kg/m   BP, weight, and urine reviewed.  Refer to obstetrical flow sheet for FH & FHR.  Reports good fm.  Denies regular uc's, lof, vb, or uti s/s. No complaints. Was not scheduled for pn2.  Reviewed ptl s/s, fkc. Recommended Tdap at HD/PCP per CDC guidelines.  Plan:  Continue routine obstetrical care  F/U asap for pn2 (no visit), then 4wks for OB appointment

## 2016-06-19 ENCOUNTER — Other Ambulatory Visit: Payer: Medicaid Other

## 2016-06-19 DIAGNOSIS — Z131 Encounter for screening for diabetes mellitus: Secondary | ICD-10-CM

## 2016-06-19 DIAGNOSIS — Z3403 Encounter for supervision of normal first pregnancy, third trimester: Secondary | ICD-10-CM

## 2016-06-19 DIAGNOSIS — Z3A29 29 weeks gestation of pregnancy: Secondary | ICD-10-CM

## 2016-06-20 ENCOUNTER — Telehealth: Payer: Self-pay | Admitting: Women's Health

## 2016-06-20 DIAGNOSIS — O24410 Gestational diabetes mellitus in pregnancy, diet controlled: Secondary | ICD-10-CM

## 2016-06-20 DIAGNOSIS — O0990 Supervision of high risk pregnancy, unspecified, unspecified trimester: Secondary | ICD-10-CM

## 2016-06-20 DIAGNOSIS — Z8632 Personal history of gestational diabetes: Secondary | ICD-10-CM | POA: Insufficient documentation

## 2016-06-20 LAB — CBC
Hematocrit: 35.4 % (ref 34.0–46.6)
Hemoglobin: 11.7 g/dL (ref 11.1–15.9)
MCH: 29 pg (ref 26.6–33.0)
MCHC: 33.1 g/dL (ref 31.5–35.7)
MCV: 88 fL (ref 79–97)
PLATELETS: 257 10*3/uL (ref 150–379)
RBC: 4.03 x10E6/uL (ref 3.77–5.28)
RDW: 13.8 % (ref 12.3–15.4)
WBC: 9.5 10*3/uL (ref 3.4–10.8)

## 2016-06-20 LAB — RPR: RPR Ser Ql: NONREACTIVE

## 2016-06-20 LAB — ANTIBODY SCREEN: ANTIBODY SCREEN: NEGATIVE

## 2016-06-20 LAB — GLUCOSE TOLERANCE, 2 HOURS W/ 1HR
GLUCOSE, FASTING: 93 mg/dL — AB (ref 65–91)
Glucose, 1 hour: 101 mg/dL (ref 65–179)
Glucose, 2 hour: 118 mg/dL (ref 65–152)

## 2016-06-20 LAB — HIV ANTIBODY (ROUTINE TESTING W REFLEX): HIV Screen 4th Generation wRfx: NONREACTIVE

## 2016-06-20 NOTE — Telephone Encounter (Signed)
LM for pt to return call. Need to notify of GDM, expect call from dietician.  Cheral MarkerKimberly R. Jamario Colina, CNM, [facility]WHNP-BC 06/20/2016 12:52 PM

## 2016-06-26 ENCOUNTER — Telehealth: Payer: Self-pay | Admitting: Women's Health

## 2016-06-26 NOTE — Telephone Encounter (Signed)
2nd attempt- LM w/ pt, notified her of GDM, expect call from dietician, will be checking cbg's QID, bring log to each appt. Asked her to call back to let me know she got message. Cheral MarkerKimberly R. Booker, CNM, [facility]NP-BC 06/26/2016 12:45 PM

## 2016-06-28 ENCOUNTER — Telehealth: Payer: Self-pay | Admitting: Women's Health

## 2016-06-28 NOTE — Telephone Encounter (Signed)
3rd attempt to contact to notify of GDM dx. LM on voice mail to call us back.  Cheral MarkerKimberly R. Jaely Silman, CNM, WHNP-BC 06/28/2016 11:03 AM

## 2016-07-15 ENCOUNTER — Encounter: Payer: Self-pay | Admitting: Obstetrics & Gynecology

## 2016-07-15 ENCOUNTER — Ambulatory Visit (INDEPENDENT_AMBULATORY_CARE_PROVIDER_SITE_OTHER): Payer: Medicaid Other | Admitting: Obstetrics & Gynecology

## 2016-07-15 VITALS — BP 100/60 | HR 88 | Wt 206.0 lb

## 2016-07-15 DIAGNOSIS — Z3403 Encounter for supervision of normal first pregnancy, third trimester: Secondary | ICD-10-CM

## 2016-07-15 DIAGNOSIS — Z331 Pregnant state, incidental: Secondary | ICD-10-CM

## 2016-07-15 DIAGNOSIS — Z1389 Encounter for screening for other disorder: Secondary | ICD-10-CM

## 2016-07-15 LAB — POCT URINALYSIS DIPSTICK
Blood, UA: NEGATIVE
GLUCOSE UA: NEGATIVE
KETONES UA: NEGATIVE
Leukocytes, UA: NEGATIVE
NITRITE UA: NEGATIVE
Protein, UA: NEGATIVE

## 2016-07-15 NOTE — Progress Notes (Signed)
G1P0 8912w6d Estimated Date of Delivery: 09/03/16  Blood pressure 100/60, pulse 88, weight 206 lb (93.4 kg), last menstrual period 11/28/2015.   BP weight and urine results all reviewed and noted.  Please refer to the obstetrical flow sheet for the fundal height and fetal heart rate documentation:  Patient reports good fetal movement, denies any bleeding and no rupture of membranes symptoms or regular contractions. Patient is without complaints. All questions were answered.  Orders Placed This Encounter  Procedures  . Glucose  . POCT urinalysis dipstick    Plan:  Continued routine obstetrical care, Pt ate some [employer] cookies about 2230 the night before her 2 hour test, Fasting is 93 which would be normal for the 3 hour, her 1 and 2 hour glucose load blood sugars are excellent so if she passes this fasting  I will not treat her as a gestational diabetic but will do 36 week EFW sonogram  Return in about 1 week (around 07/22/2016) for LROB, with Dr Despina HiddenEure.

## 2016-07-17 LAB — GLUCOSE, RANDOM: Glucose: 94 mg/dL (ref 65–99)

## 2016-07-23 ENCOUNTER — Ambulatory Visit (INDEPENDENT_AMBULATORY_CARE_PROVIDER_SITE_OTHER): Payer: Medicaid Other | Admitting: Obstetrics & Gynecology

## 2016-07-23 VITALS — BP 116/74 | HR 108 | Wt 204.0 lb

## 2016-07-23 DIAGNOSIS — Z3403 Encounter for supervision of normal first pregnancy, third trimester: Secondary | ICD-10-CM

## 2016-07-23 DIAGNOSIS — Z331 Pregnant state, incidental: Secondary | ICD-10-CM

## 2016-07-23 DIAGNOSIS — Z1389 Encounter for screening for other disorder: Secondary | ICD-10-CM

## 2016-07-23 LAB — POCT URINALYSIS DIPSTICK
Glucose, UA: NEGATIVE
Ketones, UA: NEGATIVE
Nitrite, UA: NEGATIVE

## 2016-07-23 NOTE — Progress Notes (Signed)
G1P0 1130w0d Estimated Date of Delivery: 09/03/16  Blood pressure 116/74, pulse (!) 108, weight 204 lb (92.5 kg), last menstrual period 11/28/2015.   BP weight and urine results all reviewed and noted.  Please refer to the obstetrical flow sheet for the fundal height and fetal heart rate documentation:  Patient reports good fetal movement, denies any bleeding and no rupture of membranes symptoms or regular contractions. Patient is without complaints. All questions were answered.  Orders Placed This Encounter  Procedures  . POCT urinalysis dipstick    Plan:  Continued routine obstetrical care, fasting blood sugar still in the same range will continue to monitor Recheck fasting blood sugar  No Follow-up on file.

## 2016-07-30 ENCOUNTER — Ambulatory Visit (INDEPENDENT_AMBULATORY_CARE_PROVIDER_SITE_OTHER): Payer: Medicaid Other | Admitting: Obstetrics and Gynecology

## 2016-07-30 ENCOUNTER — Encounter: Payer: Self-pay | Admitting: Obstetrics and Gynecology

## 2016-07-30 ENCOUNTER — Other Ambulatory Visit: Payer: Medicaid Other

## 2016-07-30 VITALS — BP 90/60 | HR 82 | Wt 204.0 lb

## 2016-07-30 DIAGNOSIS — Z1389 Encounter for screening for other disorder: Secondary | ICD-10-CM

## 2016-07-30 DIAGNOSIS — O09893 Supervision of other high risk pregnancies, third trimester: Secondary | ICD-10-CM

## 2016-07-30 DIAGNOSIS — Z331 Pregnant state, incidental: Secondary | ICD-10-CM

## 2016-07-30 DIAGNOSIS — O99810 Abnormal glucose complicating pregnancy: Secondary | ICD-10-CM

## 2016-07-30 DIAGNOSIS — Z3A35 35 weeks gestation of pregnancy: Secondary | ICD-10-CM

## 2016-07-30 DIAGNOSIS — O0990 Supervision of high risk pregnancy, unspecified, unspecified trimester: Secondary | ICD-10-CM

## 2016-07-30 DIAGNOSIS — O24410 Gestational diabetes mellitus in pregnancy, diet controlled: Secondary | ICD-10-CM

## 2016-07-30 LAB — POCT URINALYSIS DIPSTICK
GLUCOSE UA: NEGATIVE
Ketones, UA: NEGATIVE
LEUKOCYTES UA: NEGATIVE
NITRITE UA: NEGATIVE
Protein, UA: NEGATIVE
RBC UA: NEGATIVE

## 2016-07-30 NOTE — Patient Instructions (Signed)
(  [phone] is the phone number for Pregnancy Classes or hospital tours at [facility].  ° °You will be referred to  [email or web address]   for more information on childbirth classes   °At this site you may register for classes. You may sign up for a waiting list if classes are full. Please SIGN UP FOR THIS!.   When the waiting list becomes long, sometimes new classes can be added. ° ° ° °

## 2016-07-30 NOTE — Progress Notes (Signed)
G1P0  Estimated Date of Delivery: 09/03/16 LROB 4464w0d GDM A1 Has had one abnormal value, the fasting which was 93 at 28 weeks, 94 on 6/19 2018, in being tested again today results are pending Chief Complaint  Patient presents with  . Routine Prenatal Visit    PN2  Review of blood sugars__fasting blood glucose this morning is pending. The patient's had 2 borderline fasting blood sugars, at 28 weeks and then at 32 weeks_  Patient complaints: None. Patient reports   good fetal movement,                           denies any bleeding , rupture of membranes,or regular contractions.  Blood pressure 90/60, pulse 82, weight 204 lb (92.5 kg), last menstrual period 11/28/2015.   Urine results:notable for negative for glucose, protein refer to the ob flow sheet for FH and FHR, ,                          Physical Examination: General appearance - alert, well appearing, and in no distress and normal appearing weight                                      Abdomen - FH 34 ,                                                         -FHR 138                                                                                               Pelvic -                                             Questions were answered. Assessment: LROB G1P0 @ 6664w0d  recheck a baseline fasting CBG Encouraged to tour the hospital.  Plan:  Continued routine obstetrical care, if elevated fasting sugar will begin fingerstick monitoring  F/u in 1 weeks for LROB

## 2016-07-31 LAB — GLUCOSE, RANDOM: GLUCOSE: 96 mg/dL (ref 65–99)

## 2016-08-06 ENCOUNTER — Encounter: Payer: Self-pay | Admitting: Obstetrics & Gynecology

## 2016-08-06 ENCOUNTER — Ambulatory Visit (INDEPENDENT_AMBULATORY_CARE_PROVIDER_SITE_OTHER): Payer: Medicaid Other | Admitting: Obstetrics & Gynecology

## 2016-08-06 VITALS — BP 100/70 | HR 78 | Wt 206.0 lb

## 2016-08-06 DIAGNOSIS — Z1389 Encounter for screening for other disorder: Secondary | ICD-10-CM

## 2016-08-06 DIAGNOSIS — O3663X0 Maternal care for excessive fetal growth, third trimester, not applicable or unspecified: Secondary | ICD-10-CM

## 2016-08-06 DIAGNOSIS — R739 Hyperglycemia, unspecified: Secondary | ICD-10-CM

## 2016-08-06 DIAGNOSIS — Z331 Pregnant state, incidental: Secondary | ICD-10-CM

## 2016-08-06 DIAGNOSIS — Z3403 Encounter for supervision of normal first pregnancy, third trimester: Secondary | ICD-10-CM

## 2016-08-06 DIAGNOSIS — Z3A36 36 weeks gestation of pregnancy: Secondary | ICD-10-CM

## 2016-08-06 DIAGNOSIS — O99810 Abnormal glucose complicating pregnancy: Secondary | ICD-10-CM

## 2016-08-06 LAB — POCT URINALYSIS DIPSTICK
Glucose, UA: NEGATIVE
Ketones, UA: NEGATIVE
LEUKOCYTES UA: NEGATIVE
NITRITE UA: NEGATIVE
RBC UA: NEGATIVE

## 2016-08-06 NOTE — Progress Notes (Signed)
G1P0 7577w0d Estimated Date of Delivery: 09/03/16  Blood pressure 100/70, pulse 78, weight 206 lb (93.4 kg), last menstrual period 11/28/2015.   BP weight and urine results all reviewed and noted.  Please refer to the obstetrical flow sheet for the fundal height and fetal heart rate documentation:  Patient reports good fetal movement, denies any bleeding and no rupture of membranes symptoms or regular contractions. Patient is without complaints. All questions were answered.  Orders Placed This Encounter  Procedures  . US OB Follow Up  . Glucose  . POCT urinalysis dipstick    Plan:  Continued routine obstetrical care, pt still with slightly elevated fasting x1 but good response to glucose loads, so I am considering her borderline gestational diabetic, will check R=EFW next week + fasting  Return in about 1 week (around 08/13/2016) for sonogram EFW, LROB, with Dr Despina HiddenEure + fasting blood sugar.

## 2016-08-14 ENCOUNTER — Encounter: Payer: Self-pay | Admitting: Advanced Practice Midwife

## 2016-08-14 ENCOUNTER — Other Ambulatory Visit: Payer: Medicaid Other

## 2016-08-14 ENCOUNTER — Ambulatory Visit (INDEPENDENT_AMBULATORY_CARE_PROVIDER_SITE_OTHER): Payer: Medicaid Other

## 2016-08-14 ENCOUNTER — Ambulatory Visit (INDEPENDENT_AMBULATORY_CARE_PROVIDER_SITE_OTHER): Payer: Medicaid Other | Admitting: Advanced Practice Midwife

## 2016-08-14 VITALS — BP 108/64 | HR 73 | Wt 207.0 lb

## 2016-08-14 DIAGNOSIS — Z1389 Encounter for screening for other disorder: Secondary | ICD-10-CM

## 2016-08-14 DIAGNOSIS — Z3403 Encounter for supervision of normal first pregnancy, third trimester: Secondary | ICD-10-CM

## 2016-08-14 DIAGNOSIS — O24410 Gestational diabetes mellitus in pregnancy, diet controlled: Secondary | ICD-10-CM

## 2016-08-14 DIAGNOSIS — O3663X0 Maternal care for excessive fetal growth, third trimester, not applicable or unspecified: Secondary | ICD-10-CM | POA: Diagnosis not present

## 2016-08-14 DIAGNOSIS — O0990 Supervision of high risk pregnancy, unspecified, unspecified trimester: Secondary | ICD-10-CM

## 2016-08-14 DIAGNOSIS — Z331 Pregnant state, incidental: Secondary | ICD-10-CM

## 2016-08-14 DIAGNOSIS — Z3A37 37 weeks gestation of pregnancy: Secondary | ICD-10-CM

## 2016-08-14 LAB — POCT URINALYSIS DIPSTICK
GLUCOSE UA: NEGATIVE
KETONES UA: NEGATIVE
Nitrite, UA: NEGATIVE

## 2016-08-14 LAB — OB RESULTS CONSOLE GBS: STREP GROUP B AG: NEGATIVE

## 2016-08-14 LAB — GLUCOSE, POCT (MANUAL RESULT ENTRY): POC GLUCOSE: 99 mg/dL (ref 70–99)

## 2016-08-14 NOTE — Progress Notes (Signed)
US 37+1 wks,cephalic,cx 3.7 cm,ant pl gr 1,afi 10.5 cm,normal ovaries bilat,fhr 142 bpm,efw 3213 g 60.6%

## 2016-08-14 NOTE — Addendum Note (Signed)
Addended by: Tish FredericksonLANCASTER, Pang Robers A on: 08/14/2016 11:20 AM   Modules accepted: Orders

## 2016-08-14 NOTE — Progress Notes (Addendum)
G1P0 304w1d Estimated Date of Delivery: 09/03/16  Blood pressure 108/64, pulse 73, weight 207 lb (93.9 kg), last menstrual period 11/28/2015.   BP weight and urine results all reviewed and noted.  US 37+1 wks,cephalic,cx 3.7 cm,ant pl gr 1,afi 10.5 cm,normal ovaries bilat,fhr 142 bpm,efw 3213 g 60.6%   Please refer to the obstetrical flow sheet for the fundal height and fetal heart rate documentation: FBS 99.  Will start QID testing as pt may need meds.  Also, carb counseling given (states all she knows to avoid is sugar).   Patient reports good fetal movement, denies any bleeding and no rupture of membranes symptoms or regular contractions. Patient is without complaints. All questions were answered.  Orders Placed This Encounter  Procedures  . Culture, beta strep (group b only)  . GC/Chlamydia Probe Amp  . POCT urinalysis dipstick    Plan:  Continued routine obstetrical care, Start QID testing   No Follow-up on file.

## 2016-08-14 NOTE — Patient Instructions (Signed)
Check BS Fasting and 2 hours after each meal.  Goals are <92 Fasting and <120 after meals.  Write everything down and bring to every visit. Let me know sooner if BSs are out of range.    Research foods that are "low carb" for things you can eat and foods that you need to limit.  If you notice that you have a response to a certain food that raises your sugar, avoid that particular food.

## 2016-08-15 LAB — GLUCOSE, RANDOM: Glucose: 87 mg/dL (ref 65–99)

## 2016-08-17 LAB — GC/CHLAMYDIA PROBE AMP
CHLAMYDIA, DNA PROBE: NEGATIVE
NEISSERIA GONORRHOEAE BY PCR: NEGATIVE

## 2016-08-18 LAB — CULTURE, BETA STREP (GROUP B ONLY): Strep Gp B Culture: NEGATIVE

## 2016-08-21 ENCOUNTER — Encounter: Payer: Self-pay | Admitting: Advanced Practice Midwife

## 2016-08-21 ENCOUNTER — Ambulatory Visit (INDEPENDENT_AMBULATORY_CARE_PROVIDER_SITE_OTHER): Payer: Medicaid Other | Admitting: Advanced Practice Midwife

## 2016-08-21 VITALS — BP 110/72 | HR 80 | Wt 208.0 lb

## 2016-08-21 DIAGNOSIS — Z3A38 38 weeks gestation of pregnancy: Secondary | ICD-10-CM

## 2016-08-21 DIAGNOSIS — O09893 Supervision of other high risk pregnancies, third trimester: Secondary | ICD-10-CM

## 2016-08-21 DIAGNOSIS — Z331 Pregnant state, incidental: Secondary | ICD-10-CM

## 2016-08-21 DIAGNOSIS — O0993 Supervision of high risk pregnancy, unspecified, third trimester: Secondary | ICD-10-CM

## 2016-08-21 DIAGNOSIS — Z1389 Encounter for screening for other disorder: Secondary | ICD-10-CM

## 2016-08-21 DIAGNOSIS — O24419 Gestational diabetes mellitus in pregnancy, unspecified control: Secondary | ICD-10-CM

## 2016-08-21 LAB — POCT URINALYSIS DIPSTICK
Glucose, UA: NEGATIVE
NITRITE UA: NEGATIVE

## 2016-08-21 NOTE — Progress Notes (Signed)
Fetal Surveillance Testing today:  none   High Risk Pregnancy Diagnosis(es):   A1DM, just now starting to check BS per instruction  G1P0 [redacted]w[redacted]d Estimated Date of Delivery: 09/03/16  Blood pressure 110/72, pulse 80, weight 94.3 kg (208 lb), last menstrual period 11/28/2015.  Urinalysis: Negative   HPI: The patient is being seen today for ongoing management of the above. Today she reports FBS:  99/97/95/88/86/88/93  2 hour pp only 3 abnormal (122-130).     BP weight and urine results all reviewed and noted. Patient reports good fetal movement, denies any bleeding and no rupture of membranes symptoms or regular contractions.  Fundal Height:  37 Fetal Heart rate:  131 Edema:  trace  Patient is without complaints other than noted in her HPI. All questions were answered.  All lab and sonogram results have been reviewed. Comments:  Is now better able to see what foods are making her sugars elevated, so sugars are doing better.  Will not start meds.   Assessment:  1.  Pregnancy at [redacted]w[redacted]d,  Estimated Date of Delivery: 09/03/16 :                          2.  A1DM, decent control w/diest changes                        3.    Medication(s) Plans:  none  Treatment Plan:  Continue QID blood sugar testing,, IOL 40 weeks   No Follow-up on file. for appointment for high risk OB care  Meds ordered this encounter  Medications  . Blood Glucose Monitoring Suppl (ACCU-CHEK AVIVA PLUS) w/Device KIT    Sig: USE FOR QID TESTING    Refill:  0  . ACCU-CHEK AVIVA PLUS test strip    Sig: USE FOR QID TESTING    Refill:  0  . ACCU-CHEK SOFTCLIX LANCETS lancets    Sig: USE FOR QID TESTING    Refill:  0   Orders Placed This Encounter  Procedures  . POCT urinalysis dipstick    

## 2016-08-29 ENCOUNTER — Telehealth (HOSPITAL_COMMUNITY): Payer: Self-pay | Admitting: *Deleted

## 2016-08-29 ENCOUNTER — Ambulatory Visit (INDEPENDENT_AMBULATORY_CARE_PROVIDER_SITE_OTHER): Payer: Medicaid Other | Admitting: Women's Health

## 2016-08-29 ENCOUNTER — Inpatient Hospital Stay (HOSPITAL_COMMUNITY)
Admission: AD | Admit: 2016-08-29 | Discharge: 2016-08-29 | Disposition: A | Discharge status: Home / Self Care | Payer: Medicaid Other | Source: Ambulatory Visit | Attending: Obstetrics & Gynecology | Admitting: Obstetrics & Gynecology

## 2016-08-29 ENCOUNTER — Encounter (HOSPITAL_COMMUNITY): Payer: Self-pay | Admitting: *Deleted

## 2016-08-29 ENCOUNTER — Encounter: Payer: Self-pay | Admitting: Women's Health

## 2016-08-29 ENCOUNTER — Telehealth: Payer: Self-pay | Admitting: Women's Health

## 2016-08-29 VITALS — BP 120/60 | HR 105 | Wt 208.0 lb

## 2016-08-29 DIAGNOSIS — O09893 Supervision of other high risk pregnancies, third trimester: Secondary | ICD-10-CM | POA: Diagnosis not present

## 2016-08-29 DIAGNOSIS — Z1389 Encounter for screening for other disorder: Secondary | ICD-10-CM

## 2016-08-29 DIAGNOSIS — O24410 Gestational diabetes mellitus in pregnancy, diet controlled: Secondary | ICD-10-CM

## 2016-08-29 DIAGNOSIS — Z3A39 39 weeks gestation of pregnancy: Secondary | ICD-10-CM | POA: Diagnosis not present

## 2016-08-29 DIAGNOSIS — O471 False labor at or after 37 completed weeks of gestation: Secondary | ICD-10-CM | POA: Diagnosis present

## 2016-08-29 DIAGNOSIS — O479 False labor, unspecified: Secondary | ICD-10-CM

## 2016-08-29 DIAGNOSIS — Z331 Pregnant state, incidental: Secondary | ICD-10-CM

## 2016-08-29 DIAGNOSIS — O0993 Supervision of high risk pregnancy, unspecified, third trimester: Secondary | ICD-10-CM

## 2016-08-29 DIAGNOSIS — O0990 Supervision of high risk pregnancy, unspecified, unspecified trimester: Secondary | ICD-10-CM

## 2016-08-29 LAB — POCT URINALYSIS DIPSTICK
Glucose, UA: NEGATIVE
KETONES UA: NEGATIVE
NITRITE UA: NEGATIVE
Protein, UA: NEGATIVE

## 2016-08-29 NOTE — Progress Notes (Signed)
High Risk Pregnancy Diagnosis(es): A1DM G1P0 5276w2d Estimated Date of Delivery: 09/03/16 BP 120/60   Pulse (!) 105   Wt 208 lb (94.3 kg)   LMP 11/28/2015 (Approximate)   BMI 36.85 kg/m   Urinalysis: Positive for 1+ blood, small leuks HPI:  FBS only 1>90 (95), 2hr pp 93-120. [facility], states she was told cx was closed BP, weight, and urine reviewed.  Reports good fm. Denies regular uc's, lof, vb, uti s/s. No complaints.  Fundal Height:  37 Fetal Heart rate:  120 Edema:  none  Reviewed labor s/s, fkc All questions were answered Assessment: 6876w2d A1DM Medication(s) Plans:  none Treatment Plan:  IOL @ 40wks, scheduled for 8/7 @ [facility], IOL form sent and orders placed Follow up in 4-6wks for pp visit

## 2016-08-29 NOTE — MAU Note (Signed)
PT SAYS  [facility]  AT 0030.  VE IN OFFICE 1 CM. DENIES HSV AND  MRSA.  GBS-  UNSURE

## 2016-08-29 NOTE — Telephone Encounter (Signed)
Patient called stating that she was told she was going to get a piece of paper letting her know what she can and can not eat the night before and some other instruction. I asked patient if it does not state that on her AVS, patient states it did not. Please contact pt

## 2016-08-29 NOTE — Treatment Plan (Signed)
   Induction Assessment Scheduling Form: [facility]ax to [facility]:  [phone]  Rhina BrackettKatelynn L Cichy                                                                                   DOB:  08/10/1995                                                            MRN:  [id number]                                                                     Phone #:   [phone]                         Provider:  [facility]  GP:  G1P0                                                            Estimated Date of Delivery: 09/03/16  Dating Criteria: LMP c/w 6wk u/s    Medical Indications for induction:  A1DM Admission Date/Time:  8/7 @ [facility] Gestational age on admission:  40.0   Filed Weights   08/29/16 1205  Weight: 208 lb (94.3 kg)   HIV:   neg GBS:  neg  Method of induction(proposed):  cytotec   Scheduling Provider Signature:  Marge DuncansBooker, Kimberly Randall, CNM                                            Today's Date:  08/29/2016

## 2016-08-29 NOTE — Patient Instructions (Addendum)
Your induction is scheduled for Monday night 8/6 @ 11:45pm. [facility]Emergency[facility] entrance and let them know you are there to be induced. They will send someone from Labor & Delivery to come get you.    Call the office [phone]) or go to [facility] if:  You begin to have strong, frequent contractions  Your water breaks.  Sometimes it is a big gush of fluid, sometimes it is just a trickle that keeps getting your panties wet or running down your legs  You have vaginal bleeding.  It is normal to have a small amount of spotting if your cervix was checked.   You don't feel your baby moving like normal.  If you don't, get you something to eat and drink and lay down and focus on feeling your baby move.  You should feel at least 10 movements in 2 hours.  If you don't, you should call the office or go [facility].     [facility] Contractions of the uterus can occur throughout pregnancy, but they are not always a sign that you are in labor. You may have practice contractions called Braxton Hicks contractions. These false labor contractions are sometimes confused with true labor. What are Deberah PeltonBraxton Hicks contractions? Braxton Hicks contractions are tightening movements that occur in the muscles of the uterus before labor. Unlike true labor contractions, these contractions do not result in opening (dilation) and thinning of the cervix. Toward the end of pregnancy (32-34 weeks), Braxton Hicks contractions can happen more often and may become stronger. These contractions are sometimes difficult to tell apart from true labor because they can be very uncomfortable. You should not feel embarrassed if you go to the hospital with false labor. Sometimes, the only way to tell if you are in true labor is for your health care provider to look for changes in the cervix. The health care provider will do a physical exam and may monitor your contractions. If  you are not in true labor, the exam should show that your cervix is not dilating and your water has not broken. If there are no prenatal problems or other health problems associated with your pregnancy, it is completely safe for you to be sent home with false labor. You may continue to have Braxton Hicks contractions until you go into true labor. How can I tell the difference between true labor and false labor?  Differences ? False labor ? Contractions last 30-70 seconds.: Contractions are usually shorter and not as strong as true labor contractions. ? Contractions become very regular.: Contractions are usually irregular. ? Discomfort is usually felt in the top of the uterus, and it spreads to the lower abdomen and low back.: Contractions are often felt in the front of the lower abdomen and in the groin. ? Contractions do not go away with walking.: Contractions may go away when you walk around or change positions while lying down. ? Contractions usually become more intense and increase in frequency.: Contractions get weaker and are shorter-lasting as time goes on. ? The cervix dilates and gets thinner.: The cervix usually does not dilate or become thin. Follow these instructions at home:  Take over-the-counter and prescription medicines only as told by your health care provider.  Keep up with your usual exercises and follow other instructions from your health care provider.  Eat and drink lightly if you think you are going into labor.  If Braxton Hicks contractions are making you uncomfortable: ?  Change your position from lying down or resting to walking, or change from walking to resting. ? Sit and rest in a tub of warm water. ? Drink enough fluid to keep your urine clear or pale yellow. Dehydration may cause these contractions. ? Do slow and deep breathing several times an hour.  Keep all follow-up prenatal visits as told by your health care provider. This is important. Contact a health  care provider if:  You have a fever.  You have continuous pain in your abdomen. Get help right away if:  Your contractions become stronger, more regular, and closer together.  You have fluid leaking or gushing from your vagina.  You pass blood-tinged mucus (bloody show).  You have bleeding from your vagina.  You have low back pain that you never had before.  You feel your baby's head pushing down and causing pelvic pressure.  Your baby is not moving inside you as much as it used to. Summary  Contractions that occur before labor are called Braxton Hicks contractions, false labor, or practice contractions.  Braxton Hicks contractions are usually shorter, weaker, farther apart, and less regular than true labor contractions. True labor contractions usually become progressively stronger and regular and they become more frequent.  Manage discomfort from [facility] contractions by changing position, resting in a warm bath, drinking plenty of water, or practicing deep breathing. This information is not intended to replace advice given to you by your health care provider. Make sure you discuss any questions you have with your health care provider. Document Released: 01/14/2005 Document Revised: 12/04/2015 Document Reviewed: 12/04/2015 Elsevier Interactive Patient Education  2017 [employer]

## 2016-08-29 NOTE — Telephone Encounter (Signed)
Preadmission screen  

## 2016-08-29 NOTE — Telephone Encounter (Signed)
Informed patient she may eat whatever she wants the night before her induction. Verbalized understanding. States Mary Hull called her and answered a lot of her questions.

## 2016-08-29 NOTE — Discharge Instructions (Signed)

## 2016-09-03 ENCOUNTER — Encounter (HOSPITAL_COMMUNITY): Payer: Self-pay

## 2016-09-03 ENCOUNTER — Inpatient Hospital Stay (HOSPITAL_COMMUNITY)
Admission: RE | Admit: 2016-09-03 | Discharge: 2016-09-05 | DRG: 775 | Disposition: A | Discharge status: Home / Self Care | Payer: Medicaid Other | Source: Ambulatory Visit | Attending: Family Medicine | Admitting: Family Medicine

## 2016-09-03 ENCOUNTER — Inpatient Hospital Stay (HOSPITAL_COMMUNITY): Payer: Medicaid Other | Admitting: Anesthesiology

## 2016-09-03 DIAGNOSIS — Z8632 Personal history of gestational diabetes: Secondary | ICD-10-CM | POA: Diagnosis present

## 2016-09-03 DIAGNOSIS — O24420 Gestational diabetes mellitus in childbirth, diet controlled: Principal | ICD-10-CM | POA: Diagnosis present

## 2016-09-03 DIAGNOSIS — Z87891 Personal history of nicotine dependence: Secondary | ICD-10-CM | POA: Diagnosis not present

## 2016-09-03 DIAGNOSIS — Z3A40 40 weeks gestation of pregnancy: Secondary | ICD-10-CM

## 2016-09-03 DIAGNOSIS — O9952 Diseases of the respiratory system complicating childbirth: Secondary | ICD-10-CM | POA: Diagnosis present

## 2016-09-03 DIAGNOSIS — J45909 Unspecified asthma, uncomplicated: Secondary | ICD-10-CM | POA: Diagnosis present

## 2016-09-03 DIAGNOSIS — O24429 Gestational diabetes mellitus in childbirth, unspecified control: Secondary | ICD-10-CM | POA: Diagnosis not present

## 2016-09-03 LAB — CBC
HCT: 33 % — ABNORMAL LOW (ref 36.0–46.0)
Hemoglobin: 11.1 g/dL — ABNORMAL LOW (ref 12.0–15.0)
MCH: 27.8 pg (ref 26.0–34.0)
MCHC: 33.6 g/dL (ref 30.0–36.0)
MCV: 82.7 fL (ref 78.0–100.0)
PLATELETS: 253 10*3/uL (ref 150–400)
RBC: 3.99 MIL/uL (ref 3.87–5.11)
RDW: 14.4 % (ref 11.5–15.5)
WBC: 9.2 10*3/uL (ref 4.0–10.5)

## 2016-09-03 LAB — RPR: RPR Ser Ql: NONREACTIVE

## 2016-09-03 LAB — GLUCOSE, CAPILLARY
GLUCOSE-CAPILLARY: 110 mg/dL — AB (ref 65–99)
GLUCOSE-CAPILLARY: 89 mg/dL (ref 65–99)
GLUCOSE-CAPILLARY: 101 mg/dL — AB (ref 65–99)
Glucose-Capillary: 100 mg/dL — ABNORMAL HIGH (ref 65–99)
Glucose-Capillary: 108 mg/dL — ABNORMAL HIGH (ref 65–99)
Glucose-Capillary: 99 mg/dL (ref 65–99)

## 2016-09-03 LAB — ABO/RH: ABO/RH(D): A POS

## 2016-09-03 LAB — TYPE AND SCREEN
ABO/RH(D): A POS
ANTIBODY SCREEN: NEGATIVE

## 2016-09-03 MED ORDER — LACTATED RINGERS IV SOLN
INTRAVENOUS | Status: DC
  Administered 2016-09-03 (×2): via INTRAVENOUS

## 2016-09-03 MED ORDER — EPHEDRINE 5 MG/ML INJ
10.0000 mg | INTRAVENOUS | Status: DC | PRN
  Filled 2016-09-03: qty 2

## 2016-09-03 MED ORDER — TERBUTALINE SULFATE 1 MG/ML IJ SOLN
0.2500 mg | Freq: Once | INTRAMUSCULAR | Status: DC | PRN
  Filled 2016-09-03: qty 1

## 2016-09-03 MED ORDER — OXYTOCIN 40 UNITS IN LACTATED RINGERS INFUSION - SIMPLE MED
2.5000 [IU]/h | INTRAVENOUS | Status: DC
  Administered 2016-09-04: 2.5 [IU]/h via INTRAVENOUS

## 2016-09-03 MED ORDER — ACETAMINOPHEN 325 MG PO TABS: 650.0000 mg | ORAL_TABLET | ORAL | Status: DC | PRN

## 2016-09-03 MED ORDER — OXYTOCIN BOLUS FROM INFUSION
500.0000 mL | Freq: Once | INTRAVENOUS | Status: AC
  Administered 2016-09-04: 500 mL via INTRAVENOUS

## 2016-09-03 MED ORDER — LIDOCAINE HCL (PF) 1 % IJ SOLN
30.0000 mL | INTRAMUSCULAR | Status: DC | PRN
  Administered 2016-09-04: 30 mL via SUBCUTANEOUS
  Filled 2016-09-03: qty 30

## 2016-09-03 MED ORDER — ZOLPIDEM TARTRATE 5 MG PO TABS
5.0000 mg | ORAL_TABLET | Freq: Every evening | ORAL | Status: DC | PRN
  Administered 2016-09-03: 5 mg via ORAL
  Filled 2016-09-03: qty 1

## 2016-09-03 MED ORDER — MISOPROSTOL 25 MCG QUARTER TABLET: 25.0000 ug | ORAL_TABLET | ORAL | Status: DC | PRN

## 2016-09-03 MED ORDER — LACTATED RINGERS IV SOLN: 500.0000 mL | INTRAVENOUS | Status: DC | PRN

## 2016-09-03 MED ORDER — FLEET ENEMA 7-19 GM/118ML RE ENEM: 1.0000 | ENEMA | RECTAL | Status: DC | PRN

## 2016-09-03 MED ORDER — MISOPROSTOL 200 MCG PO TABS
50.0000 ug | ORAL_TABLET | ORAL | Status: DC | PRN
  Administered 2016-09-03 (×3): 50 ug via BUCCAL
  Filled 2016-09-03 (×3): qty 1

## 2016-09-03 MED ORDER — OXYTOCIN 40 UNITS IN LACTATED RINGERS INFUSION - SIMPLE MED
1.0000 m[IU]/min | INTRAVENOUS | Status: DC
  Administered 2016-09-03: 2 m[IU]/min via INTRAVENOUS
  Filled 2016-09-03: qty 1000

## 2016-09-03 MED ORDER — LACTATED RINGERS IV SOLN
500.0000 mL | Freq: Once | INTRAVENOUS | Status: AC
  Administered 2016-09-03: 500 mL via INTRAVENOUS

## 2016-09-03 MED ORDER — ONDANSETRON HCL 4 MG/2ML IJ SOLN: 4.0000 mg | Freq: Four times a day (QID) | INTRAMUSCULAR | Status: DC | PRN

## 2016-09-03 MED ORDER — DIPHENHYDRAMINE HCL 50 MG/ML IJ SOLN: 12.5000 mg | INTRAMUSCULAR | Status: DC | PRN

## 2016-09-03 MED ORDER — PHENYLEPHRINE 40 MCG/ML (10ML) SYRINGE FOR IV PUSH (FOR BLOOD PRESSURE SUPPORT)
80.0000 ug | PREFILLED_SYRINGE | INTRAVENOUS | Status: DC | PRN
  Filled 2016-09-03: qty 10
  Filled 2016-09-03: qty 5

## 2016-09-03 MED ORDER — LIDOCAINE HCL (PF) 1 % IJ SOLN
INTRAMUSCULAR | Status: DC | PRN
  Administered 2016-09-03: 13 mL via EPIDURAL

## 2016-09-03 MED ORDER — SOD CITRATE-CITRIC ACID 500-334 MG/5ML PO SOLN: 30.0000 mL | ORAL | Status: DC | PRN

## 2016-09-03 MED ORDER — HYDROXYZINE HCL 50 MG PO TABS
50.0000 mg | ORAL_TABLET | Freq: Four times a day (QID) | ORAL | Status: DC | PRN
  Filled 2016-09-03: qty 1

## 2016-09-03 MED ORDER — FENTANYL CITRATE (PF) 100 MCG/2ML IJ SOLN
50.0000 ug | INTRAMUSCULAR | Status: DC | PRN
  Administered 2016-09-03 (×3): 100 ug via INTRAVENOUS
  Filled 2016-09-03 (×3): qty 2

## 2016-09-03 MED ORDER — FENTANYL 2.5 MCG/ML BUPIVACAINE 1/10 % EPIDURAL INFUSION (WH - ANES)
14.0000 mL/h | INTRAMUSCULAR | Status: DC | PRN
  Administered 2016-09-03: 14 mL/h via EPIDURAL
  Filled 2016-09-03: qty 100

## 2016-09-03 MED ORDER — PHENYLEPHRINE 40 MCG/ML (10ML) SYRINGE FOR IV PUSH (FOR BLOOD PRESSURE SUPPORT)
80.0000 ug | PREFILLED_SYRINGE | INTRAVENOUS | Status: DC | PRN
  Administered 2016-09-03: 80 ug via INTRAVENOUS
  Filled 2016-09-03: qty 5

## 2016-09-03 NOTE — Anesthesia Preprocedure Evaluation (Signed)
Anesthesia Evaluation  Patient identified by MRN, date of birth, ID band Patient awake    Reviewed: Allergy & Precautions, NPO status , Patient's Chart, lab work & pertinent test results  Airway Mallampati: II  TM Distance: >3 FB Neck ROM: Full    Dental no notable dental hx.    Pulmonary neg pulmonary ROS, asthma , former smoker,    Pulmonary exam normal breath sounds clear to auscultation       Cardiovascular negative cardio ROS Normal cardiovascular exam Rhythm:Regular Rate:Normal     Neuro/Psych negative neurological ROS  negative psych ROS   GI/Hepatic negative GI ROS, Neg liver ROS,   Endo/Other  negative endocrine ROSdiabetes, Gestational  Renal/GU negative Renal ROS  negative genitourinary   Musculoskeletal negative musculoskeletal ROS (+)   Abdominal   Peds negative pediatric ROS (+)  Hematology negative hematology ROS (+)   Anesthesia Other Findings   Reproductive/Obstetrics negative OB ROS                             Anesthesia Physical Anesthesia Plan  ASA: II  Anesthesia Plan: Epidural   Post-op Pain Management:    Induction:   PONV Risk Score and Plan:   Airway Management Planned:   Additional Equipment:   Intra-op Plan:   Post-operative Plan:   Informed Consent:   Plan Discussed with:   Anesthesia Plan Comments:         Anesthesia Quick Evaluation

## 2016-09-03 NOTE — Progress Notes (Signed)
Patient ID: Mary BrackettKatelynn L Hull, female   DOB: 02/11/1995, 21 y.o.   MRN: [id number] Labor Progress Note  S: Patient examined for progress of labor. Patient comfortable.   O: BP 120/67   Pulse 78   Temp 98 F (36.7 C) (Oral)   Resp 18   Ht 5\' 3"  (1.6 m)   Wt 93.9 kg (207 lb)   LMP 11/28/2015 (Approximate)   BMI 36.67 kg/m   FHT: 120 bpm, mod var, +accels, no decels TOCO: q2-214min, patient looks comfortable during contractions  CVE: Dilation: 5 Effacement (%): 60 Cervical Position: Posterior Station: -3 Presentation: Vertex Exam by:: AThersa Salt. Schwarz RN   A&P: 21 y.o. G1P0 7050w0d here for IOL for A1GDM (EFW 61%)  S/p foley bulb at 1600, started on 2 milli-units of pitocin Continue induction management Anticipate SVD  SwazilandJordan Annica Marinello, DO FM Resident PGY-1 09/03/2016 4:39 PM

## 2016-09-03 NOTE — Progress Notes (Signed)
Patient ID: [name], female   DOB: 08/29/1995, 21 y.o.   MRN: [id number] Labor Progress Note  S: Patient seen & examined for progress of labor. Patient comfortable. Is asking for pain medication after placement of foley bulb- given fentanyl 100 through the IV. Family is getting restless and wondering when the baby will get here, reassured them and explained the induction process.   O: BP 135/80   Pulse 75   Temp 97.9 F (36.6 C) (Axillary)   Resp 18   Ht 5\' 3"  (1.6 m)   Wt 93.9 kg (207 lb)   LMP 11/28/2015 (Approximate)   BMI 36.67 kg/m   FHT: 130bpm, mod var, +accels, no decels TOCO: q1-624min, patient looks comfortable during contractions  CVE: Dilation: 1.5 Effacement (%): 60 Station: -3 Presentation: Vertex Exam by:: Dr. Talbert ForestShirley   A&P: 21 y.o. G1P0 1117w0d here for IOL for A1GDM  Foley bulb placed at 1200, along with third dose of cytotec buccally S/p cytotec x2.  CBG's all <110 with last result at 100. Continue to monitor; patient on carb modified diet.  Continue induction management Anticipate SVD  SwazilandJordan Clair Bardwell, DO [facility] Resident PGY-1 09/03/2016 12:13 PM

## 2016-09-03 NOTE — Anesthesia Procedure Notes (Signed)
Epidural Patient location during procedure: OB Start time: 09/03/2016 7:28 PM End time: 09/03/2016 7:41 PM  Staffing Anesthesiologist: Anitra LauthMILLER, Jaylee Lantry RAY Performed: anesthesiologist   Preanesthetic Checklist Completed: patient identified, site marked, surgical consent, pre-op evaluation, timeout performed, IV checked, risks and benefits discussed and monitors and equipment checked  Epidural Patient position: sitting Prep: DuraPrep Patient monitoring: heart rate, cardiac monitor, continuous pulse ox and blood pressure Approach: midline Location: L2-L3 Injection technique: LOR saline  Needle:  Needle type: Tuohy  Needle gauge: 17 G Needle length: 9 cm Needle insertion depth: 5 cm Catheter type: closed end flexible Catheter size: 20 Guage Catheter at skin depth: 9 cm Test dose: negative  Assessment Events: blood not aspirated, injection not painful, no injection resistance, negative IV test and no paresthesia  Additional Notes Reason for block:procedure for pain

## 2016-09-03 NOTE — Progress Notes (Signed)
LABOR PROGRESS NOTE  Mary BrackettKatelynn L Hull is a 21 y.o. G1P0 at 307w0d  admitted for IOL for A1GDM.  Subjective: Doing well. Denies any concerns.   Objective: BP 120/73   Pulse 95   Temp 98.3 F (36.8 C) (Oral)   Resp 19   Ht 5\' 3"  (1.6 m)   Wt 207 lb (93.9 kg)   LMP 11/28/2015 (Approximate)   SpO2 100%   BMI 36.67 kg/m  or  Vitals:   09/03/16 1945 09/03/16 1950 09/03/16 1955 09/03/16 2000  BP: 122/74 128/70 117/77 120/73  Pulse: 85 85 89 95  Resp: 18 18 18 19   Temp:      TempSrc:      SpO2:  99% 100% 100%  Weight:      Height:        Last SVE: Dilation: 6.5 Effacement (%): 60, 70 Cervical Position: Middle Station: -3 Presentation: Vertex Exam by:: Dorathy DaftShay Payne RN  FHT: baseline rate 120, moderate varibility, +acel, occasional early decel Toco: ctx q~82min   Assessment / Plan: 21 y.o. G1P0 at 717w0d here for IOL for A1GDM.  Labor: S/p FB and cytotec. Now on Pitocin @6mu /min. Continue current management Fetal Wellbeing:  Cat I Pain Control:  Epidural in place Anticipated MOD:  SVD GDM: CBG at goal, last 101  Frederik PearJulie P Bonnell Placzek, MD 09/03/2016, 8:59 PM

## 2016-09-03 NOTE — Progress Notes (Addendum)
Mary BrackettKatelynn L Hull is a 21 y.o. G1P0 at 2036w0d by admitted for induction of labor   Subjective: Patient reports doing well, pain well managed by epidural, feels some pressure.   Objective: BP (!) 110/51   Pulse 70   Temp 98.3 F (36.8 Mary) (Oral)   Resp 17   Ht 5\' 3"  (1.6 m)   Wt 93.9 kg (207 lb)   LMP 11/28/2015 (Approximate)   SpO2 100%   BMI 36.67 kg/m  No intake/output data recorded. No intake/output data recorded.  FHT:  FHR: 120 bpm, variability: moderate,  accelerations:  Present,  decelerations:  Absent UC:   regular, every 1-3 minutes SVE:   9/100%/0   Labs: Lab Results  Component Value Date   WBC 9.2 09/03/2016   HGB 11.1 (L) 09/03/2016   HCT 33.0 (L) 09/03/2016   MCV 82.7 09/03/2016   PLT 253 09/03/2016    Assessment / Plan: Induction of labor due to gestational diabetes,  progressing well on pitocin IUPC placed, was able reduce cervix.   Labor: Progressing normally  Fetal Wellbeing:  Category I Pain Control:  Epidural I/D:  n/a Anticipated MOD:  NSVD  Mary Hull Mary Hull 09/03/2016, 11:39 PM

## 2016-09-03 NOTE — Anesthesia Pain Management Evaluation Note (Signed)
  CRNA Pain Management Visit Note  Patient: Mary Hull, 21 y.o., female  "[facility]. We have an anesthesia team available at all times to provide care throughout the hospital, including epidural management and anesthesia for C-section. I don't know your plan for the delivery whether it a natural birth, water birth, IV sedation, nitrous supplementation, doula or epidural, but we want to meet your pain goals."   1.Was your pain managed to your expectations on prior hospitalizations?   No prior hospitalizations  2.What is your expectation for pain management during this hospitalization?     Epidural and IV pain meds  3.How can we help you reach that goal? IV pain RX, possible epidural  Record the patient's initial score and the patient's pain goal.   Pain: 0  Pain Goal: 9 [facility] wants you to be able to say your pain was always managed very well.  [name] 09/03/2016

## 2016-09-03 NOTE — H&P (Signed)
OBSTETRIC ADMISSION HISTORY AND PHYSICAL  Mary Hull is a 21 y.o. female G1P0 with IUP at 51w0dby LMP cw 7 week [place] for IOL for GPence She reports +FMs, No LOF, no VB, no blurry vision, headaches or peripheral edema, and RUQ pain.  She plans on Breast/ bottle feeding. She request POPs Vs depo for birth control. [facility]  Dating: By 7 week [place] LMP --->  Estimated Date of Delivery: 09/03/16  Sono:    _0 , CWD, normal anatomy,61% EFW   Prenatal History/Complications:  Past Medical History: Past Medical History:  Diagnosis Date  . Asthma   . Gestational diabetes    diet controlled    Past Surgical History: Past Surgical History:  Procedure Laterality Date  . FRACTURE SURGERY     L elbow  . TONSILLECTOMY      Obstetrical History: OB History    Gravida Para Term Preterm AB Living   1             SAB TAB Ectopic Multiple Live Births                  Social History: Social History   Social History  . Marital status: Married    Spouse name: N/A  . Number of children: N/A  . Years of education: N/A   Social History Main Topics  . Smoking status: Former Smoker    Years: 1.00    Types: Cigarettes  . Smokeless tobacco: Never Used  . Alcohol use No  . Drug use: No  . Sexual activity: Yes    Birth control/ protection: None   Other Topics Concern  . None   Social History Narrative  . None    Family History: No family history on file.  Allergies: Allergies  Allergen Reactions  . Tussionex Pennkinetic Er [Hydrocod Polst-Cpm Polst Er] Hives, Swelling and Other (See Comments)    Reaction:  Unspecified swelling reaction     Prescriptions Prior to Admission  Medication Sig Dispense Refill Last Dose  . ACCU-CHEK AVIVA PLUS test strip USE FOR QID TESTING  0 Taking  . ACCU-CHEK SOFTCLIX LANCETS lancets USE FOR QID TESTING  0 Taking  . albuterol (PROVENTIL HFA;VENTOLIN HFA) 108 (90 Base) MCG/ACT inhaler  Inhale 1-2 puffs into the lungs every 6 (six) hours as needed for wheezing or shortness of breath. 1 Inhaler 0 More than a month at Unknown time  . albuterol (PROVENTIL) (2.5 MG/3ML) 0.083% nebulizer solution Take 3 mLs (2.5 mg total) by nebulization every 4 (four) hours as needed for wheezing or shortness of breath. 30 vial 0 More than a month at Unknown time  . Blood Glucose Monitoring Suppl (ACCU-CHEK AVIVA PLUS) w/Device KIT USE FOR QID TESTING  0 Taking  . Prenatal MV-Min-FA-Omega-3 (PRENATAL GUMMIES/DHA & FA) 0.4-32.5 MG CHEW Chew 2 each by mouth daily.   Taking     Review of Systems   All systems reviewed and negative except as stated in HPI  Blood pressure 135/86, pulse (!) 108, temperature 98.4 F (36.9 C), temperature source Oral, resp. rate 18, height _1  (1.6 m), weight 207 lb (93.9 kg), last menstrual period 11/28/2015. General appearance: alert, cooperative and no distress Lungs: Normal WOB Heart: regular rate and rhythm Abdomen: soft, non-tender; bowel sounds normal Extremities: no sign of DVT  Presentation: cephalic Fetal monitoringBaseline: 125 bpm, Variability: Good {> 6 bpm), Accelerations: Reactive and Decelerations: Absent Uterine activity Intermittent  Dilation: 1  Effacement (%): 70, 60 Station: Ballotable Exam by:: Gailen Shelter, RN    Prenatal labs: ABO, Rh: A/Positive/-- (12/20 1531) Antibody: Negative (05/23 0905) Rubella: 24.00 (12/20 1531) RPR: Non Reactive (05/23 0905)  HBsAg: Negative (12/20 1531)  HIV:   Non reactive GBS:   negative 2 hr GTT abnormal 93/101/118 Genetic screening  Low risk Anatomy US WNL  Prenatal Transfer Tool  Maternal Diabetes: Yes:  Diabetes Type:  Diet controlled Genetic Screening: Normal Maternal Ultrasounds/Referrals: Normal Fetal Ultrasounds or other Referrals:  None Maternal Substance Abuse:  No Significant Maternal Medications:  None Significant Maternal Lab Results: None  Results for orders placed or performed  during the hospital encounter of 09/03/16 (from the past 24 hour(s))  CBC   Collection Time: 09/03/16  2:15 AM  Result Value Ref Range   WBC 9.2 4.0 - 10.5 K/uL   RBC 3.99 3.87 - 5.11 MIL/uL   Hemoglobin 11.1 (L) 12.0 - 15.0 g/dL   HCT 33.0 (L) 36.0 - 46.0 %   MCV 82.7 78.0 - 100.0 fL   MCH 27.8 26.0 - 34.0 pg   MCHC 33.6 30.0 - 36.0 g/dL   RDW 14.4 11.5 - 15.5 %   Platelets 253 150 - 400 K/uL    Patient Active Problem List   Diagnosis Date Noted  . Gestational diabetes mellitus, class A1 06/20/2016  . Abnormal findings on diagnostic imaging of gall bladder 03/27/2016  . Supervision of high risk pregnancy, antepartum 01/17/2016    Assessment/Plan:  Mary Hull is a 21 y.o. G1P0 at 26w0dhere for IOL for GDMA1.   #Labor: Plan ripening with cytotec and CRB when able  #Pain: IV pain medication, epidural on request #FWB:Cat1 #ID:  GBS negative #MOF: breast/ bottle #MOC:POPs vs Depo #Circ:  Outpatient  CWindell Moment MD  09/03/2016, 3:12 AM   I confirm that I have verified the information documented in the resident's note and that I have also personally reperformed the physical exam and all medical decision making activities.   HMarcille Buffy6:05 AM .09/03/16

## 2016-09-04 ENCOUNTER — Encounter (HOSPITAL_COMMUNITY): Payer: Self-pay

## 2016-09-04 DIAGNOSIS — Z3A40 40 weeks gestation of pregnancy: Secondary | ICD-10-CM

## 2016-09-04 DIAGNOSIS — O24429 Gestational diabetes mellitus in childbirth, unspecified control: Secondary | ICD-10-CM

## 2016-09-04 LAB — CBC
HCT: 29.3 % — ABNORMAL LOW (ref 36.0–46.0)
Hemoglobin: 9.9 g/dL — ABNORMAL LOW (ref 12.0–15.0)
MCH: 28.5 pg (ref 26.0–34.0)
MCHC: 33.8 g/dL (ref 30.0–36.0)
MCV: 84.4 fL (ref 78.0–100.0)
Platelets: 200 10*3/uL (ref 150–400)
RBC: 3.47 MIL/uL — ABNORMAL LOW (ref 3.87–5.11)
RDW: 14.4 % (ref 11.5–15.5)
WBC: 14 10*3/uL — ABNORMAL HIGH (ref 4.0–10.5)

## 2016-09-04 MED ORDER — IBUPROFEN 600 MG PO TABS
600.0000 mg | ORAL_TABLET | Freq: Four times a day (QID) | ORAL | Status: DC
Start: 2016-09-04 — End: 2016-09-05
  Administered 2016-09-04 – 2016-09-05 (×7): 600 mg via ORAL
  Filled 2016-09-04 (×6): qty 1

## 2016-09-04 MED ORDER — ALBUTEROL SULFATE (2.5 MG/3ML) 0.083% IN NEBU: 3.0000 mL | INHALATION_SOLUTION | Freq: Four times a day (QID) | RESPIRATORY_TRACT | Status: DC | PRN

## 2016-09-04 MED ORDER — BENZOCAINE-MENTHOL 20-0.5 % EX AERO
1.0000 "application " | INHALATION_SPRAY | CUTANEOUS | Status: DC | PRN
  Filled 2016-09-04: qty 56

## 2016-09-04 MED ORDER — ZOLPIDEM TARTRATE 5 MG PO TABS: 5.0000 mg | ORAL_TABLET | Freq: Every evening | ORAL | Status: DC | PRN

## 2016-09-04 MED ORDER — TETANUS-DIPHTH-ACELL PERTUSSIS 5-2.5-18.5 LF-MCG/0.5 IM SUSP
0.5000 mL | Freq: Once | INTRAMUSCULAR | Status: DC
  Filled 2016-09-04: qty 0.5

## 2016-09-04 MED ORDER — DIBUCAINE 1 % RE OINT: 1.0000 "application " | TOPICAL_OINTMENT | RECTAL | Status: DC | PRN

## 2016-09-04 MED ORDER — IBUPROFEN 600 MG PO TABS
ORAL_TABLET | ORAL | Status: AC
  Administered 2016-09-04: 600 mg via ORAL
  Filled 2016-09-04: qty 1

## 2016-09-04 MED ORDER — ONDANSETRON HCL 4 MG/2ML IJ SOLN
4.0000 mg | INTRAMUSCULAR | Status: DC | PRN
Start: 2016-09-04 — End: 2016-09-05

## 2016-09-04 MED ORDER — DIPHENHYDRAMINE HCL 25 MG PO CAPS: 25.0000 mg | ORAL_CAPSULE | Freq: Four times a day (QID) | ORAL | Status: DC | PRN

## 2016-09-04 MED ORDER — PRENATAL MULTIVITAMIN CH
1.0000 | ORAL_TABLET | Freq: Every day | ORAL | Status: DC
  Administered 2016-09-04 – 2016-09-05 (×2): 1 via ORAL
  Filled 2016-09-04 (×2): qty 1

## 2016-09-04 MED ORDER — COCONUT OIL OIL: 1.0000 "application " | TOPICAL_OIL | Status: DC | PRN

## 2016-09-04 MED ORDER — SENNOSIDES-DOCUSATE SODIUM 8.6-50 MG PO TABS
2.0000 | ORAL_TABLET | ORAL | Status: DC
  Administered 2016-09-04: 2 via ORAL
  Filled 2016-09-04: qty 2

## 2016-09-04 MED ORDER — WITCH HAZEL-GLYCERIN EX PADS: 1.0000 "application " | MEDICATED_PAD | CUTANEOUS | Status: DC | PRN

## 2016-09-04 MED ORDER — SIMETHICONE 80 MG PO CHEW: 80.0000 mg | CHEWABLE_TABLET | ORAL | Status: DC | PRN

## 2016-09-04 MED ORDER — ONDANSETRON HCL 4 MG PO TABS: 4.0000 mg | ORAL_TABLET | ORAL | Status: DC | PRN

## 2016-09-04 MED ORDER — ACETAMINOPHEN 325 MG PO TABS
650.0000 mg | ORAL_TABLET | ORAL | Status: DC | PRN
  Administered 2016-09-04 – 2016-09-05 (×2): 650 mg via ORAL
  Filled 2016-09-04 (×2): qty 2

## 2016-09-04 NOTE — Plan of Care (Signed)
Problem: Bowel/Gastric: Goal: Gastrointestinal status will improve Outcome: Progressing Mom stated that she has not had a bowel movement. Bowel sounds audible. Reviewed use of stool softeners.

## 2016-09-04 NOTE — Anesthesia Postprocedure Evaluation (Signed)
Anesthesia Post Note  Patient: Mary BrackettKatelynn L Hull  Procedure(s) Performed: * No procedures listed *     Patient location during evaluation: Mother Baby Anesthesia Type: Epidural Level of consciousness: awake and alert and oriented Pain management: pain level controlled Vital Signs Assessment: post-procedure vital signs reviewed and stable Respiratory status: spontaneous breathing and nonlabored ventilation Cardiovascular status: stable Postop Assessment: no headache, patient able to bend at knees, no signs of nausea or vomiting, no backache, epidural receding and adequate PO intake Anesthetic complications: no    Last Vitals:  Vitals:   09/04/16 0328 09/04/16 0430  BP: 112/69 126/70  Pulse: 98 (!) 109  Resp: 18 18  Temp: 36.8 C 36.7 C    Last Pain:  Vitals:   09/04/16 0808  TempSrc:   PainSc: 3    Pain Goal: Patients Stated Pain Goal: 2 (09/04/16 0808)               [name],[name]

## 2016-09-05 ENCOUNTER — Encounter (HOSPITAL_COMMUNITY): Payer: Self-pay

## 2016-09-05 DIAGNOSIS — O24429 Gestational diabetes mellitus in childbirth, unspecified control: Secondary | ICD-10-CM

## 2016-09-05 DIAGNOSIS — Z3A40 40 weeks gestation of pregnancy: Secondary | ICD-10-CM

## 2016-09-05 LAB — BIRTH TISSUE RECOVERY COLLECTION (PLACENTA DONATION)

## 2016-09-05 MED ORDER — IBUPROFEN 600 MG PO TABS: 600.0000 mg | ORAL_TABLET | Freq: Four times a day (QID) | ORAL | 0 refills | Status: DC

## 2016-09-05 NOTE — Discharge Summary (Signed)
OB Discharge Summary     Patient Name: Mary Hull DOB: 1995/06/12 MRN: [id number]  Date of admission: 09/03/2016 Delivering MD: Frederik Pear   Date of discharge: 09/05/2016  Admitting diagnosis: INDUCTION Intrauterine pregnancy: [redacted]w[redacted]d     Secondary diagnosis:  Active Problems:   Gestational diabetes mellitus, class A1      Discharge diagnosis: Term Pregnancy Delivered                                                                                                Post partum procedures:None  Augmentation: Pitocin, Cytotec and Foley Balloon  Complications: None  Hospital course:  Induction of Labor With Vaginal Delivery   21 y.o. yo G1P1001 at [redacted]w[redacted]d was admitted to the hospital 09/03/2016 for induction of labor.  Indication for induction: A1 DM.  Patient had an uncomplicated labor course as follows: Membrane Rupture Time/Date: 6:53 PM ,09/03/2016   Intrapartum Procedures: Episiotomy: None [1]                                         Lacerations:  1st degree [2];Periurethral [8]  Patient had delivery of a Viable infant.  Information for the patient's newborn:  Otila, Starn [[id number]]  Delivery Method: Vaginal, Spontaneous Delivery (Filed from Delivery Summary)   09/04/2016  Details of delivery can be found in separate delivery note.  Patient had a routine postpartum course. Patient is discharged home 09/05/16.  Physical exam  Vitals:   09/04/16 0810 09/04/16 1630 09/04/16 2004 09/05/16 0515  BP: 127/67 130/71 123/72 124/73  Pulse: (!) 113 96 88 89  Resp: 20 18 18 18   Temp: 98.4 F (36.9 C) 98 F (36.7 C) 98.1 F (36.7 C) 97.6 F (36.4 C)  TempSrc: Oral Oral Oral Oral  SpO2:      Weight:      Height:       General: alert, cooperative and no distress Lochia: appropriate Uterine Fundus: firm Incision: N/A DVT Evaluation: No evidence of DVT seen on physical exam. Labs: Lab Results  Component Value Date   WBC 14.0 (H) 09/04/2016   HGB 9.9 (L)  09/04/2016   HCT 29.3 (L) 09/04/2016   MCV 84.4 09/04/2016   PLT 200 09/04/2016   CMP Latest Ref Rng & Units 08/14/2016  Glucose 65 - 99 mg/dL 87  BUN 6 - 20 mg/dL -  Creatinine 0.10 - 2.72 mg/dL -  Sodium 536 - 644 mmol/L -  Potassium 3.5 - 5.1 mmol/L -  Chloride 101 - 111 mmol/L -  CO2 22 - 32 mmol/L -  Calcium 8.9 - 10.3 mg/dL -  Total Protein 6.5 - 8.1 g/dL -  Total Bilirubin 0.3 - 1.2 mg/dL -  Alkaline Phos 38 - 034 U/L -  AST 15 - 41 U/L -  ALT 14 - 54 U/L -    Discharge instruction: per After Visit Summary and "Baby and Me Booklet".  After visit meds:  Allergies as of 09/05/2016  Reactions   Tussionex Pennkinetic Er [hydrocod Polst-cpm Polst Er] Hives, Swelling, Other (See Comments)   Reaction:  Unspecified swelling reaction       Medication List    TAKE these medications   albuterol (2.5 MG/3ML) 0.083% nebulizer solution Commonly known as:  PROVENTIL Take 3 mLs (2.5 mg total) by nebulization every 4 (four) hours as needed for wheezing or shortness of breath.   albuterol 108 (90 Base) MCG/ACT inhaler Commonly known as:  PROVENTIL HFA;VENTOLIN HFA Inhale 1-2 puffs into the lungs every 6 (six) hours as needed for wheezing or shortness of breath.   ibuprofen 600 MG tablet Commonly known as:  ADVIL,MOTRIN Take 1 tablet (600 mg total) by mouth every 6 (six) hours.   PRENATAL GUMMIES/DHA & FA 0.4-32.5 MG Chew Chew 2 each by mouth daily.       Diet: routine diet  Activity: Advance as tolerated. Pelvic rest for 6 weeks.   Outpatient follow up:6 weeks Follow up Appt:Future Appointments Date Time Provider Department Center  10/10/2016 3:30 PM Cheral MarkerBooker, Kimberly R, CNM [facility]   Follow up Visit:No Follow-up on file.  Postpartum contraception: Progesterone only pills  Newborn Data: Live born female  Birth Weight: 7 lb 1.9 oz (3229 g) APGAR: 9, 9  Baby Feeding: Breast and bottle Disposition:home with mother   09/05/2016 John Giovanniorey P Cox, MD   OB  FELLOW DISCHARGE ATTESTATION  I have seen and examined this patient and agree with above documentation in the resident's note.   Frederik PearJulie P Vaudine Dutan, MD OB Fellow 5:55 PM

## 2016-09-05 NOTE — Discharge Instructions (Signed)

## 2016-09-05 NOTE — Progress Notes (Addendum)
Post Partum Day 1 Subjective: no complaints, up ad lib, voiding, tolerating PO and + flatus Indifferent about going home  Objective: Blood pressure 124/73, pulse 89, temperature 97.6 F (36.4 C), temperature source Oral, resp. rate 18, height 5\' 3"  (1.6 m), weight 93.9 kg (207 lb), last menstrual period 11/28/2015, SpO2 98 %, unknown if currently breastfeeding.  Physical Exam:  General: alert, cooperative and no distress Lochia: appropriate Uterine Fundus: firm  DVT Evaluation: No evidence of DVT seen on physical exam.   Recent Labs  09/03/16 0215 09/04/16 0607  HGB 11.1* 9.9*  HCT 33.0* 29.3*    Assessment/Plan: Plan for discharge tomorrow patient may wish to be discharge sooner but as of now patient was indifferent and will plan on staying.    LOS: 2 days   Ignacia MarvelKendrick C White 09/05/2016, 8:55 AM   CNM attestation Post Partum Day #1 I have seen and examined this patient and agree with above documentation in the resident's note.   Rhina BrackettKatelynn L Whitworth is a 21 y.o. G1P1001 s/p SVD.  Pt denies problems with ambulating, voiding or po intake. Pain is well controlled.  Plan for birth control is oral progesterone-only contraceptive, Depo-Provera- undecided.  Method of Feeding: both  PE:  BP 124/73 (BP Location: Left Arm)   Pulse 89   Temp 97.6 F (36.4 C) (Oral)   Resp 18   Ht 5\' 3"  (1.6 m)   Wt 93.9 kg (207 lb)   LMP 11/28/2015 (Approximate)   SpO2 98%   Breastfeeding? Unknown   BMI 36.67 kg/m  Fundus firm  Plan for discharge: probably 09/06/16; if baby is discharged today then pt is open to going home  Cam HaiSHAW, Mela Perham, CNM 9:07 AM 09/05/2016

## 2016-09-09 ENCOUNTER — Telehealth: Payer: Self-pay | Admitting: *Deleted

## 2016-09-09 ENCOUNTER — Telehealth: Payer: Self-pay | Admitting: Advanced Practice Midwife

## 2016-09-09 NOTE — Telephone Encounter (Signed)
[facility] to [facility] a [facility] Hgb of 9.0. She states patient is not taking her PNV. Hgb was 9.9 when she left the hospital. Advised to resume PNV and add OTC iron supplement as well as iron rich foods. She informed pt.

## 2016-09-09 NOTE — Telephone Encounter (Signed)
LMOVM that pt didn't have to take an OTC iron supplement as long as her PNV had iron in it. Advised to increase intake in green leafy vegetables, red meat, beans, etc for added iron.

## 2016-09-10 ENCOUNTER — Telehealth: Payer: Self-pay | Admitting: Advanced Practice Midwife

## 2016-09-10 NOTE — Telephone Encounter (Signed)
Pt called stating that things have been going fine with her since she had her baby but today she had what felt like a contraction. I informed her that she may experience some cramping postpartum, and that is her uterus shrinking back to its pre-pregnancy state. She denies any large blood clots. Advised pt to call us back if s/s worsen or dont improve over the next couple of days. Pt verbalized understanding.

## 2016-09-15 ENCOUNTER — Emergency Department (HOSPITAL_COMMUNITY): Payer: Medicaid Other

## 2016-09-15 ENCOUNTER — Encounter (HOSPITAL_COMMUNITY): Payer: Self-pay | Admitting: *Deleted

## 2016-09-15 ENCOUNTER — Inpatient Hospital Stay (HOSPITAL_COMMUNITY)
Admission: EM | Admit: 2016-09-15 | Discharge: 2016-09-17 | DRG: 776 | Disposition: A | Discharge status: Home / Self Care | Payer: Medicaid Other | Attending: Internal Medicine | Admitting: Internal Medicine

## 2016-09-15 DIAGNOSIS — J181 Lobar pneumonia, unspecified organism: Secondary | ICD-10-CM

## 2016-09-15 DIAGNOSIS — Z79899 Other long term (current) drug therapy: Secondary | ICD-10-CM

## 2016-09-15 DIAGNOSIS — A419 Sepsis, unspecified organism: Secondary | ICD-10-CM

## 2016-09-15 DIAGNOSIS — J45901 Unspecified asthma with (acute) exacerbation: Secondary | ICD-10-CM | POA: Diagnosis present

## 2016-09-15 DIAGNOSIS — O9953 Diseases of the respiratory system complicating the puerperium: Secondary | ICD-10-CM | POA: Diagnosis present

## 2016-09-15 DIAGNOSIS — Z87891 Personal history of nicotine dependence: Secondary | ICD-10-CM | POA: Diagnosis not present

## 2016-09-15 DIAGNOSIS — O8620 Urinary tract infection following delivery, unspecified: Secondary | ICD-10-CM | POA: Diagnosis present

## 2016-09-15 DIAGNOSIS — J189 Pneumonia, unspecified organism: Secondary | ICD-10-CM | POA: Diagnosis present

## 2016-09-15 DIAGNOSIS — Z8632 Personal history of gestational diabetes: Secondary | ICD-10-CM | POA: Diagnosis not present

## 2016-09-15 DIAGNOSIS — R0603 Acute respiratory distress: Secondary | ICD-10-CM

## 2016-09-15 DIAGNOSIS — Z885 Allergy status to narcotic agent status: Secondary | ICD-10-CM | POA: Diagnosis not present

## 2016-09-15 DIAGNOSIS — N390 Urinary tract infection, site not specified: Secondary | ICD-10-CM | POA: Diagnosis not present

## 2016-09-15 LAB — CBC WITH DIFFERENTIAL/PLATELET
Basophils Absolute: 0 10*3/uL (ref 0.0–0.1)
Basophils Relative: 0 %
EOS ABS: 0.1 10*3/uL (ref 0.0–0.7)
EOS PCT: 1 %
HCT: 33.3 % — ABNORMAL LOW (ref 36.0–46.0)
Hemoglobin: 10.8 g/dL — ABNORMAL LOW (ref 12.0–15.0)
LYMPHS ABS: 1.2 10*3/uL (ref 0.7–4.0)
Lymphocytes Relative: 9 %
MCH: 27.4 pg (ref 26.0–34.0)
MCHC: 32.4 g/dL (ref 30.0–36.0)
MCV: 84.5 fL (ref 78.0–100.0)
MONO ABS: 0.6 10*3/uL (ref 0.1–1.0)
Monocytes Relative: 5 %
Neutro Abs: 10.6 10*3/uL — ABNORMAL HIGH (ref 1.7–7.7)
Neutrophils Relative %: 85 %
PLATELETS: ADEQUATE 10*3/uL (ref 150–400)
RBC: 3.94 MIL/uL (ref 3.87–5.11)
RDW: 13.8 % (ref 11.5–15.5)
WBC: 12.5 10*3/uL — AB (ref 4.0–10.5)

## 2016-09-15 LAB — TSH: TSH: 1.143 u[IU]/mL (ref 0.350–4.500)

## 2016-09-15 LAB — URINALYSIS, ROUTINE W REFLEX MICROSCOPIC
Bacteria, UA: NONE SEEN
Bilirubin Urine: NEGATIVE
GLUCOSE, UA: NEGATIVE mg/dL
Hgb urine dipstick: NEGATIVE
Ketones, ur: NEGATIVE mg/dL
Nitrite: NEGATIVE
PH: 8 (ref 5.0–8.0)
Protein, ur: NEGATIVE mg/dL
SPECIFIC GRAVITY, URINE: 1.015 (ref 1.005–1.030)

## 2016-09-15 LAB — I-STAT CG4 LACTIC ACID, ED
LACTIC ACID, VENOUS: 2.44 mmol/L — AB (ref 0.5–1.9)
Lactic Acid, Venous: 1.73 mmol/L (ref 0.5–1.9)

## 2016-09-15 LAB — COMPREHENSIVE METABOLIC PANEL
ALT: 15 U/L (ref 14–54)
AST: 21 U/L (ref 15–41)
Albumin: 3.4 g/dL — ABNORMAL LOW (ref 3.5–5.0)
Alkaline Phosphatase: 97 U/L (ref 38–126)
Anion gap: 12 (ref 5–15)
BILIRUBIN TOTAL: 0.5 mg/dL (ref 0.3–1.2)
BUN: 9 mg/dL (ref 6–20)
CHLORIDE: 106 mmol/L (ref 101–111)
CO2: 21 mmol/L — ABNORMAL LOW (ref 22–32)
CREATININE: 0.79 mg/dL (ref 0.44–1.00)
Calcium: 9.2 mg/dL (ref 8.9–10.3)
GFR calc Af Amer: >60 mL/min (ref >60)
Glucose, Bld: 129 mg/dL — ABNORMAL HIGH (ref 65–99)
Potassium: 3.7 mmol/L (ref 3.5–5.1)
Sodium: 139 mmol/L (ref 135–145)
TOTAL PROTEIN: 7.5 g/dL (ref 6.5–8.1)

## 2016-09-15 MED ORDER — SODIUM CHLORIDE 0.9% FLUSH
3.0000 mL | Freq: Two times a day (BID) | INTRAVENOUS | Status: DC
  Administered 2016-09-15 – 2016-09-16 (×2): 3 mL via INTRAVENOUS

## 2016-09-15 MED ORDER — METHYLPREDNISOLONE SODIUM SUCC 125 MG IJ SOLR
125.0000 mg | Freq: Once | INTRAMUSCULAR | Status: AC
  Administered 2016-09-15: 125 mg via INTRAVENOUS
  Filled 2016-09-15: qty 2

## 2016-09-15 MED ORDER — ACETAMINOPHEN 650 MG RE SUPP: 650.0000 mg | Freq: Four times a day (QID) | RECTAL | Status: DC | PRN

## 2016-09-15 MED ORDER — DEXTROSE 5 % IV SOLN
1.0000 g | INTRAVENOUS | Status: DC
  Administered 2016-09-16: 1 g via INTRAVENOUS
  Filled 2016-09-15 (×2): qty 10

## 2016-09-15 MED ORDER — LEVALBUTEROL HCL 0.63 MG/3ML IN NEBU
0.6300 mg | INHALATION_SOLUTION | Freq: Four times a day (QID) | RESPIRATORY_TRACT | Status: DC
  Administered 2016-09-15 – 2016-09-16 (×2): 0.63 mg via RESPIRATORY_TRACT
  Filled 2016-09-15 (×2): qty 3

## 2016-09-15 MED ORDER — ALBUTEROL (5 MG/ML) CONTINUOUS INHALATION SOLN
10.0000 mg/h | INHALATION_SOLUTION | RESPIRATORY_TRACT | Status: DC
  Administered 2016-09-15: 10 mg/h via RESPIRATORY_TRACT
  Filled 2016-09-15: qty 20

## 2016-09-15 MED ORDER — ENOXAPARIN SODIUM 60 MG/0.6ML ~~LOC~~ SOLN: 45.0000 mg | SUBCUTANEOUS | Status: DC

## 2016-09-15 MED ORDER — AZITHROMYCIN 250 MG PO TABS
500.0000 mg | ORAL_TABLET | Freq: Once | ORAL | Status: AC
  Administered 2016-09-15: 500 mg via ORAL
  Filled 2016-09-15: qty 2

## 2016-09-15 MED ORDER — DEXTROSE 5 % IV SOLN
500.0000 mg | INTRAVENOUS | Status: DC
  Administered 2016-09-16: 500 mg via INTRAVENOUS
  Filled 2016-09-15 (×2): qty 500

## 2016-09-15 MED ORDER — SODIUM CHLORIDE 0.9 % IV SOLN
1000.0000 mL | INTRAVENOUS | Status: DC
  Administered 2016-09-15 (×2): 1000 mL via INTRAVENOUS

## 2016-09-15 MED ORDER — ACETAMINOPHEN 325 MG PO TABS: 650.0000 mg | ORAL_TABLET | Freq: Four times a day (QID) | ORAL | Status: DC | PRN

## 2016-09-15 MED ORDER — METHYLPREDNISOLONE SODIUM SUCC 125 MG IJ SOLR
60.0000 mg | Freq: Four times a day (QID) | INTRAMUSCULAR | Status: DC
  Administered 2016-09-15 – 2016-09-17 (×6): 60 mg via INTRAVENOUS
  Filled 2016-09-15 (×6): qty 2

## 2016-09-15 MED ORDER — SODIUM CHLORIDE 0.9 % IV SOLN
INTRAVENOUS | Status: DC
  Administered 2016-09-15: 23:00:00 via INTRAVENOUS

## 2016-09-15 MED ORDER — DEXTROSE 5 % IV SOLN
1.0000 g | Freq: Once | INTRAVENOUS | Status: AC
  Administered 2016-09-15: 1 g via INTRAVENOUS
  Filled 2016-09-15: qty 10

## 2016-09-15 NOTE — Progress Notes (Signed)
Patient started on 2lpm/[place] for saturation 89-91 neb finished

## 2016-09-15 NOTE — ED Triage Notes (Signed)
Pt comes in with cough, congestion, and fever starting 4 days ago. Pt has wheezing throughout.

## 2016-09-15 NOTE — H&P (Signed)
History and Physical    [name]:[id number] DOB: 03-30-1995 DOA: 09/15/2016  PCP: Patient, No Pcp Per  Patient coming from: Home.    Chief Complaint:   Coughs, wheezing, and SOB.   HPI: Mary Hull is an 21 y.o. female with hx of ashtma, on PRN inhaler, about 1 week post partum with vaginal delivery, presents to the ER with fever, SOB and wheezing for just one day.  Work up in the ER included a CXR which showed a right lobe infiltrates.  She was found to have tachycardia with HR of 140, and leukocytosis with WBC of 12K.  She was given neb Tx, IV Rocephin and IV Zithromax, and IV steroids, and hospitalist was asked to admit her for CAP.  She does not smoke.     ED Course:  See above.  Rewiew of Systems:  Constitutional: Negative for malaise, fever and chills. No significant weight loss or weight gain Eyes: Negative for eye pain, redness and discharge, diplopia, visual changes, or flashes of light. ENMT: Negative for ear pain, hoarseness, nasal congestion, sinus pressure and sore throat. No headaches; tinnitus, drooling, or problem swallowing. Cardiovascular: Negative for chest pain, palpitations, diaphoresis, dyspnea and peripheral edema. ; No orthopnea, PND Respiratory: Negative for cough, hemoptysis, wheezing and stridor. No pleuritic chestpain. Gastrointestinal: Negative for diarrhea, constipation,  melena, blood in stool, hematemesis, jaundice and rectal bleeding.    Genitourinary: Negative for frequency, dysuria, incontinence,flank pain and hematuria; Musculoskeletal: Negative for back pain and neck pain. Negative for swelling and trauma.;  Skin: . Negative for pruritus, rash, abrasions, bruising and skin lesion.; ulcerations Neuro: Negative for headache, lightheadedness and neck stiffness. Negative for weakness, altered level of consciousness , altered mental status, extremity weakness, burning feet, involuntary movement, seizure and syncope.  Psych: negative for  anxiety, depression, insomnia, tearfulness, panic attacks, hallucinations, paranoia, suicidal or homicidal ideation    Past Medical History:  Diagnosis Date  . Asthma   . Gestational diabetes    diet controlled    Past Surgical History:  Procedure Laterality Date  . FRACTURE SURGERY     L elbow  . TONSILLECTOMY       reports that she has quit smoking. Her smoking use included Cigarettes. She quit after 1.00 year of use. She has never used smokeless tobacco. She reports that she does not drink alcohol or use drugs.  Allergies  Allergen Reactions  . Tussionex Pennkinetic Er [Hydrocod Polst-Cpm Polst Er] Hives, Swelling and Other (See Comments)    Reaction:  Unspecified swelling reaction     No family history on file.   Prior to Admission medications   Medication Sig Start Date End Date Taking? Authorizing Provider  albuterol (PROVENTIL HFA;VENTOLIN HFA) 108 (90 Base) MCG/ACT inhaler Inhale 1-2 puffs into the lungs every 6 (six) hours as needed for wheezing or shortness of breath. 11/06/15  Yes Vanetta Mulders, MD  albuterol (PROVENTIL) (2.5 MG/3ML) 0.083% nebulizer solution Take 3 mLs (2.5 mg total) by nebulization every 4 (four) hours as needed for wheezing or shortness of breath. 04/09/15  Yes Mancel Bale, MD  dextromethorphan-guaiFENesin [facility] DM) 30-600 MG 12hr tablet Take 1 tablet by mouth 2 (two) times daily as needed for cough.   Yes [provider]  ferrous sulfate 325 (65 FE) MG EC tablet Take 325 mg by mouth daily.   Yes [provider]  ibuprofen (ADVIL,MOTRIN) 600 MG tablet Take 1 tablet (600 mg total) by mouth every 6 (six) hours. 09/05/16  Yes Cox,  Mariann Laster, MD  pseudoephedrine (SUDAFED) 30 MG tablet Take 30 mg by mouth every 4 (four) hours as needed for congestion.   Yes [provider]    Physical Exam: Vitals:   09/15/16 1930 09/15/16 1945 09/15/16 2000 09/15/16 2030  BP: 123/78  130/82 135/81  Pulse: (!) 149 (!) 163 (!) 161 (!)  144  Resp: (!) 27 19 (!) 27 (!) 25  Temp:      TempSrc:      SpO2: 99% 100%        Constitutional: NAD, calm, comfortable Vitals:   09/15/16 1930 09/15/16 1945 09/15/16 2000 09/15/16 2030  BP: 123/78  130/82 135/81  Pulse: (!) 149 (!) 163 (!) 161 (!) 144  Resp: (!) 27 19 (!) 27 (!) 25  Temp:      TempSrc:      SpO2: 99% 100%     Eyes: PERRL, lids and conjunctivae normal ENMT: Mucous membranes are moist. Posterior pharynx clear of any exudate or lesions.Normal dentition.  Neck: normal, supple, no masses, no thyromegaly Respiratory: bilateral wheezing with scattered rhonchi. no crackles. Normal respiratory effort. No accessory muscle use.  Cardiovascular: Regular rate and rhythm, no murmurs / rubs / gallops. No extremity edema. 2+ pedal pulses. No carotid bruits.  Abdomen: no tenderness, no masses palpated. No hepatosplenomegaly. Bowel sounds positive.  Musculoskeletal: no clubbing / cyanosis. No joint deformity upper and lower extremities. Good ROM, no contractures. Normal muscle tone.  Skin: no rashes, lesions, ulcers. No induration Neurologic: CN 2-12 grossly intact. Sensation intact, DTR normal. Strength 5/5 in all 4.  Psychiatric: Normal judgment and insight. Alert and oriented x 3. Normal mood.    Labs on Admission: I have personally reviewed following labs and imaging studies  CBC:  Recent Labs Lab 09/15/16 1829  WBC 12.5*  NEUTROABS 10.6*  HGB 10.8*  HCT 33.3*  MCV 84.5  PLT PLATELET CLUMPS NOTED ON SMEAR, COUNT APPEARS ADEQUATE   Basic Metabolic Panel:  Recent Labs Lab 09/15/16 1829  NA 139  K 3.7  CL 106  CO2 21*  GLUCOSE 129*  BUN 9  CREATININE 0.79  CALCIUM 9.2   GFR: Estimated Creatinine Clearance: 123.2 mL/min (by C-G formula based on SCr of 0.79 mg/dL). Liver Function Tests:  Recent Labs Lab 09/15/16 1829  AST 21  ALT 15  ALKPHOS 97  BILITOT 0.5  PROT 7.5  ALBUMIN 3.4*   Urine analysis:    Component Value Date/Time   COLORURINE  YELLOW 09/15/2016 1844   APPEARANCEUR CLEAR 09/15/2016 1844   APPEARANCEUR Clear 01/17/2016 1531   LABSPEC 1.015 09/15/2016 1844   PHURINE 8.0 09/15/2016 1844   GLUCOSEU NEGATIVE 09/15/2016 1844   HGBUR NEGATIVE 09/15/2016 1844   BILIRUBINUR NEGATIVE 09/15/2016 1844   BILIRUBINUR Negative 01/17/2016 1531   KETONESUR NEGATIVE 09/15/2016 1844   PROTEINUR NEGATIVE 09/15/2016 1844   UROBILINOGEN 0.2 05/31/2011 2243   NITRITE NEGATIVE 09/15/2016 1844   LEUKOCYTESUR SMALL (A) 09/15/2016 1844   LEUKOCYTESUR Negative 01/17/2016 1531    Radiological Exams on Admission: Dg Chest 2 View  Result Date: 09/15/2016 CLINICAL DATA:  Cough and congestion.  Fever. EXAM: CHEST  2 VIEW COMPARISON:  November 06, 2015 FINDINGS: There is patchy infiltrate in the right middle lobe, better appreciated on the lateral view. Lungs elsewhere are clear. Heart size and pulmonary vascularity are normal. No adenopathy. No bone lesions. IMPRESSION: Patchy infiltrate right middle lobe.  Lungs elsewhere clear. Electronically Signed   By: Bretta Bang III M.D.  On: 09/15/2016 19:31    EKG: Independently reviewed.   Assessment/Plan Principal Problem:   CAP (community acquired pneumonia) Active Problems:   UTI (urinary tract infection)   PLAN:   CAP:  Will continue with IV Rocephin and Zithromax.  Continue with IV steroids and nebs  Will change her neb Tx using Xopenex, as she is a bit tachycardic.    UTI:  Incidentally found to have UTI as well.  Rocephin should cover it.     DVT prophylaxis: subQ Heparin.  Code Status: FULL CODE.  Family Communication: husband at bedside. Disposition Plan: home.  Consults called: None.  Admission status: Inpatient.    Dayanira Giovannetti MD FACP. [facility]  If 7PM-7AM, please contact night-coverage [email or web address] Password [email or web address]  09/15/2016, 9:07 PM

## 2016-09-15 NOTE — ED Provider Notes (Signed)
[facility]-EM[facility]ENCY DEPT Provider Note   CSN: [id number] Arrival date & time: 09/15/16  1742     History   Chief Complaint Chief Complaint  Patient presents with  . Cough    HPI Mary Hull is a 21 y.o. female.  HPI  The pt is a 21 y/o female - hx of Asthma - had a child 2 weeks ago - has been doing well until her sig other got sick this week - then pt got sick 2 days ago with sore throat and cough - yesterday felt better but today has had increased SOB, coughing and fever to 102 at home.  She has had phlegm and pain in the L back when she coughs and breaths.  She has tried Albuterol at home X 2 without relief.  She has not had any swelling of the legs or the feet and no hx of DVT / PE.  She reports that when she gets URI's in the past it usually causes some SOB with wheezing.  No prednisone in over a year.  Sx are constant and gradually worsening and were severe today prompting her visit to the ER.  Past Medical History:  Diagnosis Date  . Asthma   . Gestational diabetes    diet controlled    Patient Active Problem List   Diagnosis Date Noted  . Gestational diabetes mellitus, class A1 06/20/2016  . Abnormal findings on diagnostic imaging of gall bladder 03/27/2016  . Supervision of high risk pregnancy, antepartum 01/17/2016    Past Surgical History:  Procedure Laterality Date  . FRACTURE SURGERY     L elbow  . TONSILLECTOMY      OB History    Gravida Para Term Preterm AB Living   1 1 1     1    SAB TAB Ectopic Multiple Live Births         0 1       Home Medications    Prior to Admission medications   Medication Sig Start Date End Date Taking? Authorizing Provider  albuterol (PROVENTIL HFA;VENTOLIN HFA) 108 (90 Base) MCG/ACT inhaler Inhale 1-2 puffs into the lungs every 6 (six) hours as needed for wheezing or shortness of breath. 11/06/15   Vanetta Mulders, MD  albuterol (PROVENTIL) (2.5 MG/3ML) 0.083% nebulizer solution Take 3 mLs (2.5 mg total) by  nebulization every 4 (four) hours as needed for wheezing or shortness of breath. 04/09/15   Mancel Bale, MD  ibuprofen (ADVIL,MOTRIN) 600 MG tablet Take 1 tablet (600 mg total) by mouth every 6 (six) hours. 09/05/16   Cox, Mariann Laster, MD  Prenatal MV-Min-FA-Omega-3 (PRENATAL GUMMIES/DHA & FA) 0.4-32.5 MG CHEW Chew 2 each by mouth daily.    [provider]    Family History No family history on file.  Social History Social History  Substance Use Topics  . Smoking status: Former Smoker    Years: 1.00    Types: Cigarettes  . Smokeless tobacco: Never Used  . Alcohol use No     Allergies   Tussionex pennkinetic er [hydrocod polst-cpm polst er]   Review of Systems Review of Systems  All other systems reviewed and are negative.  Physical Exam Updated Vital Signs BP (!) 161/85 (BP Location: Right Arm)   Pulse (!) 142   Temp (!) 100.9 F (38.3 C) (Oral)   Resp (!) 24   LMP 11/28/2015 (Approximate)   SpO2 96%   Physical Exam  Constitutional: She appears well-developed and well-nourished. She appears distressed ( increased  WOB with wheezing ).  HENT:  Head: Normocephalic and atraumatic.  Mouth/Throat: Oropharynx is clear and moist. No oropharyngeal exudate.  Eyes: Pupils are equal, round, and reactive to light. Conjunctivae and EOM are normal. Right eye exhibits no discharge. Left eye exhibits no discharge. No scleral icterus.  Neck: Normal range of motion. Neck supple. No JVD present. No thyromegaly present.  Cardiovascular: Regular rhythm, normal heart sounds and intact distal pulses.  Exam reveals no gallop and no friction rub.   No murmur heard. Tachycardic to 140 bpm, strong pulses, no edema, no JVD  Pulmonary/Chest: She is in respiratory distress. She has wheezes. She has no rales.  Abdominal: Soft. Bowel sounds are normal. She exhibits no distension and no mass. There is no tenderness.  Musculoskeletal: Normal range of motion. She exhibits no edema or tenderness.    Lymphadenopathy:    She has no cervical adenopathy.  Neurological: She is alert. Coordination normal.  Skin: Skin is warm and dry. No rash noted. No erythema.  Psychiatric: She has a normal mood and affect. Her behavior is normal.  Nursing note and vitals reviewed.    ED Treatments / Results  Labs (all labs ordered are listed, but only abnormal results are displayed) Labs Reviewed  CULTURE, BLOOD (ROUTINE X 2)  CULTURE, BLOOD (ROUTINE X 2)  URINE CULTURE  COMPREHENSIVE METABOLIC PANEL  CBC WITH DIFFERENTIAL/PLATELET  URINALYSIS, ROUTINE W REFLEX MICROSCOPIC  I-STAT CG4 LACTIC ACID, ED    EKG  EKG Interpretation  Date/Time:  Sunday September 15 2016 18:03:22 EDT Ventricular Rate:  138 PR Interval:    QRS Duration: 84 QT Interval:  293 QTC Calculation: 444 R Axis:   113 Text Interpretation:  Right and left arm electrode reversal, interpretation assumes no reversal Sinus tachycardia Right axis deviation Abnormal T, consider ischemia, lateral leads Baseline wander in lead(s) V2 No old tracing to compare Confirmed by Eber Hong ([id number]) on 09/15/2016 6:17:07 PM       Radiology No results found.  Procedures Procedures (including critical care time)  CRITICAL CARE Performed by: Vida Roller Total critical care time: 35 minutes Critical care time was exclusive of separately billable procedures and treating other patients. Critical care was necessary to treat or prevent imminent or life-threatening deterioration. Critical care was time spent personally by me on the following activities: development of treatment plan with patient and/or surrogate as well as nursing, discussions with consultants, evaluation of patient's response to treatment, examination of patient, obtaining history from patient or surrogate, ordering and performing treatments and interventions, ordering and review of laboratory studies, ordering and review of radiographic studies, pulse oximetry and  re-evaluation of patient's condition.   Medications Ordered in ED Medications  0.9 %  sodium chloride infusion (not administered)  albuterol (PROVENTIL,VENTOLIN) solution continuous neb (not administered)  methylPREDNISolone sodium succinate (SOLU-MEDROL) 125 mg/2 mL injection 125 mg (not administered)  cefTRIAXone (ROCEPHIN) 1 g in dextrose 5 % 50 mL IVPB (not administered)  azithromycin (ZITHROMAX) tablet 500 mg (not administered)     Initial Impression / Assessment and Plan / ED Course  I have reviewed the triage vital signs and the nursing notes.  Pertinent labs & imaging results that were available during my care of the patient were reviewed by me and considered in my medical decision making (see chart for details).    Pt is ill appearing with an acute respiratory illness She has asthma though this could be more than reactive airway disease She has abnormal lungs sounds, fever  and tachycardia Labs ordered Code sepsis activated Antibiotics ordered to cover for pulmonary pathogens Pt in agreement with plan Continuous nebs and solmedrol ordered  Has elevated WBC Has PNA on xray Has normal renal function Has persistent severe tachyacrdia though felt better with nebs (more tachy after same) Given IV Abx Lactic ~2, no hypotension D/w Dr. Conley Rolls who will admit.  Final Clinical Impressions(s) / ED Diagnoses   Final diagnoses:  Sepsis, due to unspecified organism [facility])  Community acquired pneumonia of right middle lobe of lung (HCC)  Acute respiratory distress    New Prescriptions New Prescriptions   No medications on file     Eber Hong, MD 09/15/16 2100

## 2016-09-16 DIAGNOSIS — R0603 Acute respiratory distress: Secondary | ICD-10-CM

## 2016-09-16 DIAGNOSIS — N390 Urinary tract infection, site not specified: Secondary | ICD-10-CM

## 2016-09-16 DIAGNOSIS — J181 Lobar pneumonia, unspecified organism: Secondary | ICD-10-CM

## 2016-09-16 LAB — CBC
HCT: 32.6 % — ABNORMAL LOW (ref 36.0–46.0)
HEMOGLOBIN: 10.6 g/dL — AB (ref 12.0–15.0)
MCH: 27.5 pg (ref 26.0–34.0)
MCHC: 32.5 g/dL (ref 30.0–36.0)
MCV: 84.5 fL (ref 78.0–100.0)
PLATELETS: 384 10*3/uL (ref 150–400)
RBC: 3.86 MIL/uL — AB (ref 3.87–5.11)
RDW: 14 % (ref 11.5–15.5)
WBC: 12.5 10*3/uL — AB (ref 4.0–10.5)

## 2016-09-16 LAB — COMPREHENSIVE METABOLIC PANEL
ALT: 12 U/L — AB (ref 14–54)
ANION GAP: 10 (ref 5–15)
AST: 16 U/L (ref 15–41)
Albumin: 3.1 g/dL — ABNORMAL LOW (ref 3.5–5.0)
Alkaline Phosphatase: 84 U/L (ref 38–126)
BUN: 9 mg/dL (ref 6–20)
CHLORIDE: 108 mmol/L (ref 101–111)
CO2: 20 mmol/L — ABNORMAL LOW (ref 22–32)
CREATININE: 0.54 mg/dL (ref 0.44–1.00)
Calcium: 8.9 mg/dL (ref 8.9–10.3)
Glucose, Bld: 188 mg/dL — ABNORMAL HIGH (ref 65–99)
POTASSIUM: 3.6 mmol/L (ref 3.5–5.1)
Sodium: 138 mmol/L (ref 135–145)
Total Bilirubin: 0.2 mg/dL — ABNORMAL LOW (ref 0.3–1.2)
Total Protein: 7.1 g/dL (ref 6.5–8.1)

## 2016-09-16 MED ORDER — IPRATROPIUM-ALBUTEROL 0.5-2.5 (3) MG/3ML IN SOLN
3.0000 mL | RESPIRATORY_TRACT | Status: DC | PRN
  Administered 2016-09-16: 3 mL via RESPIRATORY_TRACT
  Filled 2016-09-16: qty 3

## 2016-09-16 MED ORDER — FAMOTIDINE IN NACL 20-0.9 MG/50ML-% IV SOLN
20.0000 mg | Freq: Two times a day (BID) | INTRAVENOUS | Status: DC
  Administered 2016-09-16: 20 mg via INTRAVENOUS
  Filled 2016-09-16: qty 50

## 2016-09-16 MED ORDER — SODIUM CHLORIDE 0.9 % IV SOLN
1000.0000 mL | INTRAVENOUS | Status: DC
  Administered 2016-09-16 – 2016-09-17 (×2): 1000 mL via INTRAVENOUS

## 2016-09-16 MED ORDER — IPRATROPIUM BROMIDE 0.02 % IN SOLN
RESPIRATORY_TRACT | Status: AC
  Administered 2016-09-16: 0.5 mg
  Filled 2016-09-16: qty 2.5

## 2016-09-16 MED ORDER — LEVALBUTEROL HCL 0.63 MG/3ML IN NEBU
0.6300 mg | INHALATION_SOLUTION | Freq: Four times a day (QID) | RESPIRATORY_TRACT | Status: DC
  Administered 2016-09-16 – 2016-09-17 (×3): 0.63 mg via RESPIRATORY_TRACT
  Filled 2016-09-16 (×3): qty 3

## 2016-09-16 MED ORDER — IPRATROPIUM BROMIDE 0.02 % IN SOLN: 0.5000 mg | Freq: Four times a day (QID) | RESPIRATORY_TRACT | Status: DC

## 2016-09-16 NOTE — Progress Notes (Signed)
PROGRESS NOTE    Mary Hull  YYT:[id number] DOB: [birth year] DOA: 09/15/2016 PCP: Patient, No Pcp Per     Brief Narrative:  21 year old female with history of asthma presents with a shortness of breath and cough was found to have CAP .  Assessment & Plan:  1-community-acquired pneumonia: Improving with IV steroids and IV ceftriaxone/Zithromax, continue the same management for 1 more day along with the breathing treatment ambulate. 2-possible UTI continue with IV Rocephin and follow-up with the cultures. 3-asthma exacerbation in the setting of pneumonia continue steroids and breathing treatment improving.   DVT prophylaxis: (Lovenox) Code Status: (Full/) Family Communication: (Husband Disposition Plan: home    Consultants:   None    Procedures:    Antimicrobials: (specify start and planned stop date. Auto populated tables are space occupying and do not give end dates)  Roci/Azithro     Subjective:  The patient reports subjective improvement still wheezing with shortness of breath with exertion , cough is better.  Objective: Vitals:   09/15/16 2111 09/15/16 2254 09/15/16 2342 09/16/16 0615  BP: 134/84 129/88  138/87  Pulse: (!) 137 (!) 128  (!) 103  Resp: (!) 22 20  20   Temp: 98.7 F (37.1 C) 99.2 F (37.3 C)  98.3 F (36.8 C)  TempSrc: Oral Oral  Oral  SpO2: 94% 95% 94% 93%  Weight:  89.5 kg (197 lb 5 oz)    Height:  5\' 1"  (1.549 m)      Intake/Output Summary (Last 24 hours) at 09/16/16 0829 Last data filed at 09/16/16 2751  Gross per 24 hour  Intake          2942.58 ml  Output                0 ml  Net          2942.58 ml   Filed Weights   09/15/16 2254  Weight: 89.5 kg (197 lb 5 oz)    Examination:  General exam: Appears calm and comfortable  Respiratory system: Clear to auscultation. Respiratory effort normal. Cardiovascular system: S1 & S2 heard, RRR. No JVD, murmurs, rubs, gallops or clicks. No pedal edema. Gastrointestinal  system: Abdomen is nondistended, soft and nontender. No organomegaly or masses felt. Normal bowel sounds heard. Central nervous system: Alert and oriented. No focal neurological deficits. Extremities: Symmetric 5 x 5 power. Skin: No rashes, lesions or ulcers Psychiatry: Judgement and insight appear normal. Mood & affect appropriate.     Data Reviewed: I have personally reviewed following labs and imaging studies  CBC:  Recent Labs Lab 09/15/16 1829 09/16/16 0706  WBC 12.5* 12.5*  NEUTROABS 10.6*  --   HGB 10.8* 10.6*  HCT 33.3* 32.6*  MCV 84.5 84.5  PLT PLATELET CLUMPS NOTED ON SMEAR, COUNT APPEARS ADEQUATE 384   Basic Metabolic Panel:  Recent Labs Lab 09/15/16 1829 09/16/16 0706  NA 139 138  K 3.7 3.6  CL 106 108  CO2 21* 20*  GLUCOSE 129* 188*  BUN 9 9  CREATININE 0.79 0.54  CALCIUM 9.2 8.9   GFR: Estimated Creatinine Clearance: 115.2 mL/min (by C-G formula based on SCr of 0.54 mg/dL). Liver Function Tests:  Recent Labs Lab 09/15/16 1829 09/16/16 0706  AST 21 16  ALT 15 12*  ALKPHOS 97 84  BILITOT 0.5 0.2*  PROT 7.5 7.1  ALBUMIN 3.4* 3.1*   No results for input(s): LIPASE, AMYLASE in the last 168 hours. No results for input(s): AMMONIA in the last 168 hours.  Coagulation Profile: No results for input(s): INR, PROTIME in the last 168 hours. Cardiac Enzymes: No results for input(s): CKTOTAL, CKMB, CKMBINDEX, TROPONINI in the last 168 hours. BNP (last 3 results) No results for input(s): PROBNP in the last 8760 hours. HbA1C: No results for input(s): HGBA1C in the last 72 hours. CBG: No results for input(s): GLUCAP in the last 168 hours. Lipid Profile: No results for input(s): CHOL, HDL, LDLCALC, TRIG, CHOLHDL, LDLDIRECT in the last 72 hours. Thyroid Function Tests:  Recent Labs  09/15/16 1829  TSH 1.143   Anemia Panel: No results for input(s): VITAMINB12, FOLATE, FERRITIN, TIBC, IRON, RETICCTPCT in the last 72 hours. Urine analysis:      Component Value Date/Time   COLORURINE YELLOW 09/15/2016 1844   APPEARANCEUR CLEAR 09/15/2016 1844   APPEARANCEUR Clear 01/17/2016 1531   LABSPEC 1.015 09/15/2016 1844   PHURINE 8.0 09/15/2016 1844   GLUCOSEU NEGATIVE 09/15/2016 1844   HGBUR NEGATIVE 09/15/2016 1844   BILIRUBINUR NEGATIVE 09/15/2016 1844   BILIRUBINUR Negative 01/17/2016 1531   KETONESUR NEGATIVE 09/15/2016 1844   PROTEINUR NEGATIVE 09/15/2016 1844   UROBILINOGEN 0.2 05/31/2011 2243   NITRITE NEGATIVE 09/15/2016 1844   LEUKOCYTESUR SMALL (A) 09/15/2016 1844   LEUKOCYTESUR Negative 01/17/2016 1531   Sepsis Labs: @LABRCNTIP (procalcitonin:4,lacticidven:4)  ) Recent Results (from the past 240 hour(s))  Blood Culture (routine x 2)     Status: None (Preliminary result)   Collection Time: 09/15/16  6:20 PM  Result Value Ref Range Status   Specimen Description LEFT ANTECUBITAL  Final   Special Requests Blood Culture adequate volume  Final   Culture NO GROWTH < 24 HOURS  Final   Report Status PENDING  Incomplete  Blood Culture (routine x 2)     Status: None (Preliminary result)   Collection Time: 09/15/16  6:28 PM  Result Value Ref Range Status   Specimen Description BLOOD RIGHT HAND  Final   Special Requests   Final    Blood Culture results may not be optimal due to an inadequate volume of blood received in culture bottles   Culture NO GROWTH < 24 HOURS  Final   Report Status PENDING  Incomplete         Radiology Studies: Dg Chest 2 View  Result Date: 09/15/2016 CLINICAL DATA:  Cough and congestion.  Fever. EXAM: CHEST  2 VIEW COMPARISON:  November 06, 2015 FINDINGS: There is patchy infiltrate in the right middle lobe, better appreciated on the lateral view. Lungs elsewhere are clear. Heart size and pulmonary vascularity are normal. No adenopathy. No bone lesions. IMPRESSION: Patchy infiltrate right middle lobe.  Lungs elsewhere clear. Electronically Signed   By: Bretta Bang III M.D.   On: 09/15/2016 19:31         Scheduled Meds: . enoxaparin (LOVENOX) injection  45 mg Subcutaneous Q24H  . ipratropium  0.5 mg Nebulization Q6H  . levalbuterol  0.63 mg Nebulization Q6H  . methylPREDNISolone (SOLU-MEDROL) injection  60 mg Intravenous Q6H  . sodium chloride flush  3 mL Intravenous Q12H   Continuous Infusions: . sodium chloride    . azithromycin    . cefTRIAXone (ROCEPHIN)  IV       LOS: 1 day    Time spent: 35 m    Efrain Sella, MD [facility] Pager [phone]  If 7PM-7AM, please contact night-coverage [email or web address] Password [email or web address] 09/16/2016, 8:29 AM

## 2016-09-17 DIAGNOSIS — A419 Sepsis, unspecified organism: Secondary | ICD-10-CM

## 2016-09-17 LAB — URINE CULTURE: CULTURE: NO GROWTH

## 2016-09-17 MED ORDER — PREDNISONE 20 MG PO TABS: 40.0000 mg | ORAL_TABLET | Freq: Every day | ORAL | 0 refills | Status: AC

## 2016-09-17 MED ORDER — LEVOFLOXACIN 500 MG PO TABS: 500.0000 mg | ORAL_TABLET | Freq: Every day | ORAL | 0 refills | Status: AC

## 2016-09-17 NOTE — Progress Notes (Signed)
Pt discharged home today per Dr. Sabia. Pt's IV site D/C'd and WDL. Pt's VSS. Pt provided with home medication list, discharge instructions and prescriptions. Verbalized understanding. Pt left floor via WC in stable condition accompanied by NT. 

## 2016-09-17 NOTE — Care Management Note (Addendum)
Case Management Note  Patient Details  Name: Mary Hull MRN: [id number] Date of Birth: Aug 07, 1995  Subjective/Objective:                  Admitted with CAP. Chart reviewed for CM needs. Pt recently had baby, from home, has insurance with drug coverage, can go to [facility] for f/u as that is who is assigned on medicaid card. Pt lives with family. No needs noted prior to DC.   Action/Plan: DC home today.   Expected Discharge Date:  09/17/16               Expected Discharge Plan:  Home/Self Care  In-House Referral:  NA  Discharge planning Services  CM Consult  Post Acute Care Choice:  NA Choice offered to:  NA  Status of Service:  Completed, signed off  Malcolm Metro, RN 09/17/2016, 11:07 AM

## 2016-09-17 NOTE — Discharge Summary (Signed)
Physician Discharge Summary  Mary Hull ZOX:[id number] DOB: 1995/04/05 DOA: 09/15/2016  PCP: Patient, No Pcp Per  Admit date: 09/15/2016 Discharge date: 09/17/2016  Admitted From: Home  Disposition:  (Home  Recommendations for Outpatient Follow-up:  1. Follow up with PCP in 1-2 weeks   Home Health:NO   Equipment/Devices:no  Discharge Condition Stable   CODE STATUS: FULL  Diet recommendation: regular diet   Brief/Interim Summary:  21 year old female with history of asthma presents with a shortness of breath and cough was found to have CAP , Improved  with IV steroids and IV ceftriaxone/Zithromax . Will be discharged to complete course of treatment with oral Levaquin and Prednisone . I instructed her to stop the breast feeding while on po Abx and steriods.     Discharge Diagnoses:     1-community-acquired pneumonia.  2-asthma exacerbation in the setting of pneumonia .     Discharge Instructions  Discharge Instructions    Diet general    Complete by:  As directed    Increase activity slowly    Complete by:  As directed      Allergies as of 09/17/2016      Reactions   Tussionex Pennkinetic Er [hydrocod Polst-cpm Polst Er] Hives, Swelling, Other (See Comments)   Reaction:  Unspecified swelling reaction       Medication List    STOP taking these medications   ibuprofen 600 MG tablet Commonly known as:  ADVIL,MOTRIN   pseudoephedrine 30 MG tablet Commonly known as:  SUDAFED     TAKE these medications   albuterol (2.5 MG/3ML) 0.083% nebulizer solution Commonly known as:  PROVENTIL Take 3 mLs (2.5 mg total) by nebulization every 4 (four) hours as needed for wheezing or shortness of breath.   albuterol 108 (90 Base) MCG/ACT inhaler Commonly known as:  PROVENTIL HFA;VENTOLIN HFA Inhale 1-2 puffs into the lungs every 6 (six) hours as needed for wheezing or shortness of breath.   dextromethorphan-guaiFENesin 30-600 MG 12hr tablet Commonly known  as:  MUCINEX DM Take 1 tablet by mouth 2 (two) times daily as needed for cough.   ferrous sulfate 325 (65 FE) MG EC tablet Take 325 mg by mouth daily.   levofloxacin 500 MG tablet Commonly known as:  LEVAQUIN Take 1 tablet (500 mg total) by mouth daily.   predniSONE 20 MG tablet Commonly known as:  DELTASONE Take 2 tablets (40 mg total) by mouth daily with breakfast.       Allergies  Allergen Reactions  . Tussionex Pennkinetic Er [Hydrocod Polst-Cpm Polst Er] Hives, Swelling and Other (See Comments)    Reaction:  Unspecified swelling reaction     Consultations: None   Procedures/Studies: Dg Chest 2 View  Result Date: 09/15/2016 CLINICAL DATA:  Cough and congestion.  Fever. EXAM: CHEST  2 VIEW COMPARISON:  November 06, 2015 FINDINGS: There is patchy infiltrate in the right middle lobe, better appreciated on the lateral view. Lungs elsewhere are clear. Heart size and pulmonary vascularity are normal. No adenopathy. No bone lesions. IMPRESSION: Patchy infiltrate right middle lobe.  Lungs elsewhere clear. Electronically Signed   By: Bretta Bang III M.D.   On: 09/15/2016 19:31    (Echo, Carotid, EGD, Colonoscopy, ERCP)    Subjective:   Discharge Exam: Vitals:   09/17/16 0604 09/17/16 0734  BP: 138/81   Pulse: 81   Resp: 16   Temp: 98.3 F (36.8 C)   SpO2: 92% 95%   Vitals:   09/16/16 2020 09/17/16 0153 09/17/16 8119  09/17/16 0734  BP: (!) 155/83  138/81   Pulse: (!) 115  81   Resp: 18  16   Temp: 98 F (36.7 C)  98.3 F (36.8 C)   TempSrc: Oral  Oral   SpO2: 92% 91% 92% 95%  Weight:      Height:        General: Pt is alert, awake, not in acute distress Cardiovascular: RRR, S1/S2 +, no rubs, no gallops Respiratory: CTA bilaterally, no wheezing, no rhonchi Abdominal: Soft, NT, ND, bowel sounds + Extremities: no edema, no cyanosis    The results of significant diagnostics from this hospitalization (including imaging, microbiology, ancillary and  laboratory) are listed below for reference.     Microbiology: Recent Results (from the past 240 hour(s))  Blood Culture (routine x 2)     Status: None (Preliminary result)   Collection Time: 09/15/16  6:20 PM  Result Value Ref Range Status   Specimen Description LEFT ANTECUBITAL  Final   Special Requests Blood Culture adequate volume  Final   Culture NO GROWTH 2 DAYS  Final   Report Status PENDING  Incomplete  Blood Culture (routine x 2)     Status: None (Preliminary result)   Collection Time: 09/15/16  6:28 PM  Result Value Ref Range Status   Specimen Description BLOOD RIGHT HAND  Final   Special Requests   Final    Blood Culture results may not be optimal due to an inadequate volume of blood received in culture bottles   Culture NO GROWTH 2 DAYS  Final   Report Status PENDING  Incomplete  Urine culture     Status: None   Collection Time: 09/15/16  6:44 PM  Result Value Ref Range Status   Specimen Description URINE, CLEAN CATCH  Final   Special Requests NONE  Final   Culture   Final    NO GROWTH Performed at [facility], [address], [place], [place] [address]    Report Status 09/17/2016 FINAL  Final     Labs: BNP (last 3 results) No results for input(s): BNP in the last 8760 hours. Basic Metabolic Panel:  Recent Labs Lab 09/15/16 1829 09/16/16 0706  NA 139 138  K 3.7 3.6  CL 106 108  CO2 21* 20*  GLUCOSE 129* 188*  BUN 9 9  CREATININE 0.79 0.54  CALCIUM 9.2 8.9   Liver Function Tests:  Recent Labs Lab 09/15/16 1829 09/16/16 0706  AST 21 16  ALT 15 12*  ALKPHOS 97 84  BILITOT 0.5 0.2*  PROT 7.5 7.1  ALBUMIN 3.4* 3.1*   No results for input(s): LIPASE, AMYLASE in the last 168 hours. No results for input(s): AMMONIA in the last 168 hours. CBC:  Recent Labs Lab 09/15/16 1829 09/16/16 0706  WBC 12.5* 12.5*  NEUTROABS 10.6*  --   HGB 10.8* 10.6*  HCT 33.3* 32.6*  MCV 84.5 84.5  PLT PLATELET CLUMPS NOTED ON SMEAR, COUNT APPEARS ADEQUATE  384   Cardiac Enzymes: No results for input(s): CKTOTAL, CKMB, CKMBINDEX, TROPONINI in the last 168 hours. BNP: Invalid input(s): POCBNP CBG: No results for input(s): GLUCAP in the last 168 hours. D-Dimer No results for input(s): DDIMER in the last 72 hours. Hgb A1c No results for input(s): HGBA1C in the last 72 hours. Lipid Profile No results for input(s): CHOL, HDL, LDLCALC, TRIG, CHOLHDL, LDLDIRECT in the last 72 hours. Thyroid function studies  Recent Labs  09/15/16 1829  TSH 1.143   Anemia work  up No results for input(s): VITAMINB12, FOLATE, FERRITIN, TIBC, IRON, RETICCTPCT in the last 72 hours. Urinalysis    Component Value Date/Time   COLORURINE YELLOW 09/15/2016 1844   APPEARANCEUR CLEAR 09/15/2016 1844   APPEARANCEUR Clear 01/17/2016 1531   LABSPEC 1.015 09/15/2016 1844   PHURINE 8.0 09/15/2016 1844   GLUCOSEU NEGATIVE 09/15/2016 1844   HGBUR NEGATIVE 09/15/2016 1844   BILIRUBINUR NEGATIVE 09/15/2016 1844   BILIRUBINUR Negative 01/17/2016 1531   KETONESUR NEGATIVE 09/15/2016 1844   PROTEINUR NEGATIVE 09/15/2016 1844   UROBILINOGEN 0.2 05/31/2011 2243   NITRITE NEGATIVE 09/15/2016 1844   LEUKOCYTESUR SMALL (A) 09/15/2016 1844   LEUKOCYTESUR Negative 01/17/2016 1531   Sepsis Labs Invalid input(s): PROCALCITONIN,  WBC,  LACTICIDVEN Microbiology Recent Results (from the past 240 hour(s))  Blood Culture (routine x 2)     Status: None (Preliminary result)   Collection Time: 09/15/16  6:20 PM  Result Value Ref Range Status   Specimen Description LEFT ANTECUBITAL  Final   Special Requests Blood Culture adequate volume  Final   Culture NO GROWTH 2 DAYS  Final   Report Status PENDING  Incomplete  Blood Culture (routine x 2)     Status: None (Preliminary result)   Collection Time: 09/15/16  6:28 PM  Result Value Ref Range Status   Specimen Description BLOOD RIGHT HAND  Final   Special Requests   Final    Blood Culture results may not be optimal due to an  inadequate volume of blood received in culture bottles   Culture NO GROWTH 2 DAYS  Final   Report Status PENDING  Incomplete  Urine culture     Status: None   Collection Time: 09/15/16  6:44 PM  Result Value Ref Range Status   Specimen Description URINE, CLEAN CATCH  Final   Special Requests NONE  Final   Culture   Final    NO GROWTH Performed at [facility], [address], [place], [place] [address]    Report Status 09/17/2016 FINAL  Final     Time coordinating discharge: Over 30 minutes  SIGNED:   Efrain Sella, MD  [facility] 09/17/2016, 10:50 AM Pager   If 7PM-7AM, please contact night-coverage [email or web address] Password [email or web address]

## 2016-09-20 LAB — CULTURE, BLOOD (ROUTINE X 2)
CULTURE: NO GROWTH
Culture: NO GROWTH
Special Requests: ADEQUATE

## 2016-09-26 ENCOUNTER — Encounter: Payer: Self-pay | Admitting: Advanced Practice Midwife

## 2016-10-10 ENCOUNTER — Ambulatory Visit: Payer: Medicaid Other | Admitting: Advanced Practice Midwife

## 2016-10-11 ENCOUNTER — Encounter: Payer: Self-pay | Admitting: Women's Health

## 2016-10-11 ENCOUNTER — Ambulatory Visit (INDEPENDENT_AMBULATORY_CARE_PROVIDER_SITE_OTHER): Payer: Medicaid Other | Admitting: Women's Health

## 2016-10-11 ENCOUNTER — Ambulatory Visit (INDEPENDENT_AMBULATORY_CARE_PROVIDER_SITE_OTHER): Payer: Medicaid Other | Admitting: *Deleted

## 2016-10-11 ENCOUNTER — Encounter: Payer: Self-pay | Admitting: *Deleted

## 2016-10-11 DIAGNOSIS — Z3042 Encounter for surveillance of injectable contraceptive: Secondary | ICD-10-CM | POA: Diagnosis not present

## 2016-10-11 DIAGNOSIS — Z3202 Encounter for pregnancy test, result negative: Secondary | ICD-10-CM | POA: Diagnosis not present

## 2016-10-11 DIAGNOSIS — F53 Postpartum depression: Secondary | ICD-10-CM | POA: Insufficient documentation

## 2016-10-11 DIAGNOSIS — Z308 Encounter for other contraceptive management: Secondary | ICD-10-CM

## 2016-10-11 DIAGNOSIS — Z8632 Personal history of gestational diabetes: Secondary | ICD-10-CM

## 2016-10-11 DIAGNOSIS — O99345 Other mental disorders complicating the puerperium: Secondary | ICD-10-CM

## 2016-10-11 LAB — POCT URINE PREGNANCY: PREG TEST UR: NEGATIVE

## 2016-10-11 MED ORDER — ESCITALOPRAM OXALATE 10 MG PO TABS: 10.0000 mg | ORAL_TABLET | Freq: Every day | ORAL | 6 refills | Status: DC

## 2016-10-11 MED ORDER — MEDROXYPROGESTERONE ACETATE 150 MG/ML IM SUSP
150.0000 mg | Freq: Once | INTRAMUSCULAR | Status: AC
  Administered 2016-10-11: 150 mg via INTRAMUSCULAR

## 2016-10-11 MED ORDER — MEDROXYPROGESTERONE ACETATE 150 MG/ML IM SUSP: 150.0000 mg | INTRAMUSCULAR | 3 refills | Status: DC

## 2016-10-11 NOTE — Patient Instructions (Signed)
You will have your sugar test in about 7 weeks.  Please do not eat or drink anything after midnight the night before you come, not even water.  You will be here for at least two hours.     Postpartum Depression and Baby Blues The postpartum period begins right after the birth of a baby. During this time, there is often a great amount of joy and excitement. It is also a time of many changes in the life of the parents. Regardless of how many times a mother gives birth, each child brings new challenges and dynamics to the family. It is not unusual to have feelings of excitement along with confusing shifts in moods, emotions, and thoughts. All mothers are at risk of developing postpartum depression or the "baby blues." These mood changes can occur right after giving birth, or they may occur many months after giving birth. The baby blues or postpartum depression can be mild or severe. Additionally, postpartum depression can go away rather quickly, or it can be a long-term condition. What are the causes? Raised hormone levels and the rapid drop in those levels are thought to be a main cause of postpartum depression and the baby blues. A number of hormones change during and after pregnancy. Estrogen and progesterone usually decrease right after the delivery of your baby. The levels of thyroid hormone and various cortisol steroids also rapidly drop. Other factors that play a role in these mood changes include major life events and genetics. What increases the risk? If you have any of the following risks for the baby blues or postpartum depression, know what symptoms to watch out for during the postpartum period. Risk factors that may increase the likelihood of getting the baby blues or postpartum depression include:  Having a personal or family history of depression.  Having depression while being pregnant.  Having premenstrual mood issues or mood issues related to oral contraceptives.  Having a lot of life  stress.  Having marital conflict.  Lacking a social support network.  Having a baby with special needs.  Having health problems, such as diabetes.  What are the signs or symptoms? Symptoms of baby blues include:  Brief changes in mood, such as going from extreme happiness to sadness.  Decreased concentration.  Difficulty sleeping.  Crying spells, tearfulness.  Irritability.  Anxiety.  Symptoms of postpartum depression typically begin within the first month after giving birth. These symptoms include:  Difficulty sleeping or excessive sleepiness.  Marked weight loss.  Agitation.  Feelings of worthlessness.  Lack of interest in activity or food.  Postpartum psychosis is a very serious condition and can be dangerous. Fortunately, it is rare. Displaying any of the following symptoms is cause for immediate medical attention. Symptoms of postpartum psychosis include:  Hallucinations and delusions.  Bizarre or disorganized behavior.  Confusion or disorientation.  How is this diagnosed? A diagnosis is made by an evaluation of your symptoms. There are no medical or lab tests that lead to a diagnosis, but there are various questionnaires that a health care provider may use to identify those with the baby blues, postpartum depression, or psychosis. Often, a screening tool called the [place] Postnatal Depression Scale is used to diagnose depression in the postpartum period. How is this treated? The baby blues usually goes away on its own in 1-2 weeks. Social support is often all that is needed. You will be encouraged to get adequate sleep and rest. Occasionally, you may be given medicines to help you sleep.  Postpartum depression requires treatment because it can last several months or longer if it is not treated. Treatment may include individual or group therapy, medicine, or both to address any social, physiological, and psychological factors that may play a role in the  depression. Regular exercise, a healthy diet, rest, and social support may also be strongly recommended. Postpartum psychosis is more serious and needs treatment right away. Hospitalization is often needed. Follow these instructions at home:  Get as much rest as you can. Nap when the baby sleeps.  Exercise regularly. Some women find yoga and walking to be beneficial.  Eat a balanced and nourishing diet.  Do little things that you enjoy. Have a cup of tea, take a bubble bath, read your favorite magazine, or listen to your favorite music.  Avoid alcohol.  Ask for help with household chores, cooking, grocery shopping, or running errands as needed. Do not try to do everything.  Talk to people close to you about how you are feeling. Get support from your partner, family members, friends, or other new moms.  Try to stay positive in how you think. Think about the things you are grateful for.  Do not spend a lot of time alone.  Only take over-the-counter or prescription medicine as directed by your health care provider.  Keep all your postpartum appointments.  Let your health care provider know if you have any concerns. Contact a health care provider if: You are having a reaction to or problems with your medicine. Get help right away if:  You have suicidal feelings.  You think you may harm the baby or someone else. This information is not intended to replace advice given to you by your health care provider. Make sure you discuss any questions you have with your health care provider. Document Released: 10/19/2003 Document Revised: 06/22/2015 Document Reviewed: 10/26/2012 Elsevier Interactive Patient Education  2017 [employer]  Medroxyprogesterone injection [Contraceptive] What is this medicine? MEDROXYPROGESTERONE (me DROX ee proe JES te rone) contraceptive injections prevent pregnancy. They provide effective birth control for 3 months. Depo-subQ Provera 104 is also used for  treating pain related to endometriosis. This medicine may be used for other purposes; ask your health care provider or pharmacist if you have questions. COMMON BRAND NAME(S): Depo-Provera, Depo-subQ Provera 104 What should I tell my health care provider before I take this medicine? They need to know if you have any of these conditions: -frequently drink alcohol -asthma -blood vessel disease or a history of a blood clot in the lungs or legs -bone disease such as osteoporosis -breast cancer -diabetes -eating disorder (anorexia nervosa or bulimia) -high blood pressure -HIV infection or AIDS -kidney disease -liver disease -mental depression -migraine -seizures (convulsions) -stroke -tobacco smoker -vaginal bleeding -an unusual or allergic reaction to medroxyprogesterone, other hormones, medicines, foods, dyes, or preservatives -pregnant or trying to get pregnant -breast-feeding How should I use this medicine? Depo-Provera Contraceptive injection is given into a muscle. Depo-subQ Provera 104 injection is given under the skin. These injections are given by a health care professional. You must not be pregnant before getting an injection. The injection is usually given during the first 5 days after the start of a menstrual period or 6 weeks after delivery of a baby. Talk to your pediatrician regarding the use of this medicine in children. Special care may be needed. These injections have been used in female children who have started having menstrual periods. Overdosage: If you think you have taken too much of this medicine  contact a poison control center or emergency room at once. NOTE: This medicine is only for you. Do not share this medicine with others. What if I miss a dose? Try not to miss a dose. You must get an injection once every 3 months to maintain birth control. If you cannot keep an appointment, call and reschedule it. If you wait longer than 13 weeks between Depo-Provera  contraceptive injections or longer than 14 weeks between Depo-subQ Provera 104 injections, you could get pregnant. Use another method for birth control if you miss your appointment. You may also need a pregnancy test before receiving another injection. What may interact with this medicine? Do not take this medicine with any of the following medications: -bosentan This medicine may also interact with the following medications: -aminoglutethimide -antibiotics or medicines for infections, especially rifampin, rifabutin, rifapentine, and griseofulvin -aprepitant -barbiturate medicines such as phenobarbital or primidone -bexarotene -carbamazepine -medicines for seizures like ethotoin, felbamate, oxcarbazepine, phenytoin, topiramate -modafinil -St. John's wort This list may not describe all possible interactions. Give your health care provider a list of all the medicines, herbs, non-prescription drugs, or dietary supplements you use. Also tell them if you smoke, drink alcohol, or use illegal drugs. Some items may interact with your medicine. What should I watch for while using this medicine? This drug does not protect you against HIV infection (AIDS) or other sexually transmitted diseases. Use of this product may cause you to lose calcium from your bones. Loss of calcium may cause weak bones (osteoporosis). Only use this product for more than 2 years if other forms of birth control are not right for you. The longer you use this product for birth control the more likely you will be at risk for weak bones. Ask your health care professional how you can keep strong bones. You may have a change in bleeding pattern or irregular periods. Many females stop having periods while taking this drug. If you have received your injections on time, your chance of being pregnant is very low. If you think you may be pregnant, see your health care professional as soon as possible. Tell your health care professional if you  want to get pregnant within the next year. The effect of this medicine may last a long time after you get your last injection. What side effects may I notice from receiving this medicine? Side effects that you should report to your doctor or health care professional as soon as possible: -allergic reactions like skin rash, itching or hives, swelling of the face, lips, or tongue -breast tenderness or discharge -breathing problems -changes in vision -depression -feeling faint or lightheaded, falls -fever -pain in the abdomen, chest, groin, or leg -problems with balance, talking, walking -unusually weak or tired -yellowing of the eyes or skin Side effects that usually do not require medical attention (report to your doctor or health care professional if they continue or are bothersome): -acne -fluid retention and swelling -headache -irregular periods, spotting, or absent periods -temporary pain, itching, or skin reaction at site where injected -weight gain This list may not describe all possible side effects. Call your doctor for medical advice about side effects. You may report side effects to FDA at 1-800-FDA-1088. Where should I keep my medicine? This does not apply. The injection will be given to you by a health care professional. NOTE: This sheet is a summary. It may not cover all possible information. If you have questions about this medicine, talk to your doctor, pharmacist, or health care  provider.  2018 Elsevier/Gold Standard (2008-02-05 18:37:56)

## 2016-10-11 NOTE — Progress Notes (Signed)
Pt here for Depo. Pt tolerated shot well. Return in 12 weeks for next shot. JSY 

## 2016-10-11 NOTE — Progress Notes (Signed)
Subjective:    Mary Hull is a 21 y.o. G14P1001 Caucasian female who presents for a postpartum visit. She is 5 weeks postpartum following a spontaneous vaginal delivery at 40.1 gestational weeks after IOL for A1DM. Anesthesia: epidural. I have fully reviewed the prenatal and intrapartum course. Postpartum course has been complicated by 3d hospitalization for pneumonia 1wk after SVB-feels much better now. Baby's course has been uncomplicated. Baby is feeding by bottle. Bleeding no bleeding. Bowel function is normal. Bladder function is normal. Patient is not sexually active. Last sexual activity: prior to birth of baby. Contraception method is wants depo- discussed it can potentially worsend depression- still wants to try. Postpartum depression screening: positive. Score 14. Decreased appetite, not sleeping well, still somewhat finds joy in things she used to, just feels she has more bad days than good. Denies SI/HI/II. No h/o dep/anx prior to now. Discussed options of expectant management vs +/-meds and +/- therapy/counseling- wants to try meds, will think about therapy.  Last pap <21yo.  The following portions of the patient's history were reviewed and updated as appropriate: allergies, current medications, past medical history, past surgical history and problem list.  Review of Systems Pertinent items are noted in HPI.   Vitals:   10/11/16 0948  BP: 130/82  Pulse: 100  Weight: 199 lb 8 oz (90.5 kg)  Height:  (1.549 m)   No LMP recorded.  Objective:   General:  alert, cooperative and no distress   Breasts:  deferred, no complaints  Lungs: clear to auscultation bilaterally  Heart:  regular rate and rhythm  Abdomen: soft, nontender   Vulva: Normal, removed few small remnants of suture at urethra   Vagina: normal vagina  Cervix:  closed  Corpus: Well-involuted  Adnexa:  Non-palpable  Rectal Exam: No hemorrhoids        Assessment:   Postpartum exam 5 wks s/p SVB after IOL  for A1DM Bottlefeeding Depression screening Postpartum depression Contraception counseling   Plan:  Contraception: rx depo w/ 3RF  Rx Lexapro  daily, call us if decides for counseling/therapy Follow up in: today for depo, then 4wks for f/u on Lexapro, then 7wks from now for sugar test only  Marge Duncans CNM, [facility] 10/11/2016 9:56 AM

## 2016-11-08 ENCOUNTER — Ambulatory Visit: Payer: Medicaid Other | Admitting: Adult Health

## 2016-11-13 ENCOUNTER — Ambulatory Visit (INDEPENDENT_AMBULATORY_CARE_PROVIDER_SITE_OTHER): Payer: Medicaid Other | Admitting: Adult Health

## 2016-11-13 ENCOUNTER — Encounter: Payer: Self-pay | Admitting: Adult Health

## 2016-11-13 VITALS — BP 100/70 | HR 87 | Ht 61.0 in | Wt 200.0 lb

## 2016-11-13 DIAGNOSIS — O99345 Other mental disorders complicating the puerperium: Principal | ICD-10-CM

## 2016-11-13 DIAGNOSIS — F53 Postpartum depression: Secondary | ICD-10-CM

## 2016-11-13 MED ORDER — ESCITALOPRAM OXALATE 20 MG PO TABS: 20.0000 mg | ORAL_TABLET | Freq: Every day | ORAL | 6 refills | Status: DC

## 2016-11-13 NOTE — Progress Notes (Signed)
Subjective:     Patient ID: Mary Hull, female   DOB: 11/16/1995, 21 y.o.   MRN: [id number]  HPI Mary Hull is a 21 year old white female, back in follow up on starting lexapro,   Review of Systems +Frequent headaches +Dizzy at times, no congestion +Not sleeping well  Reviewed past medical,surgical, social and family history. Reviewed medications and allergies.     Objective:   Physical Exam BP 100/70 (BP Location: Left Arm, Patient Position: Sitting, Cuff Size: Normal)   Pulse 87   Ht 5\' 1"  (1.549 m)   Wt 200 lb (90.7 kg)   Breastfeeding? No   BMI 37.79 kg/m Skin warm and dry, ears clear, no sinus tenderness. PHQ 9 score 9, denies being suicidal, is on Lexapro 10 mg, score on EPDS was 14 on 10/11/16. Will increase to 20 mg , and encouraged to take time for self.     Assessment:     1. Postpartum depression       Plan:     Meds ordered this encounter  Medications  . escitalopram (LEXAPRO) 20 MG tablet    Sig: Take 1 tablet (20 mg total) by mouth daily.    Dispense:  30 tablet    Refill:  6    Order Specific Question:   Supervising Provider    Answer:   Mary Hull [[id number]]  Eat often, and increase fluids  Take time for self Keep labs appt for sugar test in November  F/U in 4 weeks

## 2016-11-13 NOTE — Patient Instructions (Addendum)
Eat often Drink plenty fluids F./U in 4 weeks  Take time for self

## 2016-11-29 ENCOUNTER — Other Ambulatory Visit: Payer: Self-pay

## 2016-11-29 DIAGNOSIS — Z8632 Personal history of gestational diabetes: Secondary | ICD-10-CM

## 2016-11-30 LAB — GLUCOSE TOLERANCE, 2 HOURS W/ 1HR
GLUCOSE, 1 HOUR: 88 mg/dL (ref 65–179)
GLUCOSE, FASTING: 94 mg/dL — AB (ref 65–91)
Glucose, 2 hour: 91 mg/dL (ref 65–152)

## 2016-12-05 ENCOUNTER — Other Ambulatory Visit: Payer: Self-pay | Admitting: Women's Health

## 2016-12-05 ENCOUNTER — Telehealth: Payer: Self-pay | Admitting: *Deleted

## 2016-12-05 DIAGNOSIS — R7309 Other abnormal glucose: Secondary | ICD-10-CM

## 2016-12-05 NOTE — Telephone Encounter (Signed)
LMOVM to return call.

## 2016-12-10 ENCOUNTER — Encounter: Payer: Self-pay | Admitting: Women's Health

## 2016-12-11 ENCOUNTER — Ambulatory Visit: Payer: Medicaid Other | Admitting: Women's Health

## 2016-12-12 ENCOUNTER — Other Ambulatory Visit: Payer: Self-pay | Admitting: Women's Health

## 2016-12-12 ENCOUNTER — Encounter: Payer: Self-pay | Admitting: Women's Health

## 2016-12-12 DIAGNOSIS — R7303 Prediabetes: Secondary | ICD-10-CM

## 2016-12-12 LAB — HEMOGLOBIN A1C
ESTIMATED AVERAGE GLUCOSE: 120 mg/dL
Hgb A1c MFr Bld: 5.8 % — ABNORMAL HIGH (ref 4.8–5.6)

## 2017-01-03 ENCOUNTER — Ambulatory Visit: Payer: Medicaid Other

## 2017-04-06 ENCOUNTER — Emergency Department (HOSPITAL_COMMUNITY)
Admission: EM | Admit: 2017-04-06 | Discharge: 2017-04-06 | Disposition: A | Discharge status: Home / Self Care | Payer: Self-pay | Attending: Emergency Medicine | Admitting: Emergency Medicine

## 2017-04-06 ENCOUNTER — Encounter (HOSPITAL_COMMUNITY): Payer: Self-pay | Admitting: *Deleted

## 2017-04-06 DIAGNOSIS — J45909 Unspecified asthma, uncomplicated: Secondary | ICD-10-CM | POA: Insufficient documentation

## 2017-04-06 DIAGNOSIS — R110 Nausea: Secondary | ICD-10-CM

## 2017-04-06 DIAGNOSIS — R112 Nausea with vomiting, unspecified: Secondary | ICD-10-CM | POA: Insufficient documentation

## 2017-04-06 DIAGNOSIS — F17210 Nicotine dependence, cigarettes, uncomplicated: Secondary | ICD-10-CM | POA: Insufficient documentation

## 2017-04-06 LAB — COMPREHENSIVE METABOLIC PANEL
ALBUMIN: 4.1 g/dL (ref 3.5–5.0)
ALK PHOS: 90 U/L (ref 38–126)
ALT: 20 U/L (ref 14–54)
AST: 18 U/L (ref 15–41)
Anion gap: 10 (ref 5–15)
BUN: 10 mg/dL (ref 6–20)
CALCIUM: 9.3 mg/dL (ref 8.9–10.3)
CHLORIDE: 103 mmol/L (ref 101–111)
CO2: 25 mmol/L (ref 22–32)
CREATININE: 0.54 mg/dL (ref 0.44–1.00)
GFR calc Af Amer: >60 mL/min (ref >60)
GFR calc non Af Amer: >60 mL/min (ref >60)
GLUCOSE: 105 mg/dL — AB (ref 65–99)
Potassium: 4 mmol/L (ref 3.5–5.1)
SODIUM: 138 mmol/L (ref 135–145)
Total Bilirubin: 0.7 mg/dL (ref 0.3–1.2)
Total Protein: 7.8 g/dL (ref 6.5–8.1)

## 2017-04-06 LAB — CBC
HCT: 44.8 % (ref 36.0–46.0)
HEMOGLOBIN: 14.1 g/dL (ref 12.0–15.0)
MCH: 25.2 pg — AB (ref 26.0–34.0)
MCHC: 31.5 g/dL (ref 30.0–36.0)
MCV: 80 fL (ref 78.0–100.0)
Platelets: 269 10*3/uL (ref 150–400)
RBC: 5.6 MIL/uL — AB (ref 3.87–5.11)
RDW: 15.8 % — ABNORMAL HIGH (ref 11.5–15.5)
WBC: 10.9 10*3/uL — ABNORMAL HIGH (ref 4.0–10.5)

## 2017-04-06 LAB — URINALYSIS, ROUTINE W REFLEX MICROSCOPIC
BILIRUBIN URINE: NEGATIVE
GLUCOSE, UA: NEGATIVE mg/dL
HGB URINE DIPSTICK: NEGATIVE
Ketones, ur: 20 mg/dL — AB
Leukocytes, UA: NEGATIVE
Nitrite: NEGATIVE
Protein, ur: NEGATIVE mg/dL
SPECIFIC GRAVITY, URINE: 1.024 (ref 1.005–1.030)
pH: 5 (ref 5.0–8.0)

## 2017-04-06 LAB — PREGNANCY, URINE: PREG TEST UR: NEGATIVE

## 2017-04-06 MED ORDER — PROMETHAZINE HCL 12.5 MG PO TABS
25.0000 mg | ORAL_TABLET | ORAL | Status: AC
  Administered 2017-04-06: 25 mg via ORAL
  Filled 2017-04-06: qty 2

## 2017-04-06 MED ORDER — ONDANSETRON 4 MG PO TBDP: 4.0000 mg | ORAL_TABLET | Freq: Three times a day (TID) | ORAL | 0 refills | Status: DC | PRN

## 2017-04-06 MED ORDER — ONDANSETRON 4 MG PO TBDP
4.0000 mg | ORAL_TABLET | Freq: Once | ORAL | Status: AC
  Administered 2017-04-06: 4 mg via ORAL
  Filled 2017-04-06: qty 1

## 2017-04-06 MED ORDER — PROMETHAZINE HCL 25 MG PO TABS: 25.0000 mg | ORAL_TABLET | Freq: Four times a day (QID) | ORAL | 0 refills | Status: DC | PRN

## 2017-04-06 NOTE — ED Provider Notes (Signed)
[facility] EMERGENCY DEPARTMENT Provider Note   CSN: [id number] Arrival date & time: 04/06/17  1808     History   Chief Complaint Chief Complaint  Patient presents with  . Emesis    today    HPI Mary Hull is a 22 y.o. female.  HPI  The patient is a 22 year old female, she has a prior history of postpartum depression as well as gestational diabetes which was diet-controlled and a history of asthma.  She presents to the hospital today with a complaint of nausea which started this morning when she woke up.  The nausea is persistent, nothing seems to make it better or worse, there was one episode of vomiting, she states it was a large amount of food, she has had persistent nausea since that time but denies any further vomiting and denies any diarrhea.  She also denies any fevers chills coughing shortness of breath chest pain abdominal pain back pain swelling rashes or dysuria.  She does not recall her last menstrual cycle and states she she thinks she is regular but does not keep track of it.  She does not use any birth control and is sexually active.  She does have a 40-month-old baby at home no medications given prehospital  Past Medical History:  Diagnosis Date  . Asthma   . Gestational diabetes    diet controlled  . Postpartum depression     Patient Active Problem List   Diagnosis Date Noted  . Postpartum depression 10/11/2016  . CAP (community acquired pneumonia) 09/15/2016  . UTI (urinary tract infection) 09/15/2016  . History of gestational diabetes 06/20/2016  . Abnormal findings on diagnostic imaging of gall bladder 03/27/2016    Past Surgical History:  Procedure Laterality Date  . FRACTURE SURGERY     L elbow  . TONSILLECTOMY      OB History    Gravida Para Term Preterm AB Living   1 1 1     1    SAB TAB Ectopic Multiple Live Births         0 1       Home Medications    Prior to Admission medications   Medication Sig Start Date End Date  Taking? Authorizing Provider  albuterol (PROVENTIL HFA;VENTOLIN HFA) 108 (90 Base) MCG/ACT inhaler Inhale 1-2 puffs into the lungs every 6 (six) hours as needed for wheezing or shortness of breath. 11/06/15  Yes Vanetta Mulders, MD  albuterol (PROVENTIL) (2.5 MG/3ML) 0.083% nebulizer solution Take 3 mLs (2.5 mg total) by nebulization every 4 (four) hours as needed for wheezing or shortness of breath. 04/09/15  Yes Mancel Bale, MD  ondansetron (ZOFRAN ODT) 4 MG disintegrating tablet Take 1 tablet (4 mg total) by mouth every 8 (eight) hours as needed for nausea. 04/06/17   Eber Hong, MD  promethazine (PHENERGAN) 25 MG tablet Take 1 tablet (25 mg total) by mouth every 6 (six) hours as needed for nausea or vomiting. 04/06/17   Eber Hong, MD    Family History History reviewed. No pertinent family history.  Social History Social History   Tobacco Use  . Smoking status: Current Some Day Smoker    Years: 1.00    Types: Cigarettes  . Smokeless tobacco: Never Used  Substance Use Topics  . Alcohol use: No  . Drug use: No     Allergies   Tussionex pennkinetic er [hydrocod polst-cpm polst er]   Review of Systems Review of Systems  All other systems reviewed and are negative.  Physical Exam Updated Vital Signs BP (!) 140/98 (BP Location: Right Arm)   Pulse (S) (!) 120   Temp 98.5 F (36.9 C) (Oral)   Resp 18   Ht 5\' 2"  (1.575 m)   Wt 100.7 kg (222 lb)   LMP  (LMP Unknown)   SpO2 98%   BMI 40.60 kg/m   Physical Exam  Constitutional: She appears well-developed and well-nourished. No distress.  HENT:  Head: Normocephalic and atraumatic.  Mouth/Throat: Oropharynx is clear and moist. No oropharyngeal exudate.  OP clear and moist  Eyes: Conjunctivae and EOM are normal. Pupils are equal, round, and reactive to light. Right eye exhibits no discharge. Left eye exhibits no discharge. No scleral icterus.  No jaundice  Neck: Normal range of motion. Neck supple. No JVD present.  No thyromegaly present.  Cardiovascular: Regular rhythm, normal heart sounds and intact distal pulses. Exam reveals no gallop and no friction rub.  No murmur heard. Mild tachycardia  Pulmonary/Chest: Effort normal and breath sounds normal. No respiratory distress. She has no wheezes. She has no rales.  Abdominal: Soft. Bowel sounds are normal. She exhibits no distension and no mass. There is no tenderness.  Non tender abdomen  Musculoskeletal: Normal range of motion. She exhibits no edema or tenderness.  Lymphadenopathy:    She has no cervical adenopathy.  Neurological: She is alert. Coordination normal.  Skin: Skin is warm and dry. No rash noted. No erythema.  Psychiatric: She has a normal mood and affect. Her behavior is normal.  Nursing note and vitals reviewed.    ED Treatments / Results  Labs (all labs ordered are listed, but only abnormal results are displayed) Labs Reviewed  COMPREHENSIVE METABOLIC PANEL - Abnormal; Notable for the following components:      Result Value   Glucose, Bld 105 (*)    All other components within normal limits  CBC - Abnormal; Notable for the following components:   WBC 10.9 (*)    RBC 5.60 (*)    MCH 25.2 (*)    RDW 15.8 (*)    All other components within normal limits  URINALYSIS, ROUTINE W REFLEX MICROSCOPIC - Abnormal; Notable for the following components:   APPearance HAZY (*)    Ketones, ur 20 (*)    All other components within normal limits  PREGNANCY, URINE     Radiology No results found.  Procedures Procedures (including critical care time)  Medications Ordered in ED Medications  promethazine (PHENERGAN) tablet 25 mg (not administered)  ondansetron (ZOFRAN-ODT) disintegrating tablet 4 mg (4 mg Oral Given 04/06/17 1937)     Initial Impression / Assessment and Plan / ED Course  I have reviewed the triage vital signs and the nursing notes.  Pertinent labs & imaging results that were available during my care of the patient  were reviewed by me and considered in my medical decision making (see chart for details).     Pt is well-appearing, no signs of acute findings on exam except for mild tachycardia Vomited once, no ongoing vomiting but is still nauseated Check urine, pregnancy, basic labs, Zofran No signs of acute diagnosis on initial evaluation  Zofran didn't help Labs unremarkable Pt not pregnant - pt informed of rsults Given phenergan and Rx for zof and phen. Pt agreeable to return.  Final Clinical Impressions(s) / ED Diagnoses   Final diagnoses:  Nauseated    ED Discharge Orders        Ordered    ondansetron (ZOFRAN ODT) 4 MG disintegrating  tablet  Every 8 hours PRN     04/06/17 2151    promethazine (PHENERGAN) 25 MG tablet  Every 6 hours PRN     04/06/17 2151       Eber Hong, MD 04/06/17 2156

## 2017-04-06 NOTE — ED Triage Notes (Signed)
Pt with emesis x 1 today. Pt denies pain or any other symptoms.

## 2017-04-06 NOTE — Discharge Instructions (Signed)
See your doctor in the next 2 days if no better ER for worsening symptoms.  Zofran as needed every 4 hours for nausea If Zofran doesn't help - use Promethazine but be aware this is sedating.  Please obtain all of your results from medical records or have your doctors office obtain the results - share them with your doctor - you should be seen at your doctors office in the next 2 days. Call today to arrange your follow up. Take the medications as prescribed. Please review all of the medicines and only take them if you do not have an allergy to them. Please be aware that if you are taking birth control pills, taking other prescriptions, ESPECIALLY ANTIBIOTICS may make the birth control ineffective - if this is the case, either do not engage in sexual activity or use alternative methods of birth control such as condoms until you have finished the medicine and your family doctor says it is OK to restart them. If you are on a blood thinner such as COUMADIN, be aware that any other medicine that you take may cause the coumadin to either work too much, or not enough - you should have your coumadin level rechecked in next 7 days if this is the case.  ?  It is also a possibility that you have an allergic reaction to any of the medicines that you have been prescribed - Everybody reacts differently to medications and while MOST people have no trouble with most medicines, you may have a reaction such as nausea, vomiting, rash, swelling, shortness of breath. If this is the case, please stop taking the medicine immediately and contact your physician.  ?  You should return to the ER if you develop severe or worsening symptoms.     [facility]    Kari BaarsEdward Hawkins MD. Specialty: Pulmonary Disease Contact information: [address]  [address]  [place] [address]  [phone]   Syliva OvermanMargaret Simpson, MD. Specialty: [facility] Contact information: [address]    [place] [address]  [phone]   Lilyan PuntScott Luking, MD. Specialty: [facility] Contact information: [address]  [address]  [place] [address]  [phone]   Avon Gullyesfaye Fanta, MD Specialty: Internal Medicine Contact information: [address]  [place] [address]  [phone]   Catalina PizzaZach Hall, MD. Specialty: Internal Medicine Contact information: [address]  [place] [address]  [phone]    [facility] (Dr. Selena BattenKim) Specialty: Family Medicine Contact information: [address]  [place] [address]  [phone]   John GiovanniStephen Knowlton, MD. Specialty: [facility] Contact information: [address]  [address]  [place] [address]  [phone]   Carylon Perchesoy Fagan, MD. Specialty: Internal Medicine Contact information: [address]  [address]  [place] [address]  [phone]    [facility]  [address] [place] [address] [phone]  Services [facility] offers a variety of basic health services.  Services include but are not limited to: Blood pressure checks  Heart rate checks  Blood sugar checks  Urine analysis  Rapid strep tests  Pregnancy tests.  Health education and referrals  People needing more complex services will be directed to a physician online. Using these virtual visits, doctors can evaluate and prescribe medicine and treatments. There will be no medication on-site, though [facility] will help patients  fill their prescriptions at little to no cost.   For More information please go to: [email or web address]

## 2017-08-04 ENCOUNTER — Encounter: Payer: Self-pay | Admitting: Adult Health

## 2017-08-04 ENCOUNTER — Ambulatory Visit (INDEPENDENT_AMBULATORY_CARE_PROVIDER_SITE_OTHER): Payer: Self-pay | Admitting: Adult Health

## 2017-08-04 VITALS — BP 105/65 | HR 84 | Ht 61.0 in | Wt 221.0 lb

## 2017-08-04 DIAGNOSIS — Z8759 Personal history of other complications of pregnancy, childbirth and the puerperium: Secondary | ICD-10-CM

## 2017-08-04 DIAGNOSIS — N926 Irregular menstruation, unspecified: Secondary | ICD-10-CM

## 2017-08-04 DIAGNOSIS — Z3201 Encounter for pregnancy test, result positive: Secondary | ICD-10-CM

## 2017-08-04 DIAGNOSIS — Z8632 Personal history of gestational diabetes: Secondary | ICD-10-CM

## 2017-08-04 DIAGNOSIS — Z3A01 Less than 8 weeks gestation of pregnancy: Secondary | ICD-10-CM | POA: Insufficient documentation

## 2017-08-04 DIAGNOSIS — O3680X0 Pregnancy with inconclusive fetal viability, not applicable or unspecified: Secondary | ICD-10-CM | POA: Insufficient documentation

## 2017-08-04 LAB — POCT URINE PREGNANCY: PREG TEST UR: POSITIVE — AB

## 2017-08-04 MED ORDER — FLINTSTONES COMPLETE 60 MG PO CHEW: CHEWABLE_TABLET | ORAL | Status: DC

## 2017-08-04 NOTE — Patient Instructions (Signed)
First Trimester of Pregnancy The first trimester of pregnancy is from week 1 until the end of week 13 (months 1 through 3). A week after a sperm fertilizes an egg, the egg will implant on the wall of the uterus. This embryo will begin to develop into a baby. Genes from you and your partner will form the baby. The female genes will determine whether the baby will be a boy or a girl. At 6-8 weeks, the eyes and face will be formed, and the heartbeat can be seen on ultrasound. At the end of 12 weeks, all the baby's organs will be formed. Now that you are pregnant, you will want to do everything you can to have a healthy baby. Two of the most important things are to get good prenatal care and to follow your health care provider's instructions. Prenatal care is all the medical care you receive before the baby's birth. This care will help prevent, find, and treat any problems during the pregnancy and childbirth. Body changes during your first trimester Your body goes through many changes during pregnancy. The changes vary from woman to woman.  You may gain or lose a couple of pounds at first.  You may feel sick to your stomach (nauseous) and you may throw up (vomit). If the vomiting is uncontrollable, call your health care provider.  You may tire easily.  You may develop headaches that can be relieved by medicines. All medicines should be approved by your health care provider.  You may urinate more often. Painful urination may mean you have a bladder infection.  You may develop heartburn as a result of your pregnancy.  You may develop constipation because certain hormones are causing the muscles that push stool through your intestines to slow down.  You may develop hemorrhoids or swollen veins (varicose veins).  Your breasts may begin to grow larger and become tender. Your nipples may stick out more, and the tissue that surrounds them (areola) may become darker.  Your gums may bleed and may be  sensitive to brushing and flossing.  Dark spots or blotches (chloasma, mask of pregnancy) may develop on your face. This will likely fade after the baby is born.  Your menstrual periods will stop.  You may have a loss of appetite.  You may develop cravings for certain kinds of food.  You may have changes in your emotions from day to day, such as being excited to be pregnant or being concerned that something may go wrong with the pregnancy and baby.  You may have more vivid and strange dreams.  You may have changes in your hair. These can include thickening of your hair, rapid growth, and changes in texture. Some women also have hair loss during or after pregnancy, or hair that feels dry or thin. Your hair will most likely return to normal after your baby is born.  What to expect at prenatal visits During a routine prenatal visit:  You will be weighed to make sure you and the baby are growing normally.  Your blood pressure will be taken.  Your abdomen will be measured to track your baby's growth.  The fetal heartbeat will be listened to between weeks 10 and 14 of your pregnancy.  Test results from any previous visits will be discussed.  Your health care provider may ask you:  How you are feeling.  If you are feeling the baby move.  If you have had any abnormal symptoms, such as leaking fluid, bleeding, severe headaches,   or abdominal cramping.  If you are using any tobacco products, including cigarettes, chewing tobacco, and electronic cigarettes.  If you have any questions.  Other tests that may be performed during your first trimester include:  Blood tests to find your blood type and to check for the presence of any previous infections. The tests will also be used to check for low iron levels (anemia) and protein on red blood cells (Rh antibodies). Depending on your risk factors, or if you previously had diabetes during pregnancy, you may have tests to check for high blood  sugar that affects pregnant women (gestational diabetes).  Urine tests to check for infections, diabetes, or protein in the urine.  An ultrasound to confirm the proper growth and development of the baby.  Fetal screens for spinal cord problems (spina bifida) and Down syndrome.  HIV (human immunodeficiency virus) testing. Routine prenatal testing includes screening for HIV, unless you choose not to have this test.  You may need other tests to make sure you and the baby are doing well.  Follow these instructions at home: Medicines  Follow your health care provider's instructions regarding medicine use. Specific medicines may be either safe or unsafe to take during pregnancy.  Take a prenatal vitamin that contains at least 600 micrograms (mcg) of folic acid.  If you develop constipation, try taking a stool softener if your health care provider approves. Eating and drinking  Eat a balanced diet that includes fresh fruits and vegetables, whole grains, good sources of protein such as meat, eggs, or tofu, and low-fat dairy. Your health care provider will help you determine the amount of weight gain that is right for you.  Avoid raw meat and uncooked cheese. These carry germs that can cause birth defects in the baby.  Eating four or five small meals rather than three large meals a day may help relieve nausea and vomiting. If you start to feel nauseous, eating a few soda crackers can be helpful. Drinking liquids between meals, instead of during meals, also seems to help ease nausea and vomiting.  Limit foods that are high in fat and processed sugars, such as fried and sweet foods.  To prevent constipation: ? Eat foods that are high in fiber, such as fresh fruits and vegetables, whole grains, and beans. ? Drink enough fluid to keep your urine clear or pale yellow. Activity  Exercise only as directed by your health care provider. Most women can continue their usual exercise routine during  pregnancy. Try to exercise for 30 minutes at least 5 days a week. Exercising will help you: ? Control your weight. ? Stay in shape. ? Be prepared for labor and delivery.  Experiencing pain or cramping in the lower abdomen or lower back is a good sign that you should stop exercising. Check with your health care provider before continuing with normal exercises.  Try to avoid standing for long periods of time. Move your legs often if you must stand in one place for a long time.  Avoid heavy lifting.  Wear low-heeled shoes and practice good posture.  You may continue to have sex unless your health care provider tells you not to. Relieving pain and discomfort  Wear a good support bra to relieve breast tenderness.  Take warm sitz baths to soothe any pain or discomfort caused by hemorrhoids. Use hemorrhoid cream if your health care provider approves.  Rest with your legs elevated if you have leg cramps or low back pain.  If you develop   varicose veins in your legs, wear support hose. Elevate your feet for 15 minutes, 3-4 times a day. Limit salt in your diet. Prenatal care  Schedule your prenatal visits by the twelfth week of pregnancy. They are usually scheduled monthly at first, then more often in the last 2 months before delivery.  Write down your questions. Take them to your prenatal visits.  Keep all your prenatal visits as told by your health care provider. This is important. Safety  Wear your seat belt at all times when driving.  Make a list of emergency phone numbers, including numbers for family, friends, the hospital, and police and fire departments. General instructions  Ask your health care provider for a referral to a local prenatal education class. Begin classes no later than the beginning of month 6 of your pregnancy.  Ask for help if you have counseling or nutritional needs during pregnancy. Your health care provider can offer advice or refer you to specialists for help  with various needs.  Do not use hot tubs, steam rooms, or saunas.  Do not douche or use tampons or scented sanitary pads.  Do not cross your legs for long periods of time.  Avoid cat litter boxes and soil used by cats. These carry germs that can cause birth defects in the baby and possibly loss of the fetus by miscarriage or stillbirth.  Avoid all smoking, herbs, alcohol, and medicines not prescribed by your health care provider. Chemicals in these products affect the formation and growth of the baby.  Do not use any products that contain nicotine or tobacco, such as cigarettes and e-cigarettes. If you need help quitting, ask your health care provider. You may receive counseling support and other resources to help you quit.  Schedule a dentist appointment. At home, brush your teeth with a soft toothbrush and be gentle when you floss. Contact a health care provider if:  You have dizziness.  You have mild pelvic cramps, pelvic pressure, or nagging pain in the abdominal area.  You have persistent nausea, vomiting, or diarrhea.  You have a bad smelling vaginal discharge.  You have pain when you urinate.  You notice increased swelling in your face, hands, legs, or ankles.  You are exposed to fifth disease or chickenpox.  You are exposed to German measles (rubella) and have never had it. Get help right away if:  You have a fever.  You are leaking fluid from your vagina.  You have spotting or bleeding from your vagina.  You have severe abdominal cramping or pain.  You have rapid weight gain or loss.  You vomit blood or material that looks like coffee grounds.  You develop a severe headache.  You have shortness of breath.  You have any kind of trauma, such as from a fall or a car accident. Summary  The first trimester of pregnancy is from week 1 until the end of week 13 (months 1 through 3).  Your body goes through many changes during pregnancy. The changes vary from  woman to woman.  You will have routine prenatal visits. During those visits, your health care provider will examine you, discuss any test results you may have, and talk with you about how you are feeling. This information is not intended to replace advice given to you by your health care provider. Make sure you discuss any questions you have with your health care provider. Document Released: 01/08/2001 Document Revised: 12/27/2015 Document Reviewed: 12/27/2015 Elsevier Interactive Patient Education  2018 Elsevier   Inc.  

## 2017-08-04 NOTE — Progress Notes (Signed)
  Subjective:     Patient ID: Mary BrackettKatelynn L Hull, female   DOB: 07/03/1995, 21 y.o.   MRN: [id number]  HPI [name] is 22 year old white female in for UPT, has missed a period and had 2+HPTs. She works at [employer].She had gestational diabetes, in last pregnancy, and has 4711 month old.   Review of Systems +missed period, with 2 +HPTs +fatigue Reviewed past medical,surgical, social and family history. Reviewed medications and allergies.    Objective:   Physical Exam BP 105/65 (BP Location: Left Arm, Patient Position: Sitting, Cuff Size: Large)   Pulse 84   Ht 5\' 1"  (1.549 m)   Wt 221 lb (100.2 kg)   LMP 06/25/2017   Breastfeeding? No   BMI 41.76 kg/m UPT+, about 5+5 weeks by LMP with EDD 04/02/18.Skin warm and dry. Neck: mid line trachea, normal thyroid, good ROM, no lymphadenopathy noted. Lungs: clear to ausculation bilaterally. Cardiovascular: regular rate and rhythm.Abdomen is soft and non tender.     Assessment:     1. Pregnancy test positive   2. Less than [redacted] weeks gestation of pregnancy   3. Encounter to determine fetal viability of pregnancy, single or unspecified fetus   4. History of gestational diabetes       Plan:        Meds ordered this encounter  Medications  . flintstones complete (FLINTSTONES) 60 MG chewable tablet    Sig: Take 2 daily    Order Specific Question:   Supervising Provider    Answer:   Lazaro ArmsEURE, LUTHER H [[id number]]  Return in 1 week for dating US Review handouts on First trimester and by [facility]

## 2017-08-15 ENCOUNTER — Ambulatory Visit (INDEPENDENT_AMBULATORY_CARE_PROVIDER_SITE_OTHER): Payer: Medicaid Other

## 2017-08-15 DIAGNOSIS — O3411 Maternal care for benign tumor of corpus uteri, first trimester: Secondary | ICD-10-CM

## 2017-08-15 DIAGNOSIS — N8312 Corpus luteum cyst of left ovary: Secondary | ICD-10-CM

## 2017-08-15 DIAGNOSIS — O3680X0 Pregnancy with inconclusive fetal viability, not applicable or unspecified: Secondary | ICD-10-CM

## 2017-08-15 DIAGNOSIS — Z3A01 Less than 8 weeks gestation of pregnancy: Secondary | ICD-10-CM | POA: Diagnosis not present

## 2017-08-15 NOTE — Progress Notes (Signed)
US 7+2 wks,single IUP w/ys,positive fht 119 bpm,normal right ovary,complex left corpus luteal cyst  2.8 x 2.8 x 1.5 cm,crl 8.02 mm

## 2017-08-22 ENCOUNTER — Telehealth: Payer: Self-pay | Admitting: *Deleted

## 2017-08-22 NOTE — Telephone Encounter (Signed)
LMOVM for patient to push fluids as dehydration can cause cramping.  If she has any bleeding to call our office or [facility]

## 2017-08-28 ENCOUNTER — Ambulatory Visit (INDEPENDENT_AMBULATORY_CARE_PROVIDER_SITE_OTHER): Payer: Medicaid Other | Admitting: Advanced Practice Midwife

## 2017-08-28 ENCOUNTER — Encounter: Payer: Self-pay | Admitting: Advanced Practice Midwife

## 2017-08-28 ENCOUNTER — Other Ambulatory Visit: Payer: Self-pay

## 2017-08-28 VITALS — BP 114/67 | HR 101 | Wt 215.0 lb

## 2017-08-28 DIAGNOSIS — Z3682 Encounter for antenatal screening for nuchal translucency: Secondary | ICD-10-CM

## 2017-08-28 DIAGNOSIS — Z8632 Personal history of gestational diabetes: Secondary | ICD-10-CM | POA: Diagnosis not present

## 2017-08-28 DIAGNOSIS — Z331 Pregnant state, incidental: Secondary | ICD-10-CM

## 2017-08-28 DIAGNOSIS — Z3A09 9 weeks gestation of pregnancy: Secondary | ICD-10-CM | POA: Diagnosis not present

## 2017-08-28 DIAGNOSIS — Z3481 Encounter for supervision of other normal pregnancy, first trimester: Secondary | ICD-10-CM | POA: Diagnosis not present

## 2017-08-28 DIAGNOSIS — O0990 Supervision of high risk pregnancy, unspecified, unspecified trimester: Secondary | ICD-10-CM | POA: Insufficient documentation

## 2017-08-28 DIAGNOSIS — Z1389 Encounter for screening for other disorder: Secondary | ICD-10-CM

## 2017-08-28 LAB — POCT URINALYSIS DIPSTICK OB
Blood, UA: NEGATIVE
GLUCOSE, UA: NEGATIVE — AB
KETONES UA: NEGATIVE
Leukocytes, UA: NEGATIVE
NITRITE UA: NEGATIVE
PROTEIN: NEGATIVE

## 2017-08-28 NOTE — Patient Instructions (Signed)
 First Trimester of Pregnancy The first trimester of pregnancy is from week 1 until the end of week 12 (months 1 through 3). A week after a sperm fertilizes an egg, the egg will implant on the wall of the uterus. This embryo will begin to develop into a baby. Genes from you and your partner are forming the baby. The female genes determine whether the baby is a boy or a girl. At 6-8 weeks, the eyes and face are formed, and the heartbeat can be seen on ultrasound. At the end of 12 weeks, all the baby's organs are formed.  Now that you are pregnant, you will want to do everything you can to have a healthy baby. Two of the most important things are to get good prenatal care and to follow your health care provider's instructions. Prenatal care is all the medical care you receive before the baby's birth. This care will help prevent, find, and treat any problems during the pregnancy and childbirth. BODY CHANGES Your body goes through many changes during pregnancy. The changes vary from woman to woman.   You may gain or lose a couple of pounds at first.  You may feel sick to your stomach (nauseous) and throw up (vomit). If the vomiting is uncontrollable, call your health care provider.  You may tire easily.  You may develop headaches that can be relieved by medicines approved by your health care provider.  You may urinate more often. Painful urination may mean you have a bladder infection.  You may develop heartburn as a result of your pregnancy.  You may develop constipation because certain hormones are causing the muscles that push waste through your intestines to slow down.  You may develop hemorrhoids or swollen, bulging veins (varicose veins).  Your breasts may begin to grow larger and become tender. Your nipples may stick out more, and the tissue that surrounds them (areola) may become darker.  Your gums may bleed and may be sensitive to brushing and flossing.  Dark spots or blotches  (chloasma, mask of pregnancy) may develop on your face. This will likely fade after the baby is born.  Your menstrual periods will stop.  You may have a loss of appetite.  You may develop cravings for certain kinds of food.  You may have changes in your emotions from day to day, such as being excited to be pregnant or being concerned that something may go wrong with the pregnancy and baby.  You may have more vivid and strange dreams.  You may have changes in your hair. These can include thickening of your hair, rapid growth, and changes in texture. Some women also have hair loss during or after pregnancy, or hair that feels dry or thin. Your hair will most likely return to normal after your baby is born. WHAT TO EXPECT AT YOUR PRENATAL VISITS During a routine prenatal visit:  You will be weighed to make sure you and the baby are growing normally.  Your blood pressure will be taken.  Your abdomen will be measured to track your baby's growth.  The fetal heartbeat will be listened to starting around week 10 or 12 of your pregnancy.  Test results from any previous visits will be discussed. Your health care provider may ask you:  How you are feeling.  If you are feeling the baby move.  If you have had any abnormal symptoms, such as leaking fluid, bleeding, severe headaches, or abdominal cramping.  If you have any questions. Other   tests that may be performed during your first trimester include:  Blood tests to find your blood type and to check for the presence of any previous infections. They will also be used to check for low iron levels (anemia) and Rh antibodies. Later in the pregnancy, blood tests for diabetes will be done along with other tests if problems develop.  Urine tests to check for infections, diabetes, or protein in the urine.  An ultrasound to confirm the proper growth and development of the baby.  An amniocentesis to check for possible genetic problems.  Fetal  screens for spina bifida and Down syndrome.  You may need other tests to make sure you and the baby are doing well. HOME CARE INSTRUCTIONS  Medicines  Follow your health care provider's instructions regarding medicine use. Specific medicines may be either safe or unsafe to take during pregnancy.  Take your prenatal vitamins as directed.  If you develop constipation, try taking a stool softener if your health care provider approves. Diet  Eat regular, well-balanced meals. Choose a variety of foods, such as meat or vegetable-based protein, fish, milk and low-fat dairy products, vegetables, fruits, and whole grain breads and cereals. Your health care provider will help you determine the amount of weight gain that is right for you.  Avoid raw meat and uncooked cheese. These carry germs that can cause birth defects in the baby.  Eating four or five small meals rather than three large meals a day may help relieve nausea and vomiting. If you start to feel nauseous, eating a few soda crackers can be helpful. Drinking liquids between meals instead of during meals also seems to help nausea and vomiting.  If you develop constipation, eat more high-fiber foods, such as fresh vegetables or fruit and whole grains. Drink enough fluids to keep your urine clear or pale yellow. Activity and Exercise  Exercise only as directed by your health care provider. Exercising will help you:  Control your weight.  Stay in shape.  Be prepared for labor and delivery.  Experiencing pain or cramping in the lower abdomen or low back is a good sign that you should stop exercising. Check with your health care provider before continuing normal exercises.  Try to avoid standing for long periods of time. Move your legs often if you must stand in one place for a long time.  Avoid heavy lifting.  Wear low-heeled shoes, and practice good posture.  You may continue to have sex unless your health care provider directs you  otherwise. Relief of Pain or Discomfort  Wear a good support bra for breast tenderness.   Take warm sitz baths to soothe any pain or discomfort caused by hemorrhoids. Use hemorrhoid cream if your health care provider approves.   Rest with your legs elevated if you have leg cramps or low back pain.  If you develop varicose veins in your legs, wear support hose. Elevate your feet for 15 minutes, 3-4 times a day. Limit salt in your diet. Prenatal Care  Schedule your prenatal visits by the twelfth week of pregnancy. They are usually scheduled monthly at first, then more often in the last 2 months before delivery.  Write down your questions. Take them to your prenatal visits.  Keep all your prenatal visits as directed by your health care provider. Safety  Wear your seat belt at all times when driving.  Make a list of emergency phone numbers, including numbers for family, friends, the hospital, and police and fire departments. General   Tips  Ask your health care provider for a referral to a local prenatal education class. Begin classes no later than at the beginning of month 6 of your pregnancy.  Ask for help if you have counseling or nutritional needs during pregnancy. Your health care provider can offer advice or refer you to specialists for help with various needs.  Do not use hot tubs, steam rooms, or saunas.  Do not douche or use tampons or scented sanitary pads.  Do not cross your legs for long periods of time.  Avoid cat litter boxes and soil used by cats. These carry germs that can cause birth defects in the baby and possibly loss of the fetus by miscarriage or stillbirth.  Avoid all smoking, herbs, alcohol, and medicines not prescribed by your health care provider. Chemicals in these affect the formation and growth of the baby.  Schedule a dentist appointment. At home, brush your teeth with a soft toothbrush and be gentle when you floss. SEEK MEDICAL CARE IF:   You have  dizziness.  You have mild pelvic cramps, pelvic pressure, or nagging pain in the abdominal area.  You have persistent nausea, vomiting, or diarrhea.  You have a bad smelling vaginal discharge.  You have pain with urination.  You notice increased swelling in your face, hands, legs, or ankles. SEEK IMMEDIATE MEDICAL CARE IF:   You have a fever.  You are leaking fluid from your vagina.  You have spotting or bleeding from your vagina.  You have severe abdominal cramping or pain.  You have rapid weight gain or loss.  You vomit blood or material that looks like coffee grounds.  You are exposed to German measles and have never had them.  You are exposed to fifth disease or chickenpox.  You develop a severe headache.  You have shortness of breath.  You have any kind of trauma, such as from a fall or a car accident. Document Released: 01/08/2001 Document Revised: 05/31/2013 Document Reviewed: 11/24/2012 [employer] Patient Information 2015 [employer]. This information is not intended to replace advice given to you by your health care provider. Make sure you discuss any questions you have with your health care provider.   Nausea & Vomiting  Have saltine crackers or pretzels by your bed and eat a few bites before you raise your head out of bed in the morning  Eat small frequent meals throughout the day instead of large meals  Drink plenty of fluids throughout the day to stay hydrated, just don't drink a lot of fluids with your meals.  This can make your stomach fill up faster making you feel sick  Do not brush your teeth right after you eat  Products with real ginger are good for nausea, like ginger ale and ginger hard candy Make sure it says made with real ginger!  Sucking on sour candy like lemon heads is also good for nausea  If your prenatal vitamins make you nauseated, take them at night so you will sleep through the nausea  Sea Bands  If you feel like you need  medicine for the nausea & vomiting please let us know  If you are unable to keep any fluids or food down please let us know   Constipation  Drink plenty of fluid, preferably water, throughout the day  Eat foods high in fiber such as fruits, vegetables, and grains  Exercise, such as walking, is a good way to keep your bowels regular  Drink warm fluids, especially warm   prune juice, or decaf coffee  Eat a 1/2 cup of real oatmeal (not instant), 1/2 cup applesauce, and 1/2-1 cup warm prune juice every day  If needed, you may take Colace (docusate sodium) stool softener once or twice a day to help keep the stool soft. If you are pregnant, wait until you are out of your first trimester (12-14 weeks of pregnancy)  If you still are having problems with constipation, you may take Miralax once daily as needed to help keep your bowels regular.  If you are pregnant, wait until you are out of your first trimester (12-14 weeks of pregnancy)  Safe Medications in Pregnancy   Acne: Benzoyl Peroxide Salicylic Acid  Backache/Headache: Tylenol: 2 regular strength every 4 hours OR              2 Extra strength every 6 hours  Colds/Coughs/Allergies: Benadryl (alcohol free) 25 mg every 6 hours as needed Breath right strips Claritin Cepacol throat lozenges Chloraseptic throat spray Cold-Eeze- up to three times per day Cough drops, alcohol free Flonase (by prescription only) Guaifenesin Mucinex Robitussin DM (plain only, alcohol free) Saline nasal spray/drops Sudafed (pseudoephedrine) & Actifed ** use only after [redacted] weeks gestation and if you do not have high blood pressure Tylenol Vicks Vaporub Zinc lozenges Zyrtec   Constipation: Colace Ducolax suppositories Fleet enema Glycerin suppositories Metamucil Milk of magnesia Miralax Senokot Smooth move tea  Diarrhea: Kaopectate Imodium A-D  *NO pepto Bismol  Hemorrhoids: Anusol Anusol HC Preparation  H Tucks  Indigestion: Tums Maalox Mylanta Zantac  Pepcid  Insomnia: Benadryl (alcohol free) 25mg every 6 hours as needed Tylenol PM Unisom, no Gelcaps  Leg Cramps: Tums MagGel  Nausea/Vomiting:  Bonine Dramamine Emetrol Ginger extract Sea bands Meclizine  Nausea medication to take during pregnancy:  Unisom (doxylamine succinate 25 mg tablets) Take one tablet daily at bedtime. If symptoms are not adequately controlled, the dose can be increased to a maximum recommended dose of two tablets daily (1/2 tablet in the morning, 1/2 tablet mid-afternoon and one at bedtime). Vitamin B6 100mg tablets. Take one tablet twice a day (up to 200 mg per day).  Skin Rashes: Aveeno products Benadryl cream or 25mg every 6 hours as needed Calamine Lotion 1% cortisone cream  Yeast infection: Gyne-lotrimin 7 Monistat 7   **If taking multiple medications, please check labels to avoid duplicating the same active ingredients **take medication as directed on the label ** Do not exceed 4000 mg of tylenol in 24 hours **Do not take medications that contain aspirin or ibuprofen      

## 2017-08-28 NOTE — Progress Notes (Signed)
  Subjective:    Rhina BrackettKatelynn L Misuraca is a G2P1001 7648w1d being seen today for her first obstetrical visit.  Her obstetrical history is significant for term SVD w/o problems 8/18.  Pregnancy history fully reviewed.  Patient reports no complaints.  Vitals:   08/28/17 0842  BP: 114/67  Pulse: (!) 101  Weight: 215 lb (97.5 kg)    HISTORY: OB History  Gravida Para Term Preterm AB Living  2 1 1     1   SAB TAB Ectopic Multiple Live Births        0 1    # Outcome Date GA Lbr Len/2nd Weight Sex Delivery Anes PTL Lv  2 Current           1 Term 09/04/16 8158w1d 04:37 / 00:28 7 lb 1.9 oz (3.229 kg) M Vag-Spont EPI  LIV   Past Medical History:  Diagnosis Date  . Asthma   . Gestational diabetes    diet controlled  . Postpartum depression    Past Surgical History:  Procedure Laterality Date  . FRACTURE SURGERY     L elbow  . TONSILLECTOMY     History reviewed. No pertinent family history.   Exam                                      System:     Skin: normal coloration and turgor, no rashes    Neurologic: oriented, normal, normal mood   Extremities: normal strength, tone, and muscle mass   HEENT PERRLA   Mouth/Teeth mucous membranes moist, normal dentition   Neck supple and no masses   Cardiovascular: regular rate and rhythm   Respiratory:  appears well, vitals normal, no respiratory distress, acyanotic   Abdomen: soft, non-tender;  FHR: 160 us        The nature of [facility] with multiple MDs and other Advanced Practice Providers was explained to patient; also emphasized that residents, students are part of our team.  Assessment:    Pregnancy: G2P1001 Patient Active Problem List   Diagnosis Date Noted  . Supervision of normal pregnancy 08/28/2017  . Postpartum depression 10/11/2016  . History of gestational diabetes 06/20/2016  . Abnormal findings on diagnostic imaging of gall bladder 03/27/2016        Plan:      Initial labs drawn. Babyscripts optimization Continue prenatal vitamins  Problem list reviewed and updated  Reviewed n/v relief measures and warning s/s to report  Reviewed recommended weight gain based on pre-gravid BMI  Encouraged well-balanced diet Genetic Screening discussed Integrated Screen: requested.  Ultrasound discussed; fetal survey: requested.  Return in about 1 month (around 09/25/2017) for LROB, US:NT+1st IT.  Jacklyn ShellFrances Cresenzo-Dishmon 08/28/2017

## 2017-08-29 LAB — OBSTETRIC PANEL, INCLUDING HIV
Antibody Screen: NEGATIVE
BASOS: 0 %
Basophils Absolute: 0 10*3/uL (ref 0.0–0.2)
EOS (ABSOLUTE): 0.2 10*3/uL (ref 0.0–0.4)
EOS: 3 %
HEMATOCRIT: 41.4 % (ref 34.0–46.6)
HIV Screen 4th Generation wRfx: NONREACTIVE
Hemoglobin: 13.2 g/dL (ref 11.1–15.9)
Hepatitis B Surface Ag: NEGATIVE
Immature Grans (Abs): 0 10*3/uL (ref 0.0–0.1)
Immature Granulocytes: 0 %
Lymphocytes Absolute: 2 10*3/uL (ref 0.7–3.1)
Lymphs: 31 %
MCH: 26.3 pg — ABNORMAL LOW (ref 26.6–33.0)
MCHC: 31.9 g/dL (ref 31.5–35.7)
MCV: 83 fL (ref 79–97)
MONOCYTES: 8 %
Monocytes Absolute: 0.5 10*3/uL (ref 0.1–0.9)
NEUTROS ABS: 3.8 10*3/uL (ref 1.4–7.0)
Neutrophils: 58 %
Platelets: 304 10*3/uL (ref 150–450)
RBC: 5.01 x10E6/uL (ref 3.77–5.28)
RDW: 15.7 % — AB (ref 12.3–15.4)
RH TYPE: POSITIVE
RPR Ser Ql: NONREACTIVE
Rubella Antibodies, IGG: 25.2 index (ref >0.99)
WBC: 6.5 10*3/uL (ref 3.4–10.8)

## 2017-08-29 LAB — URINALYSIS, ROUTINE W REFLEX MICROSCOPIC
Bilirubin, UA: NEGATIVE
Glucose, UA: NEGATIVE
Ketones, UA: NEGATIVE
Leukocytes, UA: NEGATIVE
NITRITE UA: NEGATIVE
PH UA: 6.5 (ref 5.0–7.5)
PROTEIN UA: NEGATIVE
RBC, UA: NEGATIVE
Specific Gravity, UA: 1.024 (ref 1.005–1.030)
UUROB: 1 mg/dL (ref 0.2–1.0)

## 2017-08-29 LAB — HEMOGLOBIN A1C
ESTIMATED AVERAGE GLUCOSE: 108 mg/dL
HEMOGLOBIN A1C: 5.4 % (ref 4.8–5.6)

## 2017-08-30 LAB — URINE CULTURE

## 2017-08-30 LAB — GC/CHLAMYDIA PROBE AMP
CHLAMYDIA, DNA PROBE: NEGATIVE
NEISSERIA GONORRHOEAE BY PCR: NEGATIVE

## 2017-09-22 ENCOUNTER — Encounter: Payer: Self-pay | Admitting: *Deleted

## 2017-09-26 ENCOUNTER — Ambulatory Visit (INDEPENDENT_AMBULATORY_CARE_PROVIDER_SITE_OTHER): Payer: Medicaid Other | Admitting: Women's Health

## 2017-09-26 ENCOUNTER — Encounter: Payer: Self-pay | Admitting: Women's Health

## 2017-09-26 ENCOUNTER — Ambulatory Visit (INDEPENDENT_AMBULATORY_CARE_PROVIDER_SITE_OTHER): Payer: Medicaid Other

## 2017-09-26 VITALS — BP 106/68 | HR 85 | Wt 208.4 lb

## 2017-09-26 DIAGNOSIS — Z363 Encounter for antenatal screening for malformations: Secondary | ICD-10-CM

## 2017-09-26 DIAGNOSIS — Z3682 Encounter for antenatal screening for nuchal translucency: Secondary | ICD-10-CM

## 2017-09-26 DIAGNOSIS — Z3A13 13 weeks gestation of pregnancy: Secondary | ICD-10-CM

## 2017-09-26 DIAGNOSIS — Z331 Pregnant state, incidental: Secondary | ICD-10-CM

## 2017-09-26 DIAGNOSIS — Z3481 Encounter for supervision of other normal pregnancy, first trimester: Secondary | ICD-10-CM

## 2017-09-26 DIAGNOSIS — Z1389 Encounter for screening for other disorder: Secondary | ICD-10-CM

## 2017-09-26 DIAGNOSIS — Z3402 Encounter for supervision of normal first pregnancy, second trimester: Secondary | ICD-10-CM

## 2017-09-26 LAB — POCT URINALYSIS DIPSTICK OB
Blood, UA: NEGATIVE
Glucose, UA: NEGATIVE
LEUKOCYTES UA: NEGATIVE
Nitrite, UA: NEGATIVE
PROTEIN: NEGATIVE

## 2017-09-26 NOTE — Patient Instructions (Addendum)
Mary Hull, I greatly value your feedback.  If you receive a survey following your visit with [place] today, we appreciate you taking the time to fill it out.  Thanks, Knute Neu, CNM, WHNP-BC  **ACTIVATE YOUR BABYSCRIPTS TODAY!!!** LET us KNOW IF YOU HAVE ANY PROBLEMS OR DO NOT GET YOUR BLOOD PRESSURE KIT!   Second Trimester of Pregnancy The second trimester is from week 14 through week 27 (months 4 through 6). The second trimester is often a time when you feel your best. Your body has adjusted to being pregnant, and you begin to feel better physically. Usually, morning sickness has lessened or quit completely, you may have more energy, and you may have an increase in appetite. The second trimester is also a time when the fetus is growing rapidly. At the end of the sixth month, the fetus is about 9 inches long and weighs about 1 pounds. You will likely begin to feel the baby move (quickening) between 16 and 20 weeks of pregnancy. Body changes during your second trimester Your body continues to go through many changes during your second trimester. The changes vary from woman to woman.  Your weight will continue to increase. You will notice your lower abdomen bulging out.  You may begin to get stretch marks on your hips, abdomen, and breasts.  You may develop headaches that can be relieved by medicines. The medicines should be approved by your health care provider.  You may urinate more often because the fetus is pressing on your bladder.  You may develop or continue to have heartburn as a result of your pregnancy.  You may develop constipation because certain hormones are causing the muscles that push waste through your intestines to slow down.  You may develop hemorrhoids or swollen, bulging veins (varicose veins).  You may have back pain. This is caused by: ? Weight gain. ? Pregnancy hormones that are relaxing the joints in your pelvis. ? A shift in weight and the muscles that  support your balance.  Your breasts will continue to grow and they will continue to become tender.  Your gums may bleed and may be sensitive to brushing and flossing.  Dark spots or blotches (chloasma, mask of pregnancy) may develop on your face. This will likely fade after the baby is born.  A dark line from your belly button to the pubic area (linea nigra) may appear. This will likely fade after the baby is born.  You may have changes in your hair. These can include thickening of your hair, rapid growth, and changes in texture. Some women also have hair loss during or after pregnancy, or hair that feels dry or thin. Your hair will most likely return to normal after your baby is born.  What to expect at prenatal visits During a routine prenatal visit:  You will be weighed to make sure you and the fetus are growing normally.  Your blood pressure will be taken.  Your abdomen will be measured to track your baby's growth.  The fetal heartbeat will be listened to.  Any test results from the previous visit will be discussed.  Your health care provider may ask you:  How you are feeling.  If you are feeling the baby move.  If you have had any abnormal symptoms, such as leaking fluid, bleeding, severe headaches, or abdominal cramping.  If you are using any tobacco products, including cigarettes, chewing tobacco, and electronic cigarettes.  If you have any questions.  Other tests that  may be performed during your second trimester include:  Blood tests that check for: ? Low iron levels (anemia). ? High blood sugar that affects pregnant women (gestational diabetes) between 42 and 28 weeks. ? Rh antibodies. This is to check for a protein on red blood cells (Rh factor).  Urine tests to check for infections, diabetes, or protein in the urine.  An ultrasound to confirm the proper growth and development of the baby.  An amniocentesis to check for possible genetic problems.  Fetal  screens for spina bifida and Down syndrome.  HIV (human immunodeficiency virus) testing. Routine prenatal testing includes screening for HIV, unless you choose not to have this test.  Follow these instructions at home: Medicines  Follow your health care provider's instructions regarding medicine use. Specific medicines may be either safe or unsafe to take during pregnancy.  Take a prenatal vitamin that contains at least 600 micrograms (mcg) of folic acid.  If you develop constipation, try taking a stool softener if your health care provider approves. Eating and drinking  Eat a balanced diet that includes fresh fruits and vegetables, whole grains, good sources of protein such as meat, eggs, or tofu, and low-fat dairy. Your health care provider will help you determine the amount of weight gain that is right for you.  Avoid raw meat and uncooked cheese. These carry germs that can cause birth defects in the baby.  If you have low calcium intake from food, talk to your health care provider about whether you should take a daily calcium supplement.  Limit foods that are high in fat and processed sugars, such as fried and sweet foods.  To prevent constipation: ? Drink enough fluid to keep your urine clear or pale yellow. ? Eat foods that are high in fiber, such as fresh fruits and vegetables, whole grains, and beans. Activity  Exercise only as directed by your health care provider. Most women can continue their usual exercise routine during pregnancy. Try to exercise for 30 minutes at least 5 days a week. Stop exercising if you experience uterine contractions.  Avoid heavy lifting, wear low heel shoes, and practice good posture.  A sexual relationship may be continued unless your health care provider directs you otherwise. Relieving pain and discomfort  Wear a good support bra to prevent discomfort from breast tenderness.  Take warm sitz baths to soothe any pain or discomfort caused by  hemorrhoids. Use hemorrhoid cream if your health care provider approves.  Rest with your legs elevated if you have leg cramps or low back pain.  If you develop varicose veins, wear support hose. Elevate your feet for 15 minutes, 3-4 times a day. Limit salt in your diet. Prenatal Care  Write down your questions. Take them to your prenatal visits.  Keep all your prenatal visits as told by your health care provider. This is important. Safety  Wear your seat belt at all times when driving.  Make a list of emergency phone numbers, including numbers for family, friends, the hospital, and police and fire departments. General instructions  Ask your health care provider for a referral to a local prenatal education class. Begin classes no later than the beginning of month 6 of your pregnancy.  Ask for help if you have counseling or nutritional needs during pregnancy. Your health care provider can offer advice or refer you to specialists for help with various needs.  Do not use hot tubs, steam rooms, or saunas.  Do not douche or use tampons or  scented sanitary pads.  Do not cross your legs for long periods of time.  Avoid cat litter boxes and soil used by cats. These carry germs that can cause birth defects in the baby and possibly loss of the fetus by miscarriage or stillbirth.  Avoid all smoking, herbs, alcohol, and unprescribed drugs. Chemicals in these products can affect the formation and growth of the baby.  Do not use any products that contain nicotine or tobacco, such as cigarettes and e-cigarettes. If you need help quitting, ask your health care provider.  Visit your dentist if you have not gone yet during your pregnancy. Use a soft toothbrush to brush your teeth and be gentle when you floss. Contact a health care provider if:  You have dizziness.  You have mild pelvic cramps, pelvic pressure, or nagging pain in the abdominal area.  You have persistent nausea, vomiting, or  diarrhea.  You have a bad smelling vaginal discharge.  You have pain when you urinate. Get help right away if:  You have a fever.  You are leaking fluid from your vagina.  You have spotting or bleeding from your vagina.  You have severe abdominal cramping or pain.  You have rapid weight gain or weight loss.  You have shortness of breath with chest pain.  You notice sudden or extreme swelling of your face, hands, ankles, feet, or legs.  You have not felt your baby move in over an hour.  You have severe headaches that do not go away when you take medicine.  You have vision changes. Summary  The second trimester is from week 14 through week 27 (months 4 through 6). It is also a time when the fetus is growing rapidly.  Your body goes through many changes during pregnancy. The changes vary from woman to woman.  Avoid all smoking, herbs, alcohol, and unprescribed drugs. These chemicals affect the formation and growth your baby.  Do not use any tobacco products, such as cigarettes, chewing tobacco, and e-cigarettes. If you need help quitting, ask your health care provider.  Contact your health care provider if you have any questions. Keep all prenatal visits as told by your health care provider. This is important. This information is not intended to replace advice given to you by your health care provider. Make sure you discuss any questions you have with your health care provider. Document Released: 01/08/2001 Document Revised: 06/22/2015 Document Reviewed: 03/17/2012 Elsevier Interactive Patient Education  2017 [employer].

## 2017-09-26 NOTE — Progress Notes (Signed)
   LOW-RISK PREGNANCY VISIT Patient name: Mary Hull MRN [id number]  Date of birth: Mar 08, 1995 Chief Complaint:   Routine Prenatal Visit (1st IT)  History of Present Illness:   Mary Hull is a 22 y.o. G20P1001 female at 53w2dwith an Estimated Date of Delivery: 04/01/18 being seen today for ongoing management of a low-risk pregnancy.  Today she reports low back pain. Signed up for [employer] optimized, however she has not activated account/gotten bp kit yet.  . Vag. Bleeding: None.  Movement: Absent. denies leaking of fluid. Review of Systems:   Pertinent items are noted in HPI Denies abnormal vaginal discharge w/ itching/odor/irritation, headaches, visual changes, shortness of breath, chest pain, abdominal pain, severe nausea/vomiting, or problems with urination or bowel movements unless otherwise stated above. Pertinent History Reviewed:  Reviewed past medical,surgical, social, obstetrical and family history.  Reviewed problem list, medications and allergies. Physical Assessment:   Vitals:   09/26/17 0935  BP: 106/68  Pulse: 85  Weight: 208 lb 6.4 oz (94.5 kg)  Body mass index is 39.38 kg/m.        Physical Examination:   General appearance: Well appearing, and in no distress  Mental status: Alert, oriented to person, place, and time  Skin: Warm & dry  Cardiovascular: Normal heart rate noted  Respiratory: Normal respiratory effort, no distress  Abdomen: Soft, gravid, nontender  Pelvic: Cervical exam deferred         Extremities: Edema: None  Fetal Status: Fetal Heart Rate (bpm): 176 u/s   Movement: Absent    [place]13+2 wks,measurements c/w dates,normal ovaries bilat,crl 69.56 mm,anterior pl gr 0,fhr 176 bpm,NB present,NT 1.3 mm  Results for orders placed or performed in visit on 09/26/17 (from the past 24 hour(s))  POC Urinalysis Dipstick OB   Collection Time: 09/26/17  8:56 AM  Result Value Ref Range   Color, UA     Clarity, UA     Glucose, UA Negative  Negative   Bilirubin, UA     Ketones, UA 3+    Spec Grav, UA     Blood, UA neg    pH, UA     POC Protein UA Negative Negative, Trace   Urobilinogen, UA     Nitrite, UA neg    Leukocytes, UA Negative Negative   Appearance     Odor      Assessment & Plan:  1) Low-risk pregnancy G2P1001 at 117w2dith an Estimated Date of Delivery: 04/01/18   2) Low back pain, discussed relief measures  3) BabyScripts Optimized Pt> is not checking BP's weekly as directed-to activate account today- let usKoreanow if any issues. Will monitor for compliance. Next appt at 18wks as long as she starts check bp's weekly   Meds: No orders of the defined types were placed in this encounter.  Labs/procedures today: 1st IT/NT  Plan:  Continue routine obstetrical care   Reviewed: Preterm labor symptoms and general obstetric precautions including but not limited to vaginal bleeding, contractions, leaking of fluid and fetal movement were reviewed in detail with the patient.  All questions were answered  Follow-up: Return in about 5 weeks (around 10/31/2017) for LROB, USNT:ZGYFVCB Orders Placed This Encounter  Procedures  . [place] Comp + 14 Wk  . Integrated 1  . POC Urinalysis Dipstick OB   KiRoma SchanzNM, [facility]/30/2019 10:06 AM

## 2017-09-26 NOTE — Progress Notes (Signed)
US 13+2 wks,measurements c/w dates,normal ovaries bilat,crl 69.56 mm,anterior pl gr 0,fhr 176 bpm,NB present,NT 1.3 mm

## 2017-10-01 LAB — INTEGRATED 1
Crown Rump Length: 68.4 mm
GEST. AGE ON COLLECTION DATE: 12.9 wk
MATERNAL AGE AT EDD: 21.3 a
NUCHAL TRANSLUCENCY (NT): 1.3 mm
Number of Fetuses: 1
PAPP-A VALUE: 1341.6 ng/mL
Weight: 208 [lb_av]

## 2017-10-08 ENCOUNTER — Encounter: Payer: Self-pay | Admitting: *Deleted

## 2017-10-20 ENCOUNTER — Encounter: Payer: Self-pay | Admitting: *Deleted

## 2017-10-31 ENCOUNTER — Ambulatory Visit (INDEPENDENT_AMBULATORY_CARE_PROVIDER_SITE_OTHER): Payer: Medicaid Other | Admitting: Women's Health

## 2017-10-31 ENCOUNTER — Encounter: Payer: Self-pay | Admitting: Women's Health

## 2017-10-31 ENCOUNTER — Ambulatory Visit (INDEPENDENT_AMBULATORY_CARE_PROVIDER_SITE_OTHER): Payer: Medicaid Other

## 2017-10-31 VITALS — BP 106/66 | HR 79 | Wt 203.5 lb

## 2017-10-31 DIAGNOSIS — Z3482 Encounter for supervision of other normal pregnancy, second trimester: Secondary | ICD-10-CM

## 2017-10-31 DIAGNOSIS — Z363 Encounter for antenatal screening for malformations: Secondary | ICD-10-CM

## 2017-10-31 DIAGNOSIS — Z23 Encounter for immunization: Secondary | ICD-10-CM

## 2017-10-31 DIAGNOSIS — Z362 Encounter for other antenatal screening follow-up: Secondary | ICD-10-CM

## 2017-10-31 DIAGNOSIS — Z1389 Encounter for screening for other disorder: Secondary | ICD-10-CM

## 2017-10-31 DIAGNOSIS — Z3481 Encounter for supervision of other normal pregnancy, first trimester: Secondary | ICD-10-CM

## 2017-10-31 DIAGNOSIS — Z1379 Encounter for other screening for genetic and chromosomal anomalies: Secondary | ICD-10-CM

## 2017-10-31 DIAGNOSIS — Z331 Pregnant state, incidental: Secondary | ICD-10-CM

## 2017-10-31 DIAGNOSIS — Z3A18 18 weeks gestation of pregnancy: Secondary | ICD-10-CM

## 2017-10-31 LAB — POCT URINALYSIS DIPSTICK OB
Blood, UA: NEGATIVE
GLUCOSE, UA: NEGATIVE
Nitrite, UA: NEGATIVE
POC,PROTEIN,UA: NEGATIVE

## 2017-10-31 NOTE — Progress Notes (Signed)
   LOW-RISK PREGNANCY VISIT Patient name: Mary Hull MRN [id number]  Date of birth: [birth year] Chief Complaint:   Routine Prenatal Visit ([place] today; 2nd IT)  History of Present Illness:   Mary Hull is a 22 y.o. G10P1001 female at [redacted]w[redacted]d with an Estimated Date of Delivery: 04/01/18 being seen today for ongoing management of a low-risk pregnancy.  Today she reports no complaints. Checking bp's but not logged on w/ BS, removed from program today. Contractions: Not present. Vag. Bleeding: None.  Movement: Present. denies leaking of fluid. Review of Systems:   Pertinent items are noted in HPI Denies abnormal vaginal discharge w/ itching/odor/irritation, headaches, visual changes, shortness of breath, chest pain, abdominal pain, severe nausea/vomiting, or problems with urination or bowel movements unless otherwise stated above. Pertinent History Reviewed:  Reviewed past medical,surgical, social, obstetrical and family history.  Reviewed problem list, medications and allergies. Physical Assessment:   Vitals:   10/31/17 1011  BP: 106/66  Pulse: 79  Weight: 203 lb 8 oz (92.3 kg)  Body mass index is 38.45 kg/m.        Physical Examination:   General appearance: Well appearing, and in no distress  Mental status: Alert, oriented to person, place, and time  Skin: Warm & dry  Cardiovascular: Normal heart rate noted  Respiratory: Normal respiratory effort, no distress  Abdomen: Soft, gravid, nontender  Pelvic: Cervical exam deferred         Extremities: Edema: None  Fetal Status: Fetal Heart Rate (bpm): 137 u/s   Movement: Present    [place] 18+2 wks,breech,anterior placenta gr 0,cx 3.1 cm,svp of fluid 4.5 cm,normal ovaries bilat,fhr 137 bpm,EFW 214 g 23%,limited view of heart because of pt body habitus,please have pt come back for additional images,no obvious abnormalities   Results for orders placed or performed in visit on 10/31/17 (from the past 24 hour(s))  POC Urinalysis Dipstick  OB   Collection Time: 10/31/17 10:06 AM  Result Value Ref Range   Color, UA     Clarity, UA     Glucose, UA Negative Negative   Bilirubin, UA     Ketones, UA 3+    Spec Grav, UA     Blood, UA neg    pH, UA     POC Protein UA Negative Negative, Trace   Urobilinogen, UA     Nitrite, UA neg    Leukocytes, UA Trace (A) Negative   Appearance     Odor      Assessment & Plan:  1) Low-risk pregnancy G2P1001 at [redacted]w[redacted]d with an Estimated Date of Delivery: 04/01/18   2) Limited views fetal heart, repeat u/s ordered   Meds: No orders of the defined types were placed in this encounter.  Labs/procedures today: anatomy u/s, 2nd IT, flu shot  Plan:  Continue routine obstetrical care   Reviewed: Preterm labor symptoms and general obstetric precautions including but not limited to vaginal bleeding, contractions, leaking of fluid and fetal movement were reviewed in detail with the patient.  All questions were answered  Follow-up: Return in about 4 weeks (around 11/28/2017) for LROB, US:OB F/U heart.  Orders Placed This Encounter  Procedures  . US OB Follow Up  . Flu Vaccine QUAD 36+ mos IM (Fluarix, Quad PF)  . INTEGRATED 2  . POC Urinalysis Dipstick OB   Cheral Marker CNM, [facility] 10/31/2017 10:42 AM

## 2017-10-31 NOTE — Patient Instructions (Signed)
Mary Hull, I greatly value your feedback.  If you receive a survey following your visit with [place] today, we appreciate you taking the time to fill it out.  Thanks, [name], CNM, WHNP-BC   Second Trimester of Pregnancy The second trimester is from week 14 through week 27 (months 4 through 6). The second trimester is often a time when you feel your best. Your body has adjusted to being pregnant, and you begin to feel better physically. Usually, morning sickness has lessened or quit completely, you may have more energy, and you may have an increase in appetite. The second trimester is also a time when the fetus is growing rapidly. At the end of the sixth month, the fetus is about 9 inches long and weighs about 1 pounds. You will likely begin to feel the baby move (quickening) between 16 and 20 weeks of pregnancy. Body changes during your second trimester Your body continues to go through many changes during your second trimester. The changes vary from woman to woman.  Your weight will continue to increase. You will notice your lower abdomen bulging out.  You may begin to get stretch marks on your hips, abdomen, and breasts.  You may develop headaches that can be relieved by medicines. The medicines should be approved by your health care provider.  You may urinate more often because the fetus is pressing on your bladder.  You may develop or continue to have heartburn as a result of your pregnancy.  You may develop constipation because certain hormones are causing the muscles that push waste through your intestines to slow down.  You may develop hemorrhoids or swollen, bulging veins (varicose veins).  You may have back pain. This is caused by: ? Weight gain. ? Pregnancy hormones that are relaxing the joints in your pelvis. ? A shift in weight and the muscles that support your balance.  Your breasts will continue to grow and they will continue to become tender.  Your gums may  bleed and may be sensitive to brushing and flossing.  Dark spots or blotches (chloasma, mask of pregnancy) may develop on your face. This will likely fade after the baby is born.  A dark line from your belly button to the pubic area (linea nigra) may appear. This will likely fade after the baby is born.  You may have changes in your hair. These can include thickening of your hair, rapid growth, and changes in texture. Some women also have hair loss during or after pregnancy, or hair that feels dry or thin. Your hair will most likely return to normal after your baby is born.  What to expect at prenatal visits During a routine prenatal visit:  You will be weighed to make sure you and the fetus are growing normally.  Your blood pressure will be taken.  Your abdomen will be measured to track your baby's growth.  The fetal heartbeat will be listened to.  Any test results from the previous visit will be discussed.  Your health care provider may ask you:  How you are feeling.  If you are feeling the baby move.  If you have had any abnormal symptoms, such as leaking fluid, bleeding, severe headaches, or abdominal cramping.  If you are using any tobacco products, including cigarettes, chewing tobacco, and electronic cigarettes.  If you have any questions.  Other tests that may be performed during your second trimester include:  Blood tests that check for: ? Low iron levels (anemia). ? High  blood sugar that affects pregnant women (gestational diabetes) between 42 and 28 weeks. ? Rh antibodies. This is to check for a protein on red blood cells (Rh factor).  Urine tests to check for infections, diabetes, or protein in the urine.  An ultrasound to confirm the proper growth and development of the baby.  An amniocentesis to check for possible genetic problems.  Fetal screens for spina bifida and Down syndrome.  HIV (human immunodeficiency virus) testing. Routine prenatal testing  includes screening for HIV, unless you choose not to have this test.  Follow these instructions at home: Medicines  Follow your health care provider's instructions regarding medicine use. Specific medicines may be either safe or unsafe to take during pregnancy.  Take a prenatal vitamin that contains at least 600 micrograms (mcg) of folic acid.  If you develop constipation, try taking a stool softener if your health care provider approves. Eating and drinking  Eat a balanced diet that includes fresh fruits and vegetables, whole grains, good sources of protein such as meat, eggs, or tofu, and low-fat dairy. Your health care provider will help you determine the amount of weight gain that is right for you.  Avoid raw meat and uncooked cheese. These carry germs that can cause birth defects in the baby.  If you have low calcium intake from food, talk to your health care provider about whether you should take a daily calcium supplement.  Limit foods that are high in fat and processed sugars, such as fried and sweet foods.  To prevent constipation: ? Drink enough fluid to keep your urine clear or pale yellow. ? Eat foods that are high in fiber, such as fresh fruits and vegetables, whole grains, and beans. Activity  Exercise only as directed by your health care provider. Most women can continue their usual exercise routine during pregnancy. Try to exercise for 30 minutes at least 5 days a week. Stop exercising if you experience uterine contractions.  Avoid heavy lifting, wear low heel shoes, and practice good posture.  A sexual relationship may be continued unless your health care provider directs you otherwise. Relieving pain and discomfort  Wear a good support bra to prevent discomfort from breast tenderness.  Take warm sitz baths to soothe any pain or discomfort caused by hemorrhoids. Use hemorrhoid cream if your health care provider approves.  Rest with your legs elevated if you have  leg cramps or low back pain.  If you develop varicose veins, wear support hose. Elevate your feet for 15 minutes, 3-4 times a day. Limit salt in your diet. Prenatal Care  Write down your questions. Take them to your prenatal visits.  Keep all your prenatal visits as told by your health care provider. This is important. Safety  Wear your seat belt at all times when driving.  Make a list of emergency phone numbers, including numbers for family, friends, the hospital, and police and fire departments. General instructions  Ask your health care provider for a referral to a local prenatal education class. Begin classes no later than the beginning of month 6 of your pregnancy.  Ask for help if you have counseling or nutritional needs during pregnancy. Your health care provider can offer advice or refer you to specialists for help with various needs.  Do not use hot tubs, steam rooms, or saunas.  Do not douche or use tampons or scented sanitary pads.  Do not cross your legs for long periods of time.  Avoid cat litter boxes and soil  used by cats. These carry germs that can cause birth defects in the baby and possibly loss of the fetus by miscarriage or stillbirth.  Avoid all smoking, herbs, alcohol, and unprescribed drugs. Chemicals in these products can affect the formation and growth of the baby.  Do not use any products that contain nicotine or tobacco, such as cigarettes and e-cigarettes. If you need help quitting, ask your health care provider.  Visit your dentist if you have not gone yet during your pregnancy. Use a soft toothbrush to brush your teeth and be gentle when you floss. Contact a health care provider if:  You have dizziness.  You have mild pelvic cramps, pelvic pressure, or nagging pain in the abdominal area.  You have persistent nausea, vomiting, or diarrhea.  You have a bad smelling vaginal discharge.  You have pain when you urinate. Get help right away if:  You  have a fever.  You are leaking fluid from your vagina.  You have spotting or bleeding from your vagina.  You have severe abdominal cramping or pain.  You have rapid weight gain or weight loss.  You have shortness of breath with chest pain.  You notice sudden or extreme swelling of your face, hands, ankles, feet, or legs.  You have not felt your baby move in over an hour.  You have severe headaches that do not go away when you take medicine.  You have vision changes. Summary  The second trimester is from week 14 through week 27 (months 4 through 6). It is also a time when the fetus is growing rapidly.  Your body goes through many changes during pregnancy. The changes vary from woman to woman.  Avoid all smoking, herbs, alcohol, and unprescribed drugs. These chemicals affect the formation and growth your baby.  Do not use any tobacco products, such as cigarettes, chewing tobacco, and e-cigarettes. If you need help quitting, ask your health care provider.  Contact your health care provider if you have any questions. Keep all prenatal visits as told by your health care provider. This is important. This information is not intended to replace advice given to you by your health care provider. Make sure you discuss any questions you have with your health care provider. Document Released: 01/08/2001 Document Revised: 06/22/2015 Document Reviewed: 03/17/2012 Elsevier Interactive Patient Education  2017 [employer].

## 2017-10-31 NOTE — Progress Notes (Signed)
[place] 18+2 wks,breech,anterior placenta gr 0,cx 3.1 cm,svp of fluid 4.5 cm,normal ovaries bilat,fhr 137 bpm,EFW 214 g 23%,limited view of heart because of pt body habitus,please have pt come back for additional images,no obvious abnormalities

## 2017-11-04 LAB — INTEGRATED 2
ADSF: 1.06
AFP MOM: 0.73
Alpha-Fetoprotein: 22.7 ng/mL
Crown Rump Length: 68.4 mm
DIA MOM: 1.45
DIA Value: 208.5 pg/mL
Estriol, Unconjugated: 1.19 ng/mL
Gest. Age on Collection Date: 12.9 weeks
Gestational Age: 17.9 weeks
Maternal Age at EDD: 21.3 yr
Nuchal Translucency (NT): 1.3 mm
Nuchal Translucency MoM: 0.84
Number of Fetuses: 1
PAPP-A MoM: 1.9
PAPP-A VALUE: 1341.6 ng/mL
Test Results:: NEGATIVE
WEIGHT: 204 [lb_av]
Weight: 208 [lb_av]
hCG MoM: 2.25
hCG Value: 48.1 IU/mL

## 2017-11-17 ENCOUNTER — Encounter (HOSPITAL_COMMUNITY): Payer: Self-pay

## 2017-11-17 ENCOUNTER — Emergency Department (HOSPITAL_COMMUNITY)
Admission: EM | Admit: 2017-11-17 | Discharge: 2017-11-17 | Disposition: A | Discharge status: Home / Self Care | Payer: Medicaid Other | Attending: Emergency Medicine | Admitting: Emergency Medicine

## 2017-11-17 ENCOUNTER — Other Ambulatory Visit: Payer: Self-pay

## 2017-11-17 DIAGNOSIS — Z79899 Other long term (current) drug therapy: Secondary | ICD-10-CM | POA: Insufficient documentation

## 2017-11-17 DIAGNOSIS — J029 Acute pharyngitis, unspecified: Secondary | ICD-10-CM | POA: Diagnosis present

## 2017-11-17 DIAGNOSIS — J45909 Unspecified asthma, uncomplicated: Secondary | ICD-10-CM | POA: Insufficient documentation

## 2017-11-17 DIAGNOSIS — Z87891 Personal history of nicotine dependence: Secondary | ICD-10-CM | POA: Diagnosis not present

## 2017-11-17 LAB — GROUP A STREP BY PCR: GROUP A STREP BY PCR: NOT DETECTED

## 2017-11-17 NOTE — ED Provider Notes (Signed)
[facility] EMERGENCY DEPARTMENT Provider Note   CSN: [id number] Arrival date & time: 11/17/17  0908     History   Chief Complaint Chief Complaint  Patient presents with  . Sore Throat    HPI Mary Hull is a 22 y.o. female.  The history is provided by the patient.  Sore Throat  This is a new problem. The current episode started 2 days ago. The problem occurs constantly. The problem has been gradually worsening. Pertinent negatives include no chest pain, no abdominal pain and no shortness of breath. Associated symptoms comments: fever. The symptoms are aggravated by swallowing. Nothing relieves the symptoms. She has tried acetaminophen for the symptoms. The treatment provided no relief.    Past Medical History:  Diagnosis Date  . Asthma   . Gestational diabetes    diet controlled  . Postpartum depression     Patient Active Problem List   Diagnosis Date Noted  . Supervision of normal pregnancy 08/28/2017  . Postpartum depression 10/11/2016  . History of gestational diabetes 06/20/2016  . Abnormal findings on diagnostic imaging of gall bladder 03/27/2016    Past Surgical History:  Procedure Laterality Date  . FRACTURE SURGERY     L elbow  . TONSILLECTOMY       OB History    Gravida  2   Para  1   Term  1   Preterm      AB      Living  1     SAB      TAB      Ectopic      Multiple  0   Live Births  1            Home Medications    Prior to Admission medications   Medication Sig Start Date End Date Taking? Authorizing Provider  acetaminophen (TYLENOL) 500 MG tablet Take 500-1,000 mg by mouth as needed.    [provider]  Prenatal Vit-Fe Fumarate-FA (PRENATAL VITAMIN PO) Take by mouth daily.    [provider]    Family History No family history on file.  Social History Social History   Tobacco Use  . Smoking status: Former Smoker    Years: 1.00    Types: Cigarettes  . Smokeless tobacco: Never Used    Substance Use Topics  . Alcohol use: No  . Drug use: No     Allergies   Tussionex pennkinetic er [hydrocod polst-cpm polst er]   Review of Systems Review of Systems  Constitutional: Positive for fever. Negative for activity change.       All ROS Neg except as noted in HPI  HENT: Positive for congestion and sore throat. Negative for nosebleeds.   Eyes: Negative for photophobia and discharge.  Respiratory: Negative for cough, shortness of breath and wheezing.   Cardiovascular: Negative for chest pain and palpitations.  Gastrointestinal: Negative for abdominal pain and blood in stool.  Genitourinary: Negative for dysuria, frequency and hematuria.  Musculoskeletal: Positive for myalgias. Negative for arthralgias, back pain and neck pain.  Skin: Negative.   Neurological: Negative for dizziness, seizures and speech difficulty.  Psychiatric/Behavioral: Negative for confusion and hallucinations.     Physical Exam Updated Vital Signs BP 119/73 (BP Location: Right Arm)   Pulse 99   Temp 98 F (36.7 C) (Oral)   Resp 20   Ht 5\' 1"  (1.549 m)   Wt 92.5 kg   LMP 06/25/2017   SpO2 99%   BMI 38.55 kg/m  Physical Exam  Constitutional: She is oriented to person, place, and time. She appears well-developed and well-nourished.  Non-toxic appearance.  HENT:  Head: Normocephalic.  Right Ear: Tympanic membrane and external ear normal.  Left Ear: Tympanic membrane and external ear normal.  Nasal congestion present.  There is increased redness of the posterior pharynx and the uvula.  The uvula is in the midline.  There is a geographic tongue noted.  Eyes: Pupils are equal, round, and reactive to light. EOM and lids are normal.  Neck: Normal range of motion. Neck supple. Carotid bruit is not present.  Cardiovascular: Normal rate, regular rhythm, normal heart sounds, intact distal pulses and normal pulses.  Pulmonary/Chest: Breath sounds normal. No respiratory distress.  Abdominal: Soft.  Bowel sounds are normal. There is no tenderness. There is no guarding.  Musculoskeletal: Normal range of motion.  Lymphadenopathy:       Head (right side): No submandibular adenopathy present.       Head (left side): No submandibular adenopathy present.    She has no cervical adenopathy.  Neurological: She is alert and oriented to person, place, and time. She has normal strength. No cranial nerve deficit or sensory deficit.  Skin: Skin is warm and dry.  Psychiatric: She has a normal mood and affect. Her speech is normal.  Nursing note and vitals reviewed.    ED Treatments / Results  Labs (all labs ordered are listed, but only abnormal results are displayed) Labs Reviewed  GROUP A STREP BY PCR    EKG None  Radiology No results found.  Procedures Procedures (including critical care time)  Medications Ordered in ED Medications - No data to display   Initial Impression / Assessment and Plan / ED Course  I have reviewed the triage vital signs and the nursing notes.  Pertinent labs & imaging results that were available during my care of the patient were reviewed by me and considered in my medical decision making (see chart for details).       Final Clinical Impressions(s) / ED Diagnoses MDM  Vital signs reviewed.  Airway is patent.  Speech is understandable.  Patient is able to mobilize secretions without problem. No evidence of tonsillar abscess.  Strep test is negative.  Examination favors viral sore throat.  Patient advised of the findings on examination and findings on the lab test.  Patient will use salt water gargles, Chloraseptic spray.  Tylenol every 4 hours and/or ibuprofen every 6 hours for pain and fever.  Patient to follow-up with her primary physician or return to the emergency department if any changes in condition, problems, or concerns.   Final diagnoses:  Viral pharyngitis    ED Discharge Orders    None       Ivery Quale, Cordelia Poche 11/18/17  2047    Bethann Berkshire, MD 11/18/17 2117

## 2017-11-17 NOTE — Discharge Instructions (Addendum)
Your vital signs are within normal limits.  Your strep screen is negative.  Please wash hands frequently.  Please increase fluids.  Salt water gargles and Chloraseptic Spray may be helpful.  Tylenol every 4 hours for pain or soreness will also be helpful.  Please do not allow anyone to share your eating utensils, keep your distance from others until this has resolved.

## 2017-11-17 NOTE — ED Triage Notes (Signed)
Pt c/o sore throat and nasal congestion for past few days.  Denies cough.  Reports low grade fever.

## 2017-11-19 ENCOUNTER — Emergency Department (HOSPITAL_COMMUNITY)
Admission: EM | Admit: 2017-11-19 | Discharge: 2017-11-20 | Disposition: A | Discharge status: Home / Self Care | Payer: Medicaid Other | Attending: Emergency Medicine | Admitting: Emergency Medicine

## 2017-11-19 ENCOUNTER — Encounter (HOSPITAL_COMMUNITY): Payer: Self-pay | Admitting: Emergency Medicine

## 2017-11-19 ENCOUNTER — Emergency Department (HOSPITAL_COMMUNITY): Payer: Medicaid Other

## 2017-11-19 ENCOUNTER — Other Ambulatory Visit: Payer: Self-pay

## 2017-11-19 DIAGNOSIS — J069 Acute upper respiratory infection, unspecified: Secondary | ICD-10-CM | POA: Diagnosis not present

## 2017-11-19 DIAGNOSIS — Z87891 Personal history of nicotine dependence: Secondary | ICD-10-CM | POA: Diagnosis not present

## 2017-11-19 DIAGNOSIS — Z3A21 21 weeks gestation of pregnancy: Secondary | ICD-10-CM | POA: Insufficient documentation

## 2017-11-19 DIAGNOSIS — J9801 Acute bronchospasm: Secondary | ICD-10-CM

## 2017-11-19 DIAGNOSIS — O99512 Diseases of the respiratory system complicating pregnancy, second trimester: Secondary | ICD-10-CM | POA: Insufficient documentation

## 2017-11-19 MED ORDER — IPRATROPIUM-ALBUTEROL 0.5-2.5 (3) MG/3ML IN SOLN
3.0000 mL | Freq: Once | RESPIRATORY_TRACT | Status: AC
  Administered 2017-11-19: 3 mL via RESPIRATORY_TRACT
  Filled 2017-11-19: qty 3

## 2017-11-19 NOTE — ED Triage Notes (Signed)
Patient c/o asthma onset "just a few minutes ago." No wheezing heard during Triage. Used inhaler 30 minutes ago.

## 2017-11-19 NOTE — ED Provider Notes (Signed)
[facility] EMERGENCY DEPARTMENT Provider Note   CSN: [id number] Arrival date & time: 11/19/17  2225     History   Chief Complaint Chief Complaint  Patient presents with  . Asthma    HPI Mary Hull is a 22 y.o. female.  Patient presents with shortness of breath and wheezing worsening over the past day.  She is G2, P1 at [redacted] weeks pregnant.  Denies any problems with this pregnancy.  Denies any vaginal bleeding, abdominal pain.  She was seen in the ED 2 days ago for sore throat and was told she had viral pharyngitis.  She returns with worsening rhinorrhea, congestion, wheezing, coughing and difficulty breathing.  Denies chest pain.  She does have a history of asthma and used her inhaler 3 times today.  Good p.o. intake and urine output.  No nausea, vomiting or diarrhea.  No sick contacts at home.  She did receive a flu shot.  Denies chest pain or pain with breathing.  The history is provided by the patient.  Asthma  Associated symptoms include shortness of breath. Pertinent negatives include no chest pain, no abdominal pain and no headaches.    Past Medical History:  Diagnosis Date  . Asthma   . Gestational diabetes    diet controlled  . Postpartum depression     Patient Active Problem List   Diagnosis Date Noted  . Supervision of normal pregnancy 08/28/2017  . Postpartum depression 10/11/2016  . History of gestational diabetes 06/20/2016  . Abnormal findings on diagnostic imaging of gall bladder 03/27/2016    Past Surgical History:  Procedure Laterality Date  . FRACTURE SURGERY     L elbow  . TONSILLECTOMY       OB History    Gravida  2   Para  1   Term  1   Preterm      AB      Living  1     SAB      TAB      Ectopic      Multiple  0   Live Births  1            Home Medications    Prior to Admission medications   Medication Sig Start Date End Date Taking? Authorizing Provider  acetaminophen (TYLENOL) 500 MG tablet Take  500-1,000 mg by mouth as needed for mild pain.    Yes [provider]  Prenatal Vit-Fe Fumarate-FA (PRENATAL VITAMIN PO) Take by mouth daily.   Yes [provider]    Family History History reviewed. No pertinent family history.  Social History Social History   Tobacco Use  . Smoking status: Former Smoker    Years: 1.00    Types: Cigarettes  . Smokeless tobacco: Never Used  Substance Use Topics  . Alcohol use: No  . Drug use: No     Allergies   Tussionex pennkinetic er [hydrocod polst-cpm polst er]   Review of Systems Review of Systems  Constitutional: Negative for activity change, appetite change and fever.  HENT: Positive for congestion, rhinorrhea and sore throat.   Eyes: Negative for visual disturbance.  Respiratory: Positive for cough, shortness of breath and wheezing.   Cardiovascular: Negative for chest pain.  Gastrointestinal: Negative for abdominal pain, nausea and vomiting.  Genitourinary: Negative for dysuria, flank pain, hematuria, vaginal bleeding and vaginal discharge.  Musculoskeletal: Negative for arthralgias and myalgias.  Skin: Negative for wound.  Neurological: Negative for dizziness, weakness and headaches.  Hematological: Negative  for adenopathy.    all other systems are negative except as noted in the HPI and PMH.    Physical Exam Updated Vital Signs BP 122/74 (BP Location: Right Arm)   Pulse 91   Temp 98 F (36.7 C) (Oral)   Resp 20   Ht 5\' 1"  (1.549 m)   Wt 92.5 kg   LMP 06/25/2017   SpO2 99%   BMI 38.55 kg/m   Physical Exam  Constitutional: She is oriented to person, place, and time. She appears well-developed and well-nourished. No distress.  HENT:  Head: Normocephalic and atraumatic.  Mouth/Throat: Oropharynx is clear and moist. No oropharyngeal exudate.  Oropharynx is erythematous without asymmetry or uvular deviation. Nasal congestion present.  No sinus tenderness  Eyes: Pupils are equal, round, and reactive  to light. Conjunctivae and EOM are normal.  Neck: Normal range of motion. Neck supple.  No meningismus.  Cardiovascular: Normal rate, regular rhythm, normal heart sounds and intact distal pulses.  No murmur heard. Pulmonary/Chest: Effort normal. No respiratory distress. She has wheezes.  Scattered hospital respiratory wheezing throughout  Abdominal: Soft. There is no tenderness. There is no rebound and no guarding.  Gravid abdomen, nontender  Musculoskeletal: Normal range of motion. She exhibits no edema or tenderness.  Neurological: She is alert and oriented to person, place, and time. No cranial nerve deficit. She exhibits normal muscle tone. Coordination normal.  No ataxia on finger to nose bilaterally. No pronator drift. 5/5 strength throughout. CN 2-12 intact.Equal grip strength. Sensation intact.   Skin: Skin is warm. Capillary refill takes less than 2 seconds.  Psychiatric: She has a normal mood and affect. Her behavior is normal.  Nursing note and vitals reviewed.    ED Treatments / Results  Labs (all labs ordered are listed, but only abnormal results are displayed) Labs Reviewed - No data to display  EKG None  Radiology Dg Chest 2 View  Result Date: 11/19/2017 CLINICAL DATA:  Cough and short of breath EXAM: CHEST - 2 VIEW COMPARISON:  09/15/2016 FINDINGS: The heart size and mediastinal contours are within normal limits. Both lungs are clear. The visualized skeletal structures are unremarkable. IMPRESSION: No active cardiopulmonary disease. Electronically Signed   By: Jasmine Pang M.D.   On: 11/19/2017 23:27    Procedures Procedures (including critical care time)  Medications Ordered in ED Medications  ipratropium-albuterol (DUONEB) 0.5-2.5 (3) MG/3ML nebulizer solution 3 mL (has no administration in time range)     Initial Impression / Assessment and Plan / ED Course  I have reviewed the triage vital signs and the nursing notes.  Pertinent labs & imaging results  that were available during my care of the patient were reviewed by me and considered in my medical decision making (see chart for details).    Patient with history of asthma, currently pregnant [redacted] weeks presenting with shortness of breath, cough, rhinorrhea, congestion.  No fever.  No chest pain. Fetal heart tones 122-143.  She is not tachycardic or hypoxic.  She has scattered respiratory wheezing on exam.  Risks and benefits of chest x-ray discussed with patient and she agrees to proceed.  Low suspicion for pulmonary embolism as this appears to be likely viral URI causing bronchospasm associated with nasal congestion.  She has no chest pain, tachycardia or hypoxia. Mild tachycardia in ED after receiving albuterol.  Chest x-ray is negative for infiltrate.  Patient feels improved after nebulizer.  Suspect viral URI likely causing bronchospasm and asthma exacerbation.  Wheezing has improved.  Will try to avoid steroids in setting of pregnancy.  Wheezing has resolved on recheck.  Patient ambulatory without desaturation. Discussed using albuterol every 4 hours for the next several days as needed.  Increase oral hydration, follow-up with OB this week.  Return to the ED with worsening shortness of breath, chest pain or other concerns.  EMERGENCY DEPARTMENT [place] PREGNANCY "Study: Limited Ultrasound of the Pelvis for Pregnancy"  INDICATIONS:Pregnancy(required) Multiple views of the uterus and pelvic cavity were obtained in real-time with a multi-frequency probe.  APPROACH:Transabdominal  PERFORMED BY: Myself IMAGES ARCHIVED?: Yes LIMITATIONS: Body habitus and Emergent procedure PREGNANCY FREE FLUID: None ADNEXAL FINDINGS:Left ovary not seen and Right ovary not seen GESTATIONAL AGE, ESTIMATE:  FETAL HEART RATE: 143 INTERPRETATION: Fetal pole present and Fetal heart activity seen     Final Clinical Impressions(s) / ED Diagnoses   Final diagnoses:  Viral URI  Bronchospasm    ED  Discharge Orders    None       Malesha Suliman, Jeannett Senior, MD 11/20/17 0236

## 2017-11-19 NOTE — ED Notes (Signed)
ED Provider at bedside. 

## 2017-11-19 NOTE — ED Notes (Signed)
Patient transported to X-ray 

## 2017-11-20 NOTE — Discharge Instructions (Addendum)
Keep yourself hydrated.  Use your albuterol every 4 hours as needed.  Follow-up with your OB doctor.  Return to the ED with chest pain, shortness of breath or any other concerns.

## 2017-11-20 NOTE — ED Notes (Signed)
Patient sat 99 to 100 percent on room air walking. Heart rate 109-127. Patient states that she does not feel short of breath at this time. Patient returned to room at this time.

## 2017-11-28 ENCOUNTER — Other Ambulatory Visit: Payer: Medicaid Other

## 2017-11-28 ENCOUNTER — Encounter: Payer: Medicaid Other | Admitting: Women's Health

## 2017-12-05 ENCOUNTER — Ambulatory Visit (INDEPENDENT_AMBULATORY_CARE_PROVIDER_SITE_OTHER): Payer: Medicaid Other

## 2017-12-05 ENCOUNTER — Ambulatory Visit (INDEPENDENT_AMBULATORY_CARE_PROVIDER_SITE_OTHER): Payer: Medicaid Other | Admitting: Obstetrics & Gynecology

## 2017-12-05 ENCOUNTER — Encounter: Payer: Medicaid Other | Admitting: Women's Health

## 2017-12-05 VITALS — BP 122/74 | HR 79 | Wt 204.0 lb

## 2017-12-05 DIAGNOSIS — Z1389 Encounter for screening for other disorder: Secondary | ICD-10-CM

## 2017-12-05 DIAGNOSIS — Z362 Encounter for other antenatal screening follow-up: Secondary | ICD-10-CM

## 2017-12-05 DIAGNOSIS — Z3A23 23 weeks gestation of pregnancy: Secondary | ICD-10-CM

## 2017-12-05 DIAGNOSIS — Z3482 Encounter for supervision of other normal pregnancy, second trimester: Secondary | ICD-10-CM

## 2017-12-05 DIAGNOSIS — Z331 Pregnant state, incidental: Secondary | ICD-10-CM

## 2017-12-05 LAB — POCT URINALYSIS DIPSTICK OB
Blood, UA: NEGATIVE
GLUCOSE, UA: NEGATIVE
Ketones, UA: NEGATIVE
LEUKOCYTES UA: NEGATIVE
NITRITE UA: NEGATIVE
PROTEIN: NEGATIVE

## 2017-12-05 NOTE — Progress Notes (Signed)
   LOW-RISK PREGNANCY VISIT Patient name: Mary Hull MRN [id number]  Date of birth: 12-17-1995 Chief Complaint:   Routine Prenatal Visit ([place] today)  History of Present Illness:   Mary Hull is a 22 y.o. G57P1001 female at [redacted]w[redacted]d with an Estimated Date of Delivery: 04/01/18 being seen today for ongoing management of a low-risk pregnancy.  Today she reports back is achy. Contractions: Not present. Vag. Bleeding: None.  Movement: Present. denies leaking of fluid. Review of Systems:   Pertinent items are noted in HPI Denies abnormal vaginal discharge w/ itching/odor/irritation, headaches, visual changes, shortness of breath, chest pain, abdominal pain, severe nausea/vomiting, or problems with urination or bowel movements unless otherwise stated above. Pertinent History Reviewed:  Reviewed past medical,surgical, social, obstetrical and family history.  Reviewed problem list, medications and allergies. Physical Assessment:   Vitals:   12/05/17 1220  BP: 122/74  Pulse: 79  Weight: 204 lb (92.5 kg)  Body mass index is 38.55 kg/m.        Physical Examination:   General appearance: Well appearing, and in no distress  Mental status: Alert, oriented to person, place, and time  Skin: Warm & dry  Cardiovascular: Normal heart rate noted  Respiratory: Normal respiratory effort, no distress  Abdomen: Soft, gravid, nontender  Pelvic: Cervical exam deferred         Extremities: Edema: None  Fetal Status: Fetal Heart Rate (bpm): 154   Movement: Present    Results for orders placed or performed in visit on 12/05/17 (from the past 24 hour(s))  POC Urinalysis Dipstick OB   Collection Time: 12/05/17 11:54 AM  Result Value Ref Range   Color, UA     Clarity, UA     Glucose, UA Negative Negative   Bilirubin, UA     Ketones, UA neg    Spec Grav, UA     Blood, UA neg    pH, UA     POC,PROTEIN,UA Negative Negative, Trace   Urobilinogen, UA     Nitrite, UA neg    Leukocytes, UA  Negative Negative   Appearance     Odor      Assessment & Plan:  1) Low-risk pregnancy G2P1001 at [redacted]w[redacted]d with an Estimated Date of Delivery: 04/01/18   2) Back issues, self care exercises reviewed with the patient   Meds: No orders of the defined types were placed in this encounter.  Labs/procedures today: sonogram report is done  Plan:  Continue routine obstetrical care   Reviewed: Preterm labor symptoms and general obstetric precautions including but not limited to vaginal bleeding, contractions, leaking of fluid and fetal movement were reviewed in detail with the patient.  All questions were answered  Follow-up: Return in about 4 weeks (around 01/02/2018) for PN2, , LROB.  Orders Placed This Encounter  Procedures  . POC Urinalysis Dipstick OB   Amaryllis Dyke   12/05/2017 12:40 PM

## 2017-12-05 NOTE — Progress Notes (Signed)
[place] 23+2 wks,cephalic,cx 5.3 cm,anterior pl gr 0,normal ovaries bilat,fhr 154 bpm,afi 16 cm,mild bilat renal pelvic dilation,LK 4.7 mm,RK 5.2 mm,efw 541 g 24%,anatomy of the heart complete

## 2017-12-12 ENCOUNTER — Telehealth: Payer: Self-pay | Admitting: *Deleted

## 2017-12-12 NOTE — Telephone Encounter (Signed)
Patient states she has been having loose stools this morning and severe cramping when using the bathroom. It all just started this morning and only occurs when she is on the toilet.  Advised patient to take some Immodium to help with the loose stools and cramping is most likely coming from the diarrhea.  Offered appt this morning for patient to be seen to evaluate cervix to make sure she is not dilating but patient declined.  [facility] became more severe or consistent, she started having any bleeding or leaking.  Encouraged to push fluids as well. Pt verbalized understanding with no further questions.

## 2018-01-02 ENCOUNTER — Other Ambulatory Visit: Payer: Medicaid Other

## 2018-01-02 ENCOUNTER — Ambulatory Visit (INDEPENDENT_AMBULATORY_CARE_PROVIDER_SITE_OTHER): Payer: Medicaid Other | Admitting: Women's Health

## 2018-01-02 ENCOUNTER — Encounter: Payer: Self-pay | Admitting: Women's Health

## 2018-01-02 VITALS — BP 127/82 | HR 90 | Wt 209.0 lb

## 2018-01-02 DIAGNOSIS — Z3A27 27 weeks gestation of pregnancy: Secondary | ICD-10-CM

## 2018-01-02 DIAGNOSIS — Z331 Pregnant state, incidental: Secondary | ICD-10-CM

## 2018-01-02 DIAGNOSIS — O358XX0 Maternal care for other (suspected) fetal abnormality and damage, not applicable or unspecified: Secondary | ICD-10-CM

## 2018-01-02 DIAGNOSIS — Z23 Encounter for immunization: Secondary | ICD-10-CM

## 2018-01-02 DIAGNOSIS — Z3482 Encounter for supervision of other normal pregnancy, second trimester: Secondary | ICD-10-CM

## 2018-01-02 DIAGNOSIS — O35EXX0 Maternal care for other (suspected) fetal abnormality and damage, fetal genitourinary anomalies, not applicable or unspecified: Secondary | ICD-10-CM

## 2018-01-02 DIAGNOSIS — Z1389 Encounter for screening for other disorder: Secondary | ICD-10-CM

## 2018-01-02 LAB — POCT URINALYSIS DIPSTICK OB
Blood, UA: NEGATIVE
GLUCOSE, UA: NEGATIVE
KETONES UA: NEGATIVE
Leukocytes, UA: NEGATIVE
Nitrite, UA: NEGATIVE
POC,PROTEIN,UA: NEGATIVE

## 2018-01-02 NOTE — Progress Notes (Signed)
   LOW-RISK PREGNANCY VISIT Patient name: Mary Hull MRN [id number]  Date of birth: 08/22/1995 Chief Complaint:   Routine Prenatal Visit  History of Present Illness:   Mary Hull is a 22 y.o. 332P1001 female at 7025w2d with an Estimated Date of Delivery: 04/01/18 being seen today for ongoing management of a low-risk pregnancy.  Today she reports no complaints. Contractions: Not present. Vag. Bleeding: None.  Movement: Present. denies leaking of fluid. Review of Systems:   Pertinent items are noted in HPI Denies abnormal vaginal discharge w/ itching/odor/irritation, headaches, visual changes, shortness of breath, chest pain, abdominal pain, severe nausea/vomiting, or problems with urination or bowel movements unless otherwise stated above. Pertinent History Reviewed:  Reviewed past medical,surgical, social, obstetrical and family history.  Reviewed problem list, medications and allergies. Physical Assessment:   Vitals:   01/02/18 0944  BP: 127/82  Pulse: 90  Weight: 209 lb (94.8 kg)  Body mass index is 39.49 kg/m.        Physical Examination:   General appearance: Well appearing, and in no distress  Mental status: Alert, oriented to person, place, and time  Skin: Warm & dry  Cardiovascular: Normal heart rate noted  Respiratory: Normal respiratory effort, no distress  Abdomen: Soft, gravid, nontender  Pelvic: Cervical exam deferred         Extremities: Edema: None  Fetal Status: Fetal Heart Rate (bpm): 140 Fundal Height: 28 cm Movement: Present    Results for orders placed or performed in visit on 01/02/18 (from the past 24 hour(s))  POC Urinalysis Dipstick OB   Collection Time: 01/02/18  9:46 AM  Result Value Ref Range   Color, UA     Clarity, UA     Glucose, UA Negative Negative   Bilirubin, UA     Ketones, UA neg    Spec Grav, UA     Blood, UA neg    pH, UA     POC,PROTEIN,UA Negative Negative, Trace, Small (1+), Moderate (2+), Large (3+), 4+   Urobilinogen, UA     Nitrite, UA neg    Leukocytes, UA Negative Negative   Appearance     Odor      Assessment & Plan:  1) Low-risk pregnancy G2P1001 at 1925w2d with an Estimated Date of Delivery: 04/01/18   2) H/O GDM, pn2 today  3) Mild bilateral fetal RPD> repeat u/s next visit    Meds: No orders of the defined types were placed in this encounter.  Labs/procedures today: pn2, tdap  Plan:  Continue routine obstetrical care   Reviewed: Preterm labor symptoms and general obstetric precautions including but not limited to vaginal bleeding, contractions, leaking of fluid and fetal movement were reviewed in detail with the patient.  All questions were answered  Follow-up: Return in about 4 weeks (around 01/30/2018) for LROB, US:OB F/U kidneys.  Orders Placed This Encounter  Procedures  . US OB Follow Up  . Tdap vaccine greater than or equal to 7yo IM  . POC Urinalysis Dipstick OB   Cheral MarkerKimberly R  CNM, [facility] 01/02/2018 10:08 AM

## 2018-01-02 NOTE — Patient Instructions (Signed)
Rhina BrackettKatelynn L Sanger, I greatly value your feedback.  If you receive a survey following your visit with Mary Hull today, we appreciate you taking the time to fill it out.  Thanks, Joellyn HaffKim Jaree Trinka, CNM, WHNP-BC   Call the office [phone]) or go to [facility] if:  You begin to have strong, frequent contractions  Your water breaks.  Sometimes it is a big gush of fluid, sometimes it is just a trickle that keeps getting your panties wet or running down your legs  You have vaginal bleeding.  It is normal to have a small amount of spotting if your cervix was checked.   You don't feel your baby moving like normal.  If you don't, get you something to eat and drink and lay down and focus on feeling your baby move.  You should feel at least 10 movements in 2 hours.  If you don't, you should call the office or go to [facility].    Tdap Vaccine  It is recommended that you get the Tdap vaccine during the third trimester of EACH pregnancy to help protect your baby from getting pertussis (whooping cough)  27-36 weeks is the BEST time to do this so that you can pass the protection on to your baby. During pregnancy is better than after pregnancy, but if you are unable to get it during pregnancy it will be offered at the hospital.   You can get this vaccine with Mary Hull, at the health department, your family doctor, or some local pharmacies  Everyone who will be around your baby should also be up-to-date on their vaccines before the baby comes. Adults (who are not pregnant) only need 1 dose of Tdap during adulthood.   Third Trimester of Pregnancy The third trimester is from week 29 through week 42, months 7 through 9. The third trimester is a time when the fetus is growing rapidly. At the end of the ninth month, the fetus is about 20 inches in length and weighs 6-10 pounds.  BODY CHANGES Your body goes through many changes during pregnancy. The changes vary from woman to woman.   Your weight will continue to  increase. You can expect to gain 25-35 pounds (11-16 kg) by the end of the pregnancy.  You may begin to get stretch marks on your hips, abdomen, and breasts.  You may urinate more often because the fetus is moving lower into your pelvis and pressing on your bladder.  You may develop or continue to have heartburn as a result of your pregnancy.  You may develop constipation because certain hormones are causing the muscles that push waste through your intestines to slow down.  You may develop hemorrhoids or swollen, bulging veins (varicose veins).  You may have pelvic pain because of the weight gain and pregnancy hormones relaxing your joints between the bones in your pelvis. Backaches may result from overexertion of the muscles supporting your posture.  You may have changes in your hair. These can include thickening of your hair, rapid growth, and changes in texture. Some women also have hair loss during or after pregnancy, or hair that feels dry or thin. Your hair will most likely return to normal after your baby is born.  Your breasts will continue to grow and be tender. A yellow discharge may leak from your breasts called colostrum.  Your belly button may stick out.  You may feel short of breath because of your expanding uterus.  You may notice the fetus "dropping," or moving lower  in your abdomen.  You may have a bloody mucus discharge. This usually occurs a few days to a week before labor begins.  Your cervix becomes thin and soft (effaced) near your due date. WHAT TO EXPECT AT YOUR PRENATAL EXAMS  You will have prenatal exams every 2 weeks until week 36. Then, you will have weekly prenatal exams. During a routine prenatal visit:  You will be weighed to make sure you and the fetus are growing normally.  Your blood pressure is taken.  Your abdomen will be measured to track your baby's growth.  The fetal heartbeat will be listened to.  Any test results from the previous visit  will be discussed.  You may have a cervical check near your due date to see if you have effaced. At around 36 weeks, your caregiver will check your cervix. At the same time, your caregiver will also perform a test on the secretions of the vaginal tissue. This test is to determine if a type of bacteria, Group B streptococcus, is present. Your caregiver will explain this further. Your caregiver may ask you:  What your birth plan is.  How you are feeling.  If you are feeling the baby move.  If you have had any abnormal symptoms, such as leaking fluid, bleeding, severe headaches, or abdominal cramping.  If you have any questions. Other tests or screenings that may be performed during your third trimester include:  Blood tests that check for low iron levels (anemia).  Fetal testing to check the health, activity level, and growth of the fetus. Testing is done if you have certain medical conditions or if there are problems during the pregnancy. FALSE LABOR You may feel small, irregular contractions that eventually go away. These are called Braxton Hicks contractions, or false labor. Contractions may last for hours, days, or even weeks before true labor sets in. If contractions come at regular intervals, intensify, or become painful, it is best to be seen by your caregiver.  SIGNS OF LABOR   Menstrual-like cramps.  Contractions that are 5 minutes apart or less.  Contractions that start on the top of the uterus and spread down to the lower abdomen and back.  A sense of increased pelvic pressure or back pain.  A watery or bloody mucus discharge that comes from the vagina. If you have any of these signs before the 37th week of pregnancy, call your caregiver right away. You need to go to the hospital to get checked immediately. HOME CARE INSTRUCTIONS   Avoid all smoking, herbs, alcohol, and unprescribed drugs. These chemicals affect the formation and growth of the baby.  Follow your  caregiver's instructions regarding medicine use. There are medicines that are either safe or unsafe to take during pregnancy.  Exercise only as directed by your caregiver. Experiencing uterine cramps is a good sign to stop exercising.  Continue to eat regular, healthy meals.  Wear a good support bra for breast tenderness.  Do not use hot tubs, steam rooms, or saunas.  Wear your seat belt at all times when driving.  Avoid raw meat, uncooked cheese, cat litter boxes, and soil used by cats. These carry germs that can cause birth defects in the baby.  Take your prenatal vitamins.  Try taking a stool softener (if your caregiver approves) if you develop constipation. Eat more high-fiber foods, such as fresh vegetables or fruit and whole grains. Drink plenty of fluids to keep your urine clear or pale yellow.  Take warm sitz baths   to soothe any pain or discomfort caused by hemorrhoids. Use hemorrhoid cream if your caregiver approves.  If you develop varicose veins, wear support hose. Elevate your feet for 15 minutes, 3-4 times a day. Limit salt in your diet.  Avoid heavy lifting, wear low heal shoes, and practice good posture.  Rest a lot with your legs elevated if you have leg cramps or low back pain.  Visit your dentist if you have not gone during your pregnancy. Use a soft toothbrush to brush your teeth and be gentle when you floss.  A sexual relationship may be continued unless your caregiver directs you otherwise.  Do not travel far distances unless it is absolutely necessary and only with the approval of your caregiver.  Take prenatal classes to understand, practice, and ask questions about the labor and delivery.  Make a trial run to the hospital.  Pack your hospital bag.  Prepare the baby's nursery.  Continue to go to all your prenatal visits as directed by your caregiver. SEEK MEDICAL CARE IF:  You are unsure if you are in labor or if your water has broken.  You have  dizziness.  You have mild pelvic cramps, pelvic pressure, or nagging pain in your abdominal area.  You have persistent nausea, vomiting, or diarrhea.  You have a bad smelling vaginal discharge.  You have pain with urination. SEEK IMMEDIATE MEDICAL CARE IF:   You have a fever.  You are leaking fluid from your vagina.  You have spotting or bleeding from your vagina.  You have severe abdominal cramping or pain.  You have rapid weight loss or gain.  You have shortness of breath with chest pain.  You notice sudden or extreme swelling of your face, hands, ankles, feet, or legs.  You have not felt your baby move in over an hour.  You have severe headaches that do not go away with medicine.  You have vision changes. Document Released: 01/08/2001 Document Revised: 01/19/2013 Document Reviewed: 03/17/2012 [facility] Patient Information 2015 [place], [place]. This information is not intended to replace advice given to you by your health care provider. Make sure you discuss any questions you have with your health care provider.

## 2018-01-03 LAB — CBC
Hematocrit: 35 % (ref 34.0–46.6)
Hemoglobin: 12 g/dL (ref 11.1–15.9)
MCH: 29.6 pg (ref 26.6–33.0)
MCHC: 34.3 g/dL (ref 31.5–35.7)
MCV: 86 fL (ref 79–97)
PLATELETS: 241 10*3/uL (ref 150–450)
RBC: 4.05 x10E6/uL (ref 3.77–5.28)
RDW: 13.5 % (ref 12.3–15.4)
WBC: 8.2 10*3/uL (ref 3.4–10.8)

## 2018-01-03 LAB — ANTIBODY SCREEN: Antibody Screen: NEGATIVE

## 2018-01-03 LAB — RPR: RPR: NONREACTIVE

## 2018-01-03 LAB — HIV ANTIBODY (ROUTINE TESTING W REFLEX): HIV SCREEN 4TH GENERATION: NONREACTIVE

## 2018-01-03 LAB — GLUCOSE TOLERANCE, 2 HOURS W/ 1HR
Glucose, 1 hour: 111 mg/dL (ref 65–179)
Glucose, 2 hour: 88 mg/dL (ref 65–152)
Glucose, Fasting: 94 mg/dL — ABNORMAL HIGH (ref 65–91)

## 2018-01-28 NOTE — L&D Delivery Note (Signed)
Delivery Note Progressed quickly to complete dilation and pushed well to SVD.  At 12:20 AM a viable and healthy female was delivered via Vaginal, Spontaneous (Presentation:OA  ).  APGAR: 9, 9; weight  .  No difficulty with shoulders.  Placenta status: spontaneous and grossly intact with 3 vessel Cord:  with the following complications: . none  Anesthesia:  Epidural Episiotomy: None Lacerations:  Periclitoral Suture Repair: 4-0 monocryl Est. Blood Loss (mL): 250  Mom to postpartum.  Baby to Couplet care / Skin to Skin.  Charlynne Tubbs 03/26/2018, 12:57 AM  Please schedule this patient for Postpartum visit in: 4 weeks with the following provider: Any provider For C/S patients schedule nurse incision check in weeks 2 weeks: no High risk pregnancy complicated by: GDM Delivery mode:  SVD Anticipated Birth Control:  other/unsure PP Procedures needed: 2 hour GTT  Schedule Integrated BH visit: no

## 2018-01-30 ENCOUNTER — Ambulatory Visit (INDEPENDENT_AMBULATORY_CARE_PROVIDER_SITE_OTHER): Payer: Medicaid Other

## 2018-01-30 ENCOUNTER — Ambulatory Visit (INDEPENDENT_AMBULATORY_CARE_PROVIDER_SITE_OTHER): Payer: Medicaid Other | Admitting: Women's Health

## 2018-01-30 ENCOUNTER — Encounter: Payer: Self-pay | Admitting: Women's Health

## 2018-01-30 VITALS — BP 122/75 | HR 91 | Wt 210.0 lb

## 2018-01-30 DIAGNOSIS — Z331 Pregnant state, incidental: Secondary | ICD-10-CM

## 2018-01-30 DIAGNOSIS — Z1389 Encounter for screening for other disorder: Secondary | ICD-10-CM

## 2018-01-30 DIAGNOSIS — O358XX0 Maternal care for other (suspected) fetal abnormality and damage, not applicable or unspecified: Secondary | ICD-10-CM | POA: Diagnosis not present

## 2018-01-30 DIAGNOSIS — Z3A31 31 weeks gestation of pregnancy: Secondary | ICD-10-CM

## 2018-01-30 DIAGNOSIS — O35EXX0 Maternal care for other (suspected) fetal abnormality and damage, fetal genitourinary anomalies, not applicable or unspecified: Secondary | ICD-10-CM

## 2018-01-30 DIAGNOSIS — O24410 Gestational diabetes mellitus in pregnancy, diet controlled: Secondary | ICD-10-CM

## 2018-01-30 DIAGNOSIS — Z3482 Encounter for supervision of other normal pregnancy, second trimester: Secondary | ICD-10-CM

## 2018-01-30 DIAGNOSIS — O0993 Supervision of high risk pregnancy, unspecified, third trimester: Secondary | ICD-10-CM

## 2018-01-30 DIAGNOSIS — O24419 Gestational diabetes mellitus in pregnancy, unspecified control: Secondary | ICD-10-CM | POA: Insufficient documentation

## 2018-01-30 LAB — POCT URINALYSIS DIPSTICK OB
Blood, UA: NEGATIVE
GLUCOSE, UA: NEGATIVE
KETONES UA: NEGATIVE
Leukocytes, UA: NEGATIVE
Nitrite, UA: NEGATIVE
POC,PROTEIN,UA: NEGATIVE

## 2018-01-30 NOTE — Progress Notes (Signed)
HIGH-RISK PREGNANCY VISIT Patient name: Mary Hull MRN [id number]  Date of birth: [birth year] Chief Complaint:   Routine Prenatal Visit ([place] today)  History of Present Illness:   Mary Hull is a 23 y.o. G16P1001 female at [redacted]w[redacted]d with an Estimated Date of Delivery: 04/01/18 being seen today for ongoing management of a high-risk pregnancy complicated by A1DM, fetal RPD.  Today she reports still has DM supplies from last pregnancy, has been checking FBS x 1wk, reports some >95/some <95-unsure of how many. Doesn't check during day while at work, checks when she gets home, then 2hrs after supper (reports have been <120). Didn't go to dietician last pregnancy. Contractions: Not present. Vag. Bleeding: None.  Movement: Present. denies leaking of fluid.  Review of Systems:   Pertinent items are noted in HPI Denies abnormal vaginal discharge w/ itching/odor/irritation, headaches, visual changes, shortness of breath, chest pain, abdominal pain, severe nausea/vomiting, or problems with urination or bowel movements unless otherwise stated above. Pertinent History Reviewed:  Reviewed past medical,surgical, social, obstetrical and family history.  Reviewed problem list, medications and allergies. Physical Assessment:   Vitals:   01/30/18 1117  BP: 122/75  Pulse: 91  Weight: 210 lb (95.3 kg)  Body mass index is 39.68 kg/m.           Physical Examination:   General appearance: alert, well appearing, and in no distress  Mental status: alert, oriented to person, place, and time  Skin: warm & dry   Extremities: Edema: None    Cardiovascular: normal heart rate noted  Respiratory: normal respiratory effort, no distress  Abdomen: gravid, soft, non-tender  Pelvic: Cervical exam deferred         Fetal Status: Fetal Heart Rate (bpm): 135 u/s Fundal Height: 31 cm Movement: Present    Fetal Surveillance Testing today: u/s  [place] 31+2 wks,cephalic,cx 4.6 cm,afi 13 cm,anterior placenta gr 3,bilat  adnexa's wnl,fhr 135 bpm,left renal pelvic dilation 10 mm w/dilated ureter,right renal pelvis 4.1 mm (wnl),EFW 1835 g 54 %  Results for orders placed or performed in visit on 01/30/18 (from the past 24 hour(s))  POC Urinalysis Dipstick OB   Collection Time: 01/30/18 11:17 AM  Result Value Ref Range   Color, UA     Clarity, UA     Glucose, UA Negative Negative   Bilirubin, UA     Ketones, UA neg    Spec Grav, UA     Blood, UA neg    pH, UA     POC,PROTEIN,UA Negative Negative, Trace, Small (1+), Moderate (2+), Large (3+), 4+   Urobilinogen, UA     Nitrite, UA neg    Leukocytes, UA Negative Negative   Appearance     Odor      Assessment & Plan:  1) High-risk pregnancy G2P1001 at [redacted]w[redacted]d with an Estimated Date of Delivery: 04/01/18   2) A1DM, not consistently checking sugars, hasn't been to dietician. Discussed time/goals of QID testing, dietician referral ordered today, f/u 1wk  3) Fetal Lt RPD, 31mm w/ dilated ureter w/ normal AFI, discussed w/ LHE, repeat u/s @ 36wks  Meds: No orders of the defined types were placed in this encounter.  Labs/procedures today: u/s  Treatment Plan:  Growth u/s @  36wk     Deliver @ 39-40wks:____   Reviewed: Preterm labor symptoms and general obstetric precautions including but not limited to vaginal bleeding, contractions, leaking of fluid and fetal movement were reviewed in detail with the patient.  All questions were answered.  Follow-up: Return in about 1 week (around 02/06/2018) for HROB.  Orders Placed This Encounter  Procedures  . Referral to Nutrition and Diabetes Services  . POC Urinalysis Dipstick OB   Cheral Marker CNM, [facility] 01/30/2018 11:55 AM

## 2018-01-30 NOTE — Progress Notes (Signed)
US 31+2 wks,cephalic,cx 4.6 cm,afi 13 cm,anterior placenta gr 3,bilat adnexa's wnl,fhr 135 bpm,left renal pelvic dilation 10 mm w/dilated ureter,right renal pelvis 4.1 mm (wnl),EFW 1835 g 54 %

## 2018-01-30 NOTE — Patient Instructions (Signed)
Check blood sugars 4 times a day: in the morning before eating/drinking anything (<95) and 2 hours after eating breakfast, lunch, and supper (<120).    Mary Hull, I greatly value your feedback.  If you receive a survey following your visit with [place] today, we appreciate you taking the time to fill it out.  Thanks, Mary Hull, CNM, WHNP-BC   Call the office [phone]) or go to [facility] if:  You begin to have strong, frequent contractions  Your water breaks.  Sometimes it is a big gush of fluid, sometimes it is just a trickle that keeps getting your panties wet or running down your legs  You have vaginal bleeding.  It is normal to have a small amount of spotting if your cervix was checked.   You don't feel your baby moving like normal.  If you don't, get you something to eat and drink and lay down and focus on feeling your baby move.  You should feel at least 10 movements in 2 hours.  If you don't, you should call the office or go to [facility]     Gestational Diabetes Mellitus, Self Care When you have gestational diabetes (gestational diabetes mellitus), you must make sure your blood sugar (glucose) stays in a healthy range. You can do this with:  Nutrition.  Exercise.  Lifestyle changes.  Medicines or insulin, if needed.  Support from your doctors and others. If you get treated for this condition, it may not hurt you or your unborn baby (fetus). If you do not get treated for this condition, it may cause problems that can hurt you or your unborn baby. If you get gestational diabetes, you are:  More likely to get it if you get pregnant again.  More likely to develop type 2 diabetes in the future. How to stay aware of blood sugar   Check your blood sugar every day while you are pregnant. Check it as often as told.  Call your doctor if your blood sugar is above your goal numbers for two tests in a row. Your doctor will set personal treatment goals for you.  Generally, you should have these blood sugar levels:  Before meals, or after not eating for a long time (fasting or preprandial): at or below 95 mg/dL (5.3 mmol/L).  After meals (postprandial): ? One hour after a meal: at or below 140 mg/dL (7.8 mmol/L). ? Two hours after a meal: at or below 120 mg/dL (6.7 mmol/L).  A1c (hemoglobin A1c) level: 6-6.5%. How to manage high and low blood sugar Signs of high blood sugar High blood sugar is called hyperglycemia. Know the early signs of high blood sugar. Signs may include:  Feeling: ? Thirsty. ? Hungry. ? Very tired.  Needing to pee (urinate) more than usual.  Blurry vision. Signs of low blood sugar Low blood sugar is called hypoglycemia. This is when blood sugar is at or below 70 mg/dL (3.9 mmol/L). Signs may include:  Feeling: ? Hungry. ? Worried or nervous (anxious). ? Sweaty and clammy. ? Confused. ? Dizzy. ? Sleepy. ? Sick to your stomach (nauseous).  Having: ? A fast heartbeat. ? A headache. ? A change in your vision. ? Tingling or no feeling (numbness) around your mouth, lips, or tongue. ? Jerky movements that you cannot control (seizure).  Having trouble with: ? Moving (coordination). ? Sleeping. ? Passing out (fainting). ? Getting upset easily (irritability). Treating low blood sugar To treat low blood sugar, eat or drink something sugary  right away. If you can think clearly and swallow safely, follow the 15:15 rule:  Take 15 grams of a fast-acting carb (carbohydrate). Talk with your doctor about how much you should take.  Some fast-acting carbs are: ? Sugar tablets (glucose pills). Take 3-4 glucose pills. ? 6-8 pieces of hard candy. ? 4-6 oz (120-150 mL) of fruit juice. ? 4-6 oz (120-150 mL) of regular (not diet) soda. ? 1 Tbsp (15 mL) honey or sugar.  Check your blood sugar 15 minutes after you take the carb.  If your blood sugar is still at or below 70 mg/dL (3.9 mmol/L), take 15 grams of a carb  again.  If your blood sugar does not go above 70 mg/dL (3.9 mmol/L) after 3 tries, get help right away.  After your blood sugar goes back to normal, eat a meal or a snack within 1 hour. Treating very low blood sugar If your blood sugar is at or below 54 mg/dL (3 mmol/L), you have very low blood sugar (severe hypoglycemia). This is an emergency. Do not wait to see if the symptoms will go away. Get medical help right away. Call your local emergency services (911 in the [place] If you have very low blood sugar and you cannot eat or drink, you may need a glucagon shot (injection). A family member or friend should learn how to check your blood sugar and how to give you a glucagon shot. Ask your doctor if you need to have a glucagon shot kit at home. Follow these instructions at home: Medicine  Take your insulin and diabetes medicines as told.  If your doctor says you should take more or less insulin or medicines, do this exactly as told.  Do not run out of insulin or medicines. Food   Make healthy food choices. These include: ? Chicken, fish, egg whites, and beans. ? Oats, whole wheat, bulgur, brown rice, quinoa, and millet. ? Fresh fruits and vegetables. ? Low-fat dairy products. ? Nuts, avocado, olive oil, and canola oil.  Meet with a food specialist (dietitian). He or she can help you make an eating plan that is right for you.  Follow instructions from your doctor about what you cannot eat or drink.  Drink enough fluid to keep your pee (urine) pale yellow.  Eat healthy snacks between healthy meals.  Keep track of carbs that you eat. Do this by reading food labels and learning food serving sizes.  Follow your sick day plan when you cannot eat or drink normally. Make this plan with your doctor so it is ready to use. Activity  Exercise for 30 or more minutes a day, or as much as your doctor recommends.  Talk with your doctor before you start a new exercise or activity. Your doctor  may need to tell you to change: ? How much insulin or medicines you take. ? How much food you eat. Lifestyle  Do not drink alcohol.  Do not use any tobacco products. These include cigarettes, chewing tobacco, and e-cigarettes. If you need help quitting, ask your doctor.  Learn how to deal with stress. If you need help with this, ask your doctor. Body care  Stay up to date with your shots (immunizations).  Brush your teeth and gums two times a day. Floss one or more times a day.  Go to the dentist one or more times every 6 months.  Stay at a healthy weight while you are pregnant. General instructions  Take over-the-counter and prescription medicines only  as told by your doctor.  Ask your doctor about risks of high blood pressure in pregnancy (preeclampsia and eclampsia).  Share your diabetes care plan with: ? Your work or school. ? People you live with.  Check your pee for ketones: ? When you are sick. ? As told by your doctor.  Carry a card or wear jewelry that says you have diabetes.  Keep all follow-up visits as told by your doctor. This is important. Care after giving birth  Have your blood sugar checked 4-12 weeks after you give birth.  Get checked for diabetes one or more times during 3 years. Questions to ask your doctor  Do I need to meet with a diabetes educator?  Where can I find a support group for people with gestational diabetes? Where to find more information To learn more about diabetes, visit:  American Diabetes Association: www.diabetes.org  Centers for Disease Control and Prevention (CDC): http://www.wolf.info/ Summary  Check your blood sugar (glucose) every day while you are pregnant. Check it as often as told.  Take your insulin and diabetes medicines as told.  Keep all follow-up visits as told by your doctor. This is important.  Have your blood sugar checked 4-12 weeks after you give birth. This information is not intended to replace advice given  to you by your health care provider. Make sure you discuss any questions you have with your health care provider. Document Released: 05/08/2015 Document Revised: 07/07/2017 Document Reviewed: 02/17/2015 Elsevier Interactive Patient Education  2019 [employer].

## 2018-02-03 ENCOUNTER — Encounter: Payer: Self-pay | Admitting: *Deleted

## 2018-02-03 ENCOUNTER — Telehealth: Payer: Self-pay | Admitting: *Deleted

## 2018-02-03 NOTE — Telephone Encounter (Signed)
Prescription for lancets and strips for Accucheck Aviva called to [facility].  QID testing, dispense 100 with PRN refills.

## 2018-02-06 ENCOUNTER — Encounter: Payer: Self-pay | Admitting: Obstetrics & Gynecology

## 2018-02-06 ENCOUNTER — Ambulatory Visit (INDEPENDENT_AMBULATORY_CARE_PROVIDER_SITE_OTHER): Payer: Medicaid Other | Admitting: Obstetrics & Gynecology

## 2018-02-06 VITALS — BP 123/78 | HR 108 | Wt 206.0 lb

## 2018-02-06 DIAGNOSIS — Z331 Pregnant state, incidental: Secondary | ICD-10-CM

## 2018-02-06 DIAGNOSIS — Z1389 Encounter for screening for other disorder: Secondary | ICD-10-CM

## 2018-02-06 DIAGNOSIS — O24419 Gestational diabetes mellitus in pregnancy, unspecified control: Secondary | ICD-10-CM

## 2018-02-06 DIAGNOSIS — O0993 Supervision of high risk pregnancy, unspecified, third trimester: Secondary | ICD-10-CM

## 2018-02-06 DIAGNOSIS — Z3A32 32 weeks gestation of pregnancy: Secondary | ICD-10-CM

## 2018-02-06 LAB — POCT URINALYSIS DIPSTICK OB
Blood, UA: NEGATIVE
Glucose, UA: NEGATIVE
LEUKOCYTES UA: NEGATIVE
NITRITE UA: NEGATIVE

## 2018-02-06 MED ORDER — GLYBURIDE 5 MG PO TABS: ORAL_TABLET | ORAL | 1 refills | Status: DC

## 2018-02-06 NOTE — Progress Notes (Signed)
   HIGH-RISK PREGNANCY VISIT Patient name: Mary Hull MRN [id number]  Date of birth: 1995-06-27 Chief Complaint:   High Risk Gestation  History of Present Illness:   Mary Hull is a 23 y.o. G60P1001 female at [redacted]w[redacted]d with an Estimated Date of Delivery: 04/01/18 being seen today for ongoing management of a high-risk pregnancy complicated by class  A1 DM.  Today she reports no complaints. Contractions: Not present.  .  Movement: Present. denies leaking of fluid.  Review of Systems:   Pertinent items are noted in HPI Denies abnormal vaginal discharge w/ itching/odor/irritation, headaches, visual changes, shortness of breath, chest pain, abdominal pain, severe nausea/vomiting, or problems with urination or bowel movements unless otherwise stated above. Pertinent History Reviewed:  Reviewed past medical,surgical, social, obstetrical and family history.  Reviewed problem list, medications and allergies. Physical Assessment:   Vitals:   02/06/18 1156  BP: 123/78  Pulse: (!) 108  Weight: 206 lb (93.4 kg)  Body mass index is 38.92 kg/m.           Physical Examination:   General appearance: alert, well appearing, and in no distress  Mental status: alert, oriented to person, place, and time  Skin: warm & dry   Extremities: Edema: None    Cardiovascular: normal heart rate noted  Respiratory: normal respiratory effort, no distress  Abdomen: gravid, soft, non-tender  Pelvic: Cervical exam deferred         Fetal Status: Fetal Heart Rate (bpm): 150 Fundal Height: 32 cm Movement: Present    Fetal Surveillance Testing today: FHR 150   Results for orders placed or performed in visit on 02/06/18 (from the past 24 hour(s))  POC Urinalysis Dipstick OB   Collection Time: 02/06/18 12:02 PM  Result Value Ref Range   Color, UA     Clarity, UA     Glucose, UA Negative Negative   Bilirubin, UA     Ketones, UA large    Spec Grav, UA     Blood, UA neg    pH, UA     POC,PROTEIN,UA  Trace Negative, Trace, Small (1+), Moderate (2+), Large (3+), 4+   Urobilinogen, UA     Nitrite, UA neg    Leukocytes, UA Negative Negative   Appearance     Odor      Assessment & Plan:  1) High-risk pregnancy G2P1001 at [redacted]w[redacted]d with an Estimated Date of Delivery: 04/01/18   2) Class A1 DM, suboptimal control of fastings alone, 2 hours are ok, now Class A2, glyburide 5 mg at bedtime    Meds:  Meds ordered this encounter  Medications  . glyBURIDE (DIABETA) 5 MG tablet    Sig: Take 1 tablet at bedtime    Dispense:  30 tablet    Refill:  1    Labs/procedures today:   Treatment Plan:  Begin twice weekly testing, start glyburide 5 mg at bedtime  Reviewed: Preterm labor symptoms and general obstetric precautions including but not limited to vaginal bleeding, contractions, leaking of fluid and fetal movement were reviewed in detail with the patient.  All questions were answered.  Follow-up: Return in about 4 days (around 02/10/2018) for NST, HROB.  Orders Placed This Encounter  Procedures  . POC Urinalysis Dipstick OB   Lazaro Arms  02/06/2018 12:33 PM

## 2018-02-10 ENCOUNTER — Encounter: Payer: Self-pay | Admitting: Women's Health

## 2018-02-10 ENCOUNTER — Ambulatory Visit (INDEPENDENT_AMBULATORY_CARE_PROVIDER_SITE_OTHER): Payer: Medicaid Other | Admitting: Women's Health

## 2018-02-10 VITALS — BP 113/78 | HR 108 | Wt 206.8 lb

## 2018-02-10 DIAGNOSIS — Z331 Pregnant state, incidental: Secondary | ICD-10-CM

## 2018-02-10 DIAGNOSIS — O24419 Gestational diabetes mellitus in pregnancy, unspecified control: Secondary | ICD-10-CM

## 2018-02-10 DIAGNOSIS — Z3A32 32 weeks gestation of pregnancy: Secondary | ICD-10-CM

## 2018-02-10 DIAGNOSIS — O0990 Supervision of high risk pregnancy, unspecified, unspecified trimester: Secondary | ICD-10-CM

## 2018-02-10 DIAGNOSIS — Z1389 Encounter for screening for other disorder: Secondary | ICD-10-CM

## 2018-02-10 DIAGNOSIS — O0993 Supervision of high risk pregnancy, unspecified, third trimester: Secondary | ICD-10-CM | POA: Diagnosis not present

## 2018-02-10 LAB — POCT URINALYSIS DIPSTICK OB
GLUCOSE, UA: NEGATIVE
Ketones, UA: NEGATIVE
LEUKOCYTES UA: NEGATIVE
Nitrite, UA: NEGATIVE
POC,PROTEIN,UA: NEGATIVE
RBC UA: NEGATIVE

## 2018-02-10 NOTE — Progress Notes (Signed)
HIGH-RISK PREGNANCY VISIT Patient name: Mary Hull MRN [id number]  Date of birth: 31-Jul-1995 Chief Complaint:   Routine Prenatal Visit (NST)  History of Present Illness:   KANESSA MOVA is a 23 y.o. G66P1001 female at [redacted]w[redacted]d with an Estimated Date of Delivery: 04/01/18 being seen today for ongoing management of a high-risk pregnancy complicated by A2DM currently on glyburide 5mg  hs; fetal Lt RPD 60mm w/ dilated ureter.  Today she reports FBS all normal; majority of 2hr pp >120 (130 highest). Reports she is watching/counting carbs/sugars. Has to cancel dietician visit tomorrow- had something come up.  Contractions: Not present. Vag. Bleeding: None.  Movement: Present. denies leaking of fluid.  Review of Systems:   Pertinent items are noted in HPI Denies abnormal vaginal discharge w/ itching/odor/irritation, headaches, visual changes, shortness of breath, chest pain, abdominal pain, severe nausea/vomiting, or problems with urination or bowel movements unless otherwise stated above. Pertinent History Reviewed:  Reviewed past medical,surgical, social, obstetrical and family history.  Reviewed problem list, medications and allergies. Physical Assessment:   Vitals:   02/10/18 0858  BP: 113/78  Pulse: (!) 108  Weight: 206 lb 12.8 oz (93.8 kg)  Body mass index is 39.07 kg/m.           Physical Examination:   General appearance: alert, well appearing, and in no distress  Mental status: alert, oriented to person, place, and time  Skin: warm & dry   Extremities: Edema: None    Cardiovascular: normal heart rate noted  Respiratory: normal respiratory effort, no distress  Abdomen: gravid, soft, non-tender  Pelvic: Cervical exam deferred         Fetal Status: Fetal Heart Rate (bpm): 135 Fundal Height: 33 cm Movement: Present    Fetal Surveillance Testing today: NST: FHR baseline 135 bpm, Variability: moderate, Accelerations:present, Decelerations:  Absent= Cat 1/Reactive Toco:  none    Results for orders placed or performed in visit on 02/10/18 (from the past 24 hour(s))  POC Urinalysis Dipstick OB   Collection Time: 02/10/18  9:04 AM  Result Value Ref Range   Color, UA     Clarity, UA     Glucose, UA Negative Negative   Bilirubin, UA     Ketones, UA neg    Spec Grav, UA     Blood, UA neg    pH, UA     POC,PROTEIN,UA Negative Negative, Trace, Small (1+), Moderate (2+), Large (3+), 4+   Urobilinogen, UA     Nitrite, UA neg    Leukocytes, UA Negative Negative   Appearance     Odor      Assessment & Plan:  1) High-risk pregnancy G2P1001 at 110w6d with an Estimated Date of Delivery: 04/01/18   2) A2DM, unstable, increase glyburide to 5mg  BID, reschedule dietician consult ASAP  3) Fetal Lt RPD w/ dilated ureter, recheck @ 36wks  Meds: No orders of the defined types were placed in this encounter.   Labs/procedures today: nst  Treatment Plan:  Growth u/s @ 36wks     2x/wk testing @ 32wks  Deliver @ 39wks  Reviewed: Preterm labor symptoms and general obstetric precautions including but not limited to vaginal bleeding, contractions, leaking of fluid and fetal movement were reviewed in detail with the patient.  All questions were answered.  Follow-up: Return for fri hrob/nst; then tues/fri hrob/nst (ending 2/25) with efw/bpp on 2/7 instead of nst.  Orders Placed This Encounter  Procedures  . POC Urinalysis Dipstick OB   Cheral Marker  CNM, WHNP-BC 02/10/2018 9:45 AM

## 2018-02-10 NOTE — Patient Instructions (Addendum)
Mary BrackettKatelynn L Hull, I greatly value your feedback.  If you receive a survey following your visit with us today, we appreciate you taking the time to fill it out.  Thanks, Joellyn HaffKim Tavon Corriher, CNM, WHNP-BC  Increase glyburide to 5mg  twice daily   Call the office [phone]) or go to [facility] if:  You begin to have strong, frequent contractions  Your water breaks.  Sometimes it is a big gush of fluid, sometimes it is just a trickle that keeps getting your panties wet or running down your legs  You have vaginal bleeding.  It is normal to have a small amount of spotting if your cervix was checked.   You don't feel your baby moving like normal.  If you don't, get you something to eat and drink and lay down and focus on feeling your baby move.  You should feel at least 10 movements in 2 hours.  If you don't, you should call the office or [facility].     Preterm Labor and Birth Information  The normal length of a pregnancy is 39-41 weeks. Preterm labor is when labor starts before 37 completed weeks of pregnancy. What are the risk factors for preterm labor? Preterm labor is more likely to occur in women who:  Have certain infections during pregnancy such as a bladder infection, sexually transmitted infection, or infection inside the uterus (chorioamnionitis).  Have a shorter-than-normal cervix.  Have gone into preterm labor before.  Have had surgery on their cervix.  Are younger than age 22 or older than age 22.  Are African American.  Are pregnant with twins or multiple babies (multiple gestation).  Take street drugs or smoke while pregnant.  Do not gain enough weight while pregnant.  Became pregnant shortly after having been pregnant. What are the symptoms of preterm labor? Symptoms of preterm labor include:  Cramps similar to those that can happen during a menstrual period. The cramps may happen with diarrhea.  Pain in the abdomen or lower back.  Regular uterine  contractions that may feel like tightening of the abdomen.  A feeling of increased pressure in the pelvis.  Increased watery or bloody mucus discharge from the vagina.  Water breaking (ruptured amniotic sac). Why is it important to recognize signs of preterm labor? It is important to recognize signs of preterm labor because babies who are born prematurely may not be fully developed. This can put them at an increased risk for:  Long-term (chronic) heart and lung problems.  Difficulty immediately after birth with regulating body systems, including blood sugar, body temperature, heart rate, and breathing rate.  Bleeding in the brain.  Cerebral palsy.  Learning difficulties.  Death. These risks are highest for babies who are born before 34 weeks of pregnancy. How is preterm labor treated? Treatment depends on the length of your pregnancy, your condition, and the health of your baby. It may involve:  Having a stitch (suture) placed in your cervix to prevent your cervix from opening too early (cerclage).  Taking or being given medicines, such as: ? Hormone medicines. These may be given early in pregnancy to help support the pregnancy. ? Medicine to stop contractions. ? Medicines to help mature the baby's lungs. These may be prescribed if the risk of delivery is high. ? Medicines to prevent your baby from developing cerebral palsy. If the labor happens before 34 weeks of pregnancy, you may need to stay in the hospital. What should I do if I think I am  in preterm labor? If you think that you are going into preterm labor, call your health care provider right away. How can I prevent preterm labor in future pregnancies? To increase your chance of having a full-term pregnancy:  Do not use any tobacco products, such as cigarettes, chewing tobacco, and e-cigarettes. If you need help quitting, ask your health care provider.  Do not use street drugs or medicines that have not been prescribed  to you during your pregnancy.  Talk with your health care provider before taking any herbal supplements, even if you have been taking them regularly.  Make sure you gain a healthy amount of weight during your pregnancy.  Watch for infection. If you think that you might have an infection, get it checked right away.  Make sure to tell your health care provider if you have gone into preterm labor before. This information is not intended to replace advice given to you by your health care provider. Make sure you discuss any questions you have with your health care provider. Document Released: 04/06/2003 Document Revised: 06/27/2015 Document Reviewed: 06/07/2015 Elsevier Interactive Patient Education  2019 [employer]

## 2018-02-11 ENCOUNTER — Ambulatory Visit: Payer: Medicaid Other

## 2018-02-13 ENCOUNTER — Ambulatory Visit (INDEPENDENT_AMBULATORY_CARE_PROVIDER_SITE_OTHER): Payer: Medicaid Other | Admitting: Advanced Practice Midwife

## 2018-02-13 ENCOUNTER — Encounter: Payer: Self-pay | Admitting: Advanced Practice Midwife

## 2018-02-13 VITALS — BP 115/68 | HR 117 | Wt 206.0 lb

## 2018-02-13 DIAGNOSIS — O24415 Gestational diabetes mellitus in pregnancy, controlled by oral hypoglycemic drugs: Secondary | ICD-10-CM

## 2018-02-13 DIAGNOSIS — Z1389 Encounter for screening for other disorder: Secondary | ICD-10-CM

## 2018-02-13 DIAGNOSIS — Z3A33 33 weeks gestation of pregnancy: Secondary | ICD-10-CM

## 2018-02-13 DIAGNOSIS — Z331 Pregnant state, incidental: Secondary | ICD-10-CM

## 2018-02-13 DIAGNOSIS — O24419 Gestational diabetes mellitus in pregnancy, unspecified control: Secondary | ICD-10-CM

## 2018-02-13 DIAGNOSIS — O0993 Supervision of high risk pregnancy, unspecified, third trimester: Secondary | ICD-10-CM

## 2018-02-13 LAB — POCT URINALYSIS DIPSTICK OB
Blood, UA: NEGATIVE
Glucose, UA: NEGATIVE

## 2018-02-13 NOTE — Patient Instructions (Signed)
Mary Hull, I greatly value your feedback.  If you receive a survey following your visit with [place] today, we appreciate you taking the time to fill it out.  Thanks, Cathie Beams, CNM   Call the office [phone]) or go to [facility] if:  You begin to have strong, frequent contractions  Your water breaks.  Sometimes it is a big gush of fluid, sometimes it is just a trickle that keeps getting your panties wet or running down your legs  You have vaginal bleeding.  It is normal to have a small amount of spotting if your cervix was checked.   You don't feel your baby moving like normal.  If you don't, get you something to eat and drink and lay down and focus on feeling your baby move.  You should feel at least 10 movements in 2 hours.  If you don't, you should call the office or go to [facility].    Tdap Vaccine  It is recommended that you get the Tdap vaccine during the third trimester of EACH pregnancy to help protect your baby from getting pertussis (whooping cough)  27-36 weeks is the BEST time to do this so that you can pass the protection on to your baby. During pregnancy is better than after pregnancy, but if you are unable to get it during pregnancy it will be offered at the hospital.   You will be offered this vaccine in the office after 27 weeks. If you do not have health insurance, you can get this vaccine at the health department or your family doctor  Everyone who will be around your baby should also be up-to-date on their vaccines. Adults (who are not pregnant) only need 1 dose of Tdap during adulthood.   Third Trimester of Pregnancy The third trimester is from week 29 through week 42, months 7 through 9. The third trimester is a time when the fetus is growing rapidly. At the end of the ninth month, the fetus is about 20 inches in length and weighs 6-10 pounds.  BODY CHANGES Your body goes through many changes during pregnancy. The changes vary from  woman to woman.   Your weight will continue to increase. You can expect to gain 25-35 pounds (11-16 kg) by the end of the pregnancy.  You may begin to get stretch marks on your hips, abdomen, and breasts.  You may urinate more often because the fetus is moving lower into your pelvis and pressing on your bladder.  You may develop or continue to have heartburn as a result of your pregnancy.  You may develop constipation because certain hormones are causing the muscles that push waste through your intestines to slow down.  You may develop hemorrhoids or swollen, bulging veins (varicose veins).  You may have pelvic pain because of the weight gain and pregnancy hormones relaxing your joints between the bones in your pelvis. Backaches may result from overexertion of the muscles supporting your posture.  You may have changes in your hair. These can include thickening of your hair, rapid growth, and changes in texture. Some women also have hair loss during or after pregnancy, or hair that feels dry or thin. Your hair will most likely return to normal after your baby is born.  Your breasts will continue to grow and be tender. A yellow discharge may leak from your breasts called colostrum.  Your belly button may stick out.  You may feel short of breath because of your expanding uterus.  You may notice the fetus "dropping," or moving lower in your abdomen.  You may have a bloody mucus discharge. This usually occurs a few days to a week before labor begins.  Your cervix becomes thin and soft (effaced) near your due date. WHAT TO EXPECT AT YOUR PRENATAL EXAMS  You will have prenatal exams every 2 weeks until week 36. Then, you will have weekly prenatal exams. During a routine prenatal visit:  You will be weighed to make sure you and the fetus are growing normally.  Your blood pressure is taken.  Your abdomen will be measured to track your baby's growth.  The fetal heartbeat will be listened  to.  Any test results from the previous visit will be discussed.  You may have a cervical check near your due date to see if you have effaced. At around 36 weeks, your caregiver will check your cervix. At the same time, your caregiver will also perform a test on the secretions of the vaginal tissue. This test is to determine if a type of bacteria, Group B streptococcus, is present. Your caregiver will explain this further. Your caregiver may ask you:  What your birth plan is.  How you are feeling.  If you are feeling the baby move.  If you have had any abnormal symptoms, such as leaking fluid, bleeding, severe headaches, or abdominal cramping.  If you have any questions. Other tests or screenings that may be performed during your third trimester include:  Blood tests that check for low iron levels (anemia).  Fetal testing to check the health, activity level, and growth of the fetus. Testing is done if you have certain medical conditions or if there are problems during the pregnancy. FALSE LABOR You may feel small, irregular contractions that eventually go away. These are called Braxton Hicks contractions, or false labor. Contractions may last for hours, days, or even weeks before true labor sets in. If contractions come at regular intervals, intensify, or become painful, it is best to be seen by your caregiver.  SIGNS OF LABOR   Menstrual-like cramps.  Contractions that are 5 minutes apart or less.  Contractions that start on the top of the uterus and spread down to the lower abdomen and back.  A sense of increased pelvic pressure or back pain.  A watery or bloody mucus discharge that comes from the vagina. If you have any of these signs before the 37th week of pregnancy, call your caregiver right away. You need to go to the hospital to get checked immediately. HOME CARE INSTRUCTIONS   Avoid all smoking, herbs, alcohol, and unprescribed drugs. These chemicals affect the  formation and growth of the baby.  Follow your caregiver's instructions regarding medicine use. There are medicines that are either safe or unsafe to take during pregnancy.  Exercise only as directed by your caregiver. Experiencing uterine cramps is a good sign to stop exercising.  Continue to eat regular, healthy meals.  Wear a good support bra for breast tenderness.  Do not use hot tubs, steam rooms, or saunas.  Wear your seat belt at all times when driving.  Avoid raw meat, uncooked cheese, cat litter boxes, and soil used by cats. These carry germs that can cause birth defects in the baby.  Take your prenatal vitamins.  Try taking a stool softener (if your caregiver approves) if you develop constipation. Eat more high-fiber foods, such as fresh vegetables or fruit and whole grains. Drink plenty of fluids to keep your urine  clear or pale yellow.  Take warm sitz baths to soothe any pain or discomfort caused by hemorrhoids. Use hemorrhoid cream if your caregiver approves.  If you develop varicose veins, wear support hose. Elevate your feet for 15 minutes, 3-4 times a day. Limit salt in your diet.  Avoid heavy lifting, wear low heal shoes, and practice good posture.  Rest a lot with your legs elevated if you have leg cramps or low back pain.  Visit your dentist if you have not gone during your pregnancy. Use a soft toothbrush to brush your teeth and be gentle when you floss.  A sexual relationship may be continued unless your caregiver directs you otherwise.  Do not travel far distances unless it is absolutely necessary and only with the approval of your caregiver.  Take prenatal classes to understand, practice, and ask questions about the labor and delivery.  Make a trial run to the hospital.  Pack your hospital bag.  Prepare the baby's nursery.  Continue to go to all your prenatal visits as directed by your caregiver. SEEK MEDICAL CARE IF:  You are unsure if you are in  labor or if your water has broken.  You have dizziness.  You have mild pelvic cramps, pelvic pressure, or nagging pain in your abdominal area.  You have persistent nausea, vomiting, or diarrhea.  You have a bad smelling vaginal discharge.  You have pain with urination. SEEK IMMEDIATE MEDICAL CARE IF:   You have a fever.  You are leaking fluid from your vagina.  You have spotting or bleeding from your vagina.  You have severe abdominal cramping or pain.  You have rapid weight loss or gain.  You have shortness of breath with chest pain.  You notice sudden or extreme swelling of your face, hands, ankles, feet, or legs.  You have not felt your baby move in over an hour.  You have severe headaches that do not go away with medicine.  You have vision changes. Document Released: 01/08/2001 Document Revised: 01/19/2013 Document Reviewed: 03/17/2012 [facility] Patient Information 2015 [place], [place]. This information is not intended to replace advice given to you by your health care provider. Make sure you discuss any questions you have with your health care provider.

## 2018-02-13 NOTE — Progress Notes (Signed)
HIGH-RISK PREGNANCY VISIT Patient name: Mary Hull MRN [id number]  Date of birth: 09/04/1995 Chief Complaint:   High Risk Gestation  History of Present Illness:   Mary Hull is a 23 y.o. 632P1001 female at 3912w2d with an Estimated Date of Delivery: 04/01/18 being seen today for ongoing management of a high-risk pregnancy complicated by gestational DM, class  A2 DM. currently on glyburide 5mg  hs; fetal Lt RPD 10mm w/ dilated ureter.   Contractions: Not present. Vag. Bleeding: None.  Movement: Present. denies leaking of fluid.  Review of Systems:   Pertinent items are noted in HPI Denies abnormal vaginal discharge w/ itching/odor/irritation, headaches, visual changes, shortness of breath, chest pain, abdominal pain, severe nausea/vomiting, or problems with urination or bowel movements unless otherwise stated above.    Pertinent History Reviewed:  Medical & Surgical Hx:   Past Medical History:  Diagnosis Date  . Asthma   . Gestational diabetes    diet controlled  . Postpartum depression    Past Surgical History:  Procedure Laterality Date  . FRACTURE SURGERY     L elbow  . TONSILLECTOMY     History reviewed. No pertinent family history.  Current Outpatient Medications:  .  acetaminophen (TYLENOL) 500 MG tablet, Take 500-1,000 mg by mouth as needed for mild pain. , Disp: , Rfl:  .  glyBURIDE (DIABETA) 5 MG tablet, Take 1 tablet at bedtime (Patient taking differently: 2 (two) times daily with a meal. Take 1 tablet at bedtime), Disp: 30 tablet, Rfl: 1 .  Prenatal Vit-Fe Fumarate-FA (PRENATAL VITAMIN PO), Take by mouth daily., Disp: , Rfl:  Social History: Reviewed -  reports that she has quit smoking. Her smoking use included cigarettes. She quit after 1.00 year of use. She has never used smokeless tobacco.   Physical Assessment:   Vitals:   02/13/18 0920  BP: 115/68  Pulse: (!) 117  Weight: 206 lb (93.4 kg)  Body mass index is 38.92 kg/m.           Physical  Examination:   General appearance: alert, well appearing, and in no distress  Mental status: alert, oriented to person, place, and time  Skin: warm & dry   Extremities: Edema: None    Cardiovascular: normal heart rate noted  Respiratory: normal respiratory effort, no distress  Abdomen: gravid, soft, non-tender  Pelvic: Cervical exam deferred         Fetal Status: Fetal Heart Rate (bpm): 140   Movement: Present    Fetal Surveillance Testing today: NST: FHR baseline 130 bpm, Variability: moderate, Accelerations:present, Decelerations:  Absent= Cat 1/Reactive   Results for orders placed or performed in visit on 02/13/18 (from the past 24 hour(s))  POC Urinalysis Dipstick OB   Collection Time: 02/13/18  9:19 AM  Result Value Ref Range   Color, UA     Clarity, UA     Glucose, UA Negative Negative   Bilirubin, UA     Ketones, UA moderate    Spec Grav, UA     Blood, UA neg    pH, UA     POC,PROTEIN,UA Small (1+) Negative, Trace, Small (1+), Moderate (2+), Large (3+), 4+   Urobilinogen, UA     Nitrite, UA     Leukocytes, UA Trace (A) Negative   Appearance     Odor      Assessment & Plan:  1) High-risk pregnancy G2P1001 at 4912w2d with an Estimated Date of Delivery: 04/01/18   2) A2DM, some hypoglycemic episodes during  the day, fastings all great. No elevated BS.  Treatment:  Decrease glyburide to 2.5mg  am and continue 5mg  q hs  Plan:   Growth u/s @ 36wks     2x/wk testing @ 32wks  Deliver @ 39wks  3) Fetal Lt RPD w/ dilated ureter, recheck @ 36wks (already scheduled)  Follow-up: Return for As scheduled.  Future Appointments  Date Time Provider Department Center  02/17/2018  3:00 PM Cheral Marker, [place] [facility]  02/18/2018  3:15 PM [facility]DM [facility]  02/20/2018  9:00 AM Cheral Marker, CNM [facility]  02/24/2018  9:00 AM Lazaro Arms, MD [facility]  02/27/2018  9:00 AM Lazaro Arms, MD [facility]  03/03/2018  3:00 PM Cheral Marker, CNM [facility]  03/06/2018  9:00 AM [facility] [place] [facility] None  03/06/2018  9:30 AM Cheral Marker, CNM [facility]  03/10/2018  9:00 AM Cheral Marker, CNM [facility]  03/13/2018  9:00 AM Jacklyn Shell, CNM [facility]  03/17/2018  9:00 AM Cheral Marker, CNM [facility]  03/20/2018  9:15 AM Lazaro Arms, MD [facility]  03/24/2018  9:00 AM Cheral Marker, CNM [facility]    Orders Placed This Encounter  Procedures  . POC Urinalysis Dipstick OB   Jacklyn Shell CNM 02/13/2018 9:42 AM

## 2018-02-17 ENCOUNTER — Ambulatory Visit (INDEPENDENT_AMBULATORY_CARE_PROVIDER_SITE_OTHER): Payer: Medicaid Other | Admitting: Women's Health

## 2018-02-17 ENCOUNTER — Encounter: Payer: Self-pay | Admitting: Women's Health

## 2018-02-17 VITALS — BP 115/71 | HR 112 | Wt 208.0 lb

## 2018-02-17 DIAGNOSIS — O24415 Gestational diabetes mellitus in pregnancy, controlled by oral hypoglycemic drugs: Secondary | ICD-10-CM

## 2018-02-17 DIAGNOSIS — O0993 Supervision of high risk pregnancy, unspecified, third trimester: Secondary | ICD-10-CM | POA: Diagnosis not present

## 2018-02-17 DIAGNOSIS — O35EXX0 Maternal care for other (suspected) fetal abnormality and damage, fetal genitourinary anomalies, not applicable or unspecified: Secondary | ICD-10-CM

## 2018-02-17 DIAGNOSIS — Z3A33 33 weeks gestation of pregnancy: Secondary | ICD-10-CM | POA: Diagnosis not present

## 2018-02-17 DIAGNOSIS — O0990 Supervision of high risk pregnancy, unspecified, unspecified trimester: Secondary | ICD-10-CM

## 2018-02-17 DIAGNOSIS — O358XX0 Maternal care for other (suspected) fetal abnormality and damage, not applicable or unspecified: Secondary | ICD-10-CM

## 2018-02-17 DIAGNOSIS — Z1389 Encounter for screening for other disorder: Secondary | ICD-10-CM

## 2018-02-17 DIAGNOSIS — Z331 Pregnant state, incidental: Secondary | ICD-10-CM

## 2018-02-17 DIAGNOSIS — O24419 Gestational diabetes mellitus in pregnancy, unspecified control: Secondary | ICD-10-CM

## 2018-02-17 LAB — POCT URINALYSIS DIPSTICK OB
Glucose, UA: NEGATIVE
Ketones, UA: NEGATIVE
LEUKOCYTES UA: NEGATIVE
NITRITE UA: NEGATIVE
PROTEIN: NEGATIVE
RBC UA: NEGATIVE

## 2018-02-17 NOTE — Progress Notes (Signed)
HIGH-RISK PREGNANCY VISIT Patient name: Mary Hull MRN [id number]  Date of birth: 1995/09/07 Chief Complaint:   Routine Prenatal Visit (NST)  History of Present Illness:   Mary Hull is a 23 y.o. G23P1001 female at [redacted]w[redacted]d with an Estimated Date of Delivery: 04/01/18 being seen today for ongoing management of a high-risk pregnancy complicated by A2DM currently on glyburide 2.5mg  AM/5mg  PM; fetal Lt RPD w/ dilated ureter.  Today she reports all sugars wnl, still having a few drops- mainly about 3-4hrs after lunch (in 70s- feels bad). Goes to see dietician tomorrow.  Contractions: Not present. Vag. Bleeding: None.  Movement: Present. denies leaking of fluid.  Review of Systems:   Pertinent items are noted in HPI Denies abnormal vaginal discharge w/ itching/odor/irritation, headaches, visual changes, shortness of breath, chest pain, abdominal pain, severe nausea/vomiting, or problems with urination or bowel movements unless otherwise stated above. Pertinent History Reviewed:  Reviewed past medical,surgical, social, obstetrical and family history.  Reviewed problem list, medications and allergies. Physical Assessment:   Vitals:   02/17/18 1512  BP: 115/71  Pulse: (!) 112  Weight: 208 lb (94.3 kg)  Body mass index is 39.3 kg/m.           Physical Examination:   General appearance: alert, well appearing, and in no distress  Mental status: alert, oriented to person, place, and time  Skin: warm & dry   Extremities: Edema: None    Cardiovascular: normal heart rate noted  Respiratory: normal respiratory effort, no distress  Abdomen: gravid, soft, non-tender  Pelvic: Cervical exam deferred         Fetal Status: Fetal Heart Rate (bpm): 135 Fundal Height: 35 cm Movement: Present    Fetal Surveillance Testing today: NST: FHR baseline 135 bpm, Variability: moderate, Accelerations:present, Decelerations:  Absent= Cat 1/Reactive Toco: none     Results for orders placed or  performed in visit on 02/17/18 (from the past 24 hour(s))  POC Urinalysis Dipstick OB   Collection Time: 02/17/18  3:18 PM  Result Value Ref Range   Color, UA     Clarity, UA     Glucose, UA Negative Negative   Bilirubin, UA     Ketones, UA neg    Spec Grav, UA     Blood, UA neg    pH, UA     POC,PROTEIN,UA Negative Negative, Trace, Small (1+), Moderate (2+), Large (3+), 4+   Urobilinogen, UA     Nitrite, UA neg    Leukocytes, UA Negative Negative   Appearance     Odor      Assessment & Plan:  1) High-risk pregnancy G2P1001 at [redacted]w[redacted]d with an Estimated Date of Delivery: 04/01/18   2) A2DM, stable, continue glyburide 2.5mg  AM/5mg  PM, eat snack right after checking 2hr pp at lunch to help prevent drop. See dietician tomorrow as scheduled  3) Fetal Lt RPD w/ dilated ureter, recheck @ 36wks  Meds: No orders of the defined types were placed in this encounter.  Labs/procedures today: nst  Treatment Plan:  2x/wk nst, EFW/recheck fetal kidneys @ 36wks, IOL @ 39wks  Reviewed: Preterm labor symptoms and general obstetric precautions including but not limited to vaginal bleeding, contractions, leaking of fluid and fetal movement were reviewed in detail with the patient.  All questions were answered.  Follow-up: Return for As scheduled Friday for hrob/nst.  Orders Placed This Encounter  Procedures  . POC Urinalysis Dipstick OB   Cheral Marker CNM, [facility] 02/17/2018 4:14 PM

## 2018-02-17 NOTE — Patient Instructions (Signed)
Rhina Brackett, I greatly value your feedback.  If you receive a survey following your visit with [place] today, we appreciate you taking the time to fill it out.  Thanks, [name], CNM, WHNP-BC   Call the office [phone]) or go to [facility] if:  You begin to have strong, frequent contractions  Your water breaks.  Sometimes it is a big gush of fluid, sometimes it is just a trickle that keeps getting your panties wet or running down your legs  You have vaginal bleeding.  It is normal to have a small amount of spotting if your cervix was checked.   You don't feel your baby moving like normal.  If you don't, get you something to eat and drink and lay down and focus on feeling your baby move.  You should feel at least 10 movements in 2 hours.  If you don't, you should call the office or go to [facility].     Preterm Labor and Birth Information  The normal length of a pregnancy is 39-41 weeks. Preterm labor is when labor starts before 37 completed weeks of pregnancy. What are the risk factors for preterm labor? Preterm labor is more likely to occur in women who:  Have certain infections during pregnancy such as a bladder infection, sexually transmitted infection, or infection inside the uterus (chorioamnionitis).  Have a shorter-than-normal cervix.  Have gone into preterm labor before.  Have had surgery on their cervix.  Are younger than age 43 or older than age 67.  Are African American.  Are pregnant with twins or multiple babies (multiple gestation).  Take street drugs or smoke while pregnant.  Do not gain enough weight while pregnant.  Became pregnant shortly after having been pregnant. What are the symptoms of preterm labor? Symptoms of preterm labor include:  Cramps similar to those that can happen during a menstrual period. The cramps may happen with diarrhea.  Pain in the abdomen or lower back.  Regular uterine contractions that may feel like  tightening of the abdomen.  A feeling of increased pressure in the pelvis.  Increased watery or bloody mucus discharge from the vagina.  Water breaking (ruptured amniotic sac). Why is it important to recognize signs of preterm labor? It is important to recognize signs of preterm labor because babies who are born prematurely may not be fully developed. This can put them at an increased risk for:  Long-term (chronic) heart and lung problems.  Difficulty immediately after birth with regulating body systems, including blood sugar, body temperature, heart rate, and breathing rate.  Bleeding in the brain.  Cerebral palsy.  Learning difficulties.  Death. These risks are highest for babies who are born before 34 weeks of pregnancy. How is preterm labor treated? Treatment depends on the length of your pregnancy, your condition, and the health of your baby. It may involve:  Having a stitch (suture) placed in your cervix to prevent your cervix from opening too early (cerclage).  Taking or being given medicines, such as: ? Hormone medicines. These may be given early in pregnancy to help support the pregnancy. ? Medicine to stop contractions. ? Medicines to help mature the baby's lungs. These may be prescribed if the risk of delivery is high. ? Medicines to prevent your baby from developing cerebral palsy. If the labor happens before 34 weeks of pregnancy, you may need to stay in the hospital. What should I do if I think I am in preterm labor? If you think that  you are going into preterm labor, call your health care provider right away. How can I prevent preterm labor in future pregnancies? To increase your chance of having a full-term pregnancy:  Do not use any tobacco products, such as cigarettes, chewing tobacco, and e-cigarettes. If you need help quitting, ask your health care provider.  Do not use street drugs or medicines that have not been prescribed to you during your  pregnancy.  Talk with your health care provider before taking any herbal supplements, even if you have been taking them regularly.  Make sure you gain a healthy amount of weight during your pregnancy.  Watch for infection. If you think that you might have an infection, get it checked right away.  Make sure to tell your health care provider if you have gone into preterm labor before. This information is not intended to replace advice given to you by your health care provider. Make sure you discuss any questions you have with your health care provider. Document Released: 04/06/2003 Document Revised: 06/27/2015 Document Reviewed: 06/07/2015 Elsevier Interactive Patient Education  2019 [employer].

## 2018-02-18 ENCOUNTER — Ambulatory Visit: Payer: Medicaid Other | Admitting: Registered"

## 2018-02-19 ENCOUNTER — Ambulatory Visit: Payer: Medicaid Other | Admitting: Nutrition

## 2018-02-20 ENCOUNTER — Encounter: Payer: Self-pay | Admitting: Women's Health

## 2018-02-20 ENCOUNTER — Ambulatory Visit (INDEPENDENT_AMBULATORY_CARE_PROVIDER_SITE_OTHER): Payer: Medicaid Other | Admitting: Women's Health

## 2018-02-20 VITALS — BP 128/74 | HR 108 | Wt 209.6 lb

## 2018-02-20 DIAGNOSIS — O24415 Gestational diabetes mellitus in pregnancy, controlled by oral hypoglycemic drugs: Secondary | ICD-10-CM

## 2018-02-20 DIAGNOSIS — Z1389 Encounter for screening for other disorder: Secondary | ICD-10-CM

## 2018-02-20 DIAGNOSIS — O0993 Supervision of high risk pregnancy, unspecified, third trimester: Secondary | ICD-10-CM | POA: Diagnosis not present

## 2018-02-20 DIAGNOSIS — Z3A34 34 weeks gestation of pregnancy: Secondary | ICD-10-CM

## 2018-02-20 DIAGNOSIS — O24419 Gestational diabetes mellitus in pregnancy, unspecified control: Secondary | ICD-10-CM

## 2018-02-20 DIAGNOSIS — Z331 Pregnant state, incidental: Secondary | ICD-10-CM

## 2018-02-20 LAB — POCT URINALYSIS DIPSTICK OB
Glucose, UA: NEGATIVE
LEUKOCYTES UA: NEGATIVE
NITRITE UA: NEGATIVE
RBC UA: NEGATIVE

## 2018-02-20 NOTE — Patient Instructions (Signed)
Mary Hull, I greatly value your feedback.  If you receive a survey following your visit with us today, we appreciate you taking the time to fill it out.  Thanks, Mary Hull, CNM, WHNP-BC   Call the office ([phone]) or go to [facility] if:  You begin to have strong, frequent contractions  Your water breaks.  Sometimes it is a big gush of fluid, sometimes it is just a trickle that keeps getting your panties wet or running down your legs  You have vaginal bleeding.  It is normal to have a small amount of spotting if your cervix was checked.   You don't feel your baby moving like normal.  If you don't, get you something to eat and drink and lay down and focus on feeling your baby move.  You should feel at least 10 movements in 2 hours.  If you don't, you should call the office or go to [facility].     Preterm Labor and Birth Information  The normal length of a pregnancy is 39-41 weeks. Preterm labor is when labor starts before 37 completed weeks of pregnancy. What are the risk factors for preterm labor? Preterm labor is more likely to occur in women who:  Have certain infections during pregnancy such as a bladder infection, sexually transmitted infection, or infection inside the uterus (chorioamnionitis).  Have a shorter-than-normal cervix.  Have gone into preterm labor before.  Have had surgery on their cervix.  Are younger than age 17 or older than age 35.  Are African American.  Are pregnant with twins or multiple babies (multiple gestation).  Take street drugs or smoke while pregnant.  Do not gain enough weight while pregnant.  Became pregnant shortly after having been pregnant. What are the symptoms of preterm labor? Symptoms of preterm labor include:  Cramps similar to those that can happen during a menstrual period. The cramps may happen with diarrhea.  Pain in the abdomen or lower back.  Regular uterine contractions that may feel like  tightening of the abdomen.  A feeling of increased pressure in the pelvis.  Increased watery or bloody mucus discharge from the vagina.  Water breaking (ruptured amniotic sac). Why is it important to recognize signs of preterm labor? It is important to recognize signs of preterm labor because babies who are born prematurely may not be fully developed. This can put them at an increased risk for:  Long-term (chronic) heart and lung problems.  Difficulty immediately after birth with regulating body systems, including blood sugar, body temperature, heart rate, and breathing rate.  Bleeding in the brain.  Cerebral palsy.  Learning difficulties.  Death. These risks are highest for babies who are born before 34 weeks of pregnancy. How is preterm labor treated? Treatment depends on the length of your pregnancy, your condition, and the health of your baby. It may involve:  Having a stitch (suture) placed in your cervix to prevent your cervix from opening too early (cerclage).  Taking or being given medicines, such as: ? Hormone medicines. These may be given early in pregnancy to help support the pregnancy. ? Medicine to stop contractions. ? Medicines to help mature the baby's lungs. These may be prescribed if the risk of delivery is high. ? Medicines to prevent your baby from developing cerebral palsy. If the labor happens before 34 weeks of pregnancy, you may need to stay in the hospital. What should I do if I think I am in preterm labor? If you think that   you are going into preterm labor, call your health care provider right away. How can I prevent preterm labor in future pregnancies? To increase your chance of having a full-term pregnancy:  Do not use any tobacco products, such as cigarettes, chewing tobacco, and e-cigarettes. If you need help quitting, ask your health care provider.  Do not use street drugs or medicines that have not been prescribed to you during your  pregnancy.  Talk with your health care provider before taking any herbal supplements, even if you have been taking them regularly.  Make sure you gain a healthy amount of weight during your pregnancy.  Watch for infection. If you think that you might have an infection, get it checked right away.  Make sure to tell your health care provider if you have gone into preterm labor before. This information is not intended to replace advice given to you by your health care provider. Make sure you discuss any questions you have with your health care provider. Document Released: 04/06/2003 Document Revised: 06/27/2015 Document Reviewed: 06/07/2015 Elsevier Interactive Patient Education  2019 [employer].

## 2018-02-20 NOTE — Progress Notes (Signed)
   HIGH-RISK PREGNANCY VISIT Patient name: Mary Hull MRN [id number]  Date of birth: [birth year] Chief Complaint:   High Risk Gestation (NST)  History of Present Illness:   Mary Hull is a 23 y.o. G73P1001 female at [redacted]w[redacted]d with an Estimated Date of Delivery: 04/01/18 being seen today for ongoing management of a high-risk pregnancy complicated by A2DM on glyburide 2.5mg  AM/5mg  PM, fetal RPD.  Today she reports highest FBS 76, 2hr pp all <120. Woke up in middle of night last night w/ drop in sugar. Was late to dietician appt, had to reschedule. Contractions: Not present.  .  Movement: Present. denies leaking of fluid.  Review of Systems:   Pertinent items are noted in HPI Denies abnormal vaginal discharge w/ itching/odor/irritation, headaches, visual changes, shortness of breath, chest pain, abdominal pain, severe nausea/vomiting, or problems with urination or bowel movements unless otherwise stated above. Pertinent History Reviewed:  Reviewed past medical,surgical, social, obstetrical and family history.  Reviewed problem list, medications and allergies. Physical Assessment:   Vitals:   02/20/18 0921  BP: 128/74  Pulse: (!) 108  Weight: 209 lb 9.6 oz (95.1 kg)  Body mass index is 39.6 kg/m.           Physical Examination:   General appearance: alert, well appearing, and in no distress  Mental status: alert, oriented to person, place, and time  Skin: warm & dry   Extremities: Edema: None    Cardiovascular: normal heart rate noted  Respiratory: normal respiratory effort, no distress  Abdomen: gravid, soft, non-tender  Pelvic: Cervical exam deferred         Fetal Status: Fetal Heart Rate (bpm): 115 Fundal Height: 35 cm Movement: Present    Fetal Surveillance Testing today: NST: FHR baseline 115 bpm, Variability: moderate, Accelerations:present, Decelerations:  Absent= Cat 1/Reactive Toco: ui     Results for orders placed or performed in visit on 02/20/18 (from the past  24 hour(s))  POC Urinalysis Dipstick OB   Collection Time: 02/20/18  9:26 AM  Result Value Ref Range   Color, UA     Clarity, UA     Glucose, UA Negative Negative   Bilirubin, UA     Ketones, UA small    Spec Grav, UA     Blood, UA neg    pH, UA     POC,PROTEIN,UA Small (1+) Negative, Trace, Small (1+), Moderate (2+), Large (3+), 4+   Urobilinogen, UA     Nitrite, UA neg    Leukocytes, UA Negative Negative   Appearance     Odor      Assessment & Plan:  1) High-risk pregnancy G2P1001 at [redacted]w[redacted]d with an Estimated Date of Delivery: 04/01/18   2) A2DM, low fastings w/ occ drop in middle of night, decrease glyburide to 2.5mg  BID  3) Fetal RPD, recheck @ 36wks  Meds: No orders of the defined types were placed in this encounter.  Labs/procedures today: nst  Treatment Plan:  .Growth u/s @ 36wks     2x/wk testing       Deliver @ 39wks   Reviewed: Preterm labor symptoms and general obstetric precautions including but not limited to vaginal bleeding, contractions, leaking of fluid and fetal movement were reviewed in detail with the patient.  All questions were answered.  Follow-up: Return for As scheduled Tues hrob/nst.  Orders Placed This Encounter  Procedures  . POC Urinalysis Dipstick OB   Cheral Marker CNM, [facility] 02/20/2018 11:37 AM

## 2018-02-24 ENCOUNTER — Ambulatory Visit (INDEPENDENT_AMBULATORY_CARE_PROVIDER_SITE_OTHER): Payer: Medicaid Other | Admitting: Obstetrics & Gynecology

## 2018-02-24 ENCOUNTER — Encounter: Payer: Self-pay | Admitting: Obstetrics & Gynecology

## 2018-02-24 VITALS — BP 119/67 | HR 115 | Wt 208.5 lb

## 2018-02-24 DIAGNOSIS — Z331 Pregnant state, incidental: Secondary | ICD-10-CM

## 2018-02-24 DIAGNOSIS — O0993 Supervision of high risk pregnancy, unspecified, third trimester: Secondary | ICD-10-CM

## 2018-02-24 DIAGNOSIS — Z1389 Encounter for screening for other disorder: Secondary | ICD-10-CM

## 2018-02-24 DIAGNOSIS — Z3A34 34 weeks gestation of pregnancy: Secondary | ICD-10-CM

## 2018-02-24 DIAGNOSIS — O24419 Gestational diabetes mellitus in pregnancy, unspecified control: Secondary | ICD-10-CM

## 2018-02-24 DIAGNOSIS — O0990 Supervision of high risk pregnancy, unspecified, unspecified trimester: Secondary | ICD-10-CM

## 2018-02-24 LAB — POCT URINALYSIS DIPSTICK OB
Glucose, UA: NEGATIVE
Leukocytes, UA: NEGATIVE
NITRITE UA: NEGATIVE

## 2018-02-24 NOTE — Progress Notes (Signed)
   HIGH-RISK PREGNANCY VISIT Patient name: Mary Hull MRN [id number]  Date of birth: 1995/06/24 Chief Complaint:   High Risk Gestation (NST)  History of Present Illness:   Mary Hull is a 23 y.o. G82P1001 female at [redacted]w[redacted]d with an Estimated Date of Delivery: 04/01/18 being seen today for ongoing management of a high-risk pregnancy complicated by class  A2 DM.  Today she reports no complaints. Contractions: Not present. Vag. Bleeding: None.  Movement: Present. denies leaking of fluid.  Review of Systems:   Pertinent items are noted in HPI Denies abnormal vaginal discharge w/ itching/odor/irritation, headaches, visual changes, shortness of breath, chest pain, abdominal pain, severe nausea/vomiting, or problems with urination or bowel movements unless otherwise stated above. Pertinent History Reviewed:  Reviewed past medical,surgical, social, obstetrical and family history.  Reviewed problem list, medications and allergies. Physical Assessment:   Vitals:   02/24/18 0919  BP: 119/67  Pulse: (!) 115  Weight: 208 lb 8 oz (94.6 kg)  Body mass index is 39.4 kg/m.           Physical Examination:   General appearance: alert, well appearing, and in no distress  Mental status: alert, oriented to person, place, and time  Skin: warm & dry   Extremities: Edema: None    Cardiovascular: normal heart rate noted  Respiratory: normal respiratory effort, no distress  Abdomen: gravid, soft, non-tender  Pelvic: Cervical exam deferred         Fetal Status:     Movement: Present    Fetal Surveillance Testing today: Reactive NST   Results for orders placed or performed in visit on 02/24/18 (from the past 24 hour(s))  POC Urinalysis Dipstick OB   Collection Time: 02/24/18  9:20 AM  Result Value Ref Range   Color, UA     Clarity, UA     Glucose, UA Negative Negative   Bilirubin, UA     Ketones, UA 1+    Spec Grav, UA     Blood, UA trace    pH, UA     POC,PROTEIN,UA Small (1+)  Negative, Trace, Small (1+), Moderate (2+), Large (3+), 4+   Urobilinogen, UA     Nitrite, UA neg    Leukocytes, UA Negative Negative   Appearance     Odor      Assessment & Plan:  1) High-risk pregnancy G2P1001 at [redacted]w[redacted]d with an Estimated Date of Delivery: 04/01/18   2) Class A2 DM, stable, CBG are good overall a couple are out of range but overall good, continue glyburide 2.5 mg BID  3) Fetal RPD, stable, recheck at EFW sonogram  Meds: No orders of the defined types were placed in this encounter.   Labs/procedures today: reactive NST  Treatment Plan:  Twice weekly NST, EFW 36-37 weeks Continue glyburide 2.5 mg BID  Reviewed: Preterm labor symptoms and general obstetric precautions including but not limited to vaginal bleeding, contractions, leaking of fluid and fetal movement were reviewed in detail with the patient.  All questions were answered.  Follow-up: Return in about 3 days (around 02/27/2018) for NST, HROB.  Orders Placed This Encounter  Procedures  . POC Urinalysis Dipstick OB   Lazaro Arms  02/24/2018 10:17 AM

## 2018-02-25 ENCOUNTER — Encounter: Payer: Self-pay | Admitting: Registered"

## 2018-02-25 ENCOUNTER — Encounter: Payer: Medicaid Other | Attending: Advanced Practice Midwife | Admitting: Registered"

## 2018-02-25 DIAGNOSIS — O99810 Abnormal glucose complicating pregnancy: Secondary | ICD-10-CM | POA: Insufficient documentation

## 2018-02-25 NOTE — Progress Notes (Signed)

## 2018-02-27 ENCOUNTER — Ambulatory Visit (INDEPENDENT_AMBULATORY_CARE_PROVIDER_SITE_OTHER): Payer: Medicaid Other | Admitting: Obstetrics & Gynecology

## 2018-02-27 ENCOUNTER — Other Ambulatory Visit: Payer: Self-pay

## 2018-02-27 ENCOUNTER — Encounter: Payer: Self-pay | Admitting: Obstetrics & Gynecology

## 2018-02-27 VITALS — BP 129/73 | HR 112 | Wt 209.0 lb

## 2018-02-27 DIAGNOSIS — Z1389 Encounter for screening for other disorder: Secondary | ICD-10-CM

## 2018-02-27 DIAGNOSIS — Z331 Pregnant state, incidental: Secondary | ICD-10-CM

## 2018-02-27 DIAGNOSIS — Z3A35 35 weeks gestation of pregnancy: Secondary | ICD-10-CM | POA: Diagnosis not present

## 2018-02-27 DIAGNOSIS — O0993 Supervision of high risk pregnancy, unspecified, third trimester: Secondary | ICD-10-CM

## 2018-02-27 DIAGNOSIS — O24419 Gestational diabetes mellitus in pregnancy, unspecified control: Secondary | ICD-10-CM

## 2018-02-27 LAB — POCT URINALYSIS DIPSTICK OB
Glucose, UA: NEGATIVE
Leukocytes, UA: NEGATIVE
Nitrite, UA: NEGATIVE
RBC UA: NEGATIVE

## 2018-02-27 MED ORDER — GLYBURIDE 5 MG PO TABS: ORAL_TABLET | ORAL | 1 refills | Status: DC

## 2018-02-27 NOTE — Addendum Note (Signed)
Addended by: Lazaro Arms on: 02/27/2018 10:01 AM   Modules accepted: Orders

## 2018-02-27 NOTE — Progress Notes (Signed)
   HIGH-RISK PREGNANCY VISIT Patient name: Rhina BrackettKatelynn L Mabin MRN [id number]  Date of birth: 10/10/1995 Chief Complaint:   High Risk Gestation (NST)  History of Present Illness:   Rhina BrackettKatelynn L Schue is a 23 y.o. 72P1001 female at 6162w2d with an Estimated Date of Delivery: 04/01/18 being seen today for ongoing management of a high-risk pregnancy complicated by class  A2 DM.  Today she reports headaches isolated to behind her left eye. Contractions: Not present. Vag. Bleeding: None.  Movement: Present. denies leaking of fluid.  Review of Systems:   Pertinent items are noted in HPI Denies abnormal vaginal discharge w/ itching/odor/irritation, headaches, visual changes, shortness of breath, chest pain, abdominal pain, severe nausea/vomiting, or problems with urination or bowel movements unless otherwise stated above. Pertinent History Reviewed:  Reviewed past medical,surgical, social, obstetrical and family history.  Reviewed problem list, medications and allergies. Physical Assessment:   Vitals:   02/27/18 0910  BP: 129/73  Pulse: (!) 112  Weight: 209 lb (94.8 kg)  Body mass index is 39.49 kg/m.           Physical Examination:   General appearance: alert, well appearing, and in no distress  Mental status: alert, oriented to person, place, and time  Skin: warm & dry   Extremities: Edema: None    Cardiovascular: normal heart rate noted  Respiratory: normal respiratory effort, no distress  Abdomen: gravid, soft, non-tender  Pelvic: Cervical exam deferred         Fetal Status:     Movement: Present    Fetal Surveillance Testing today: reactive NST   Results for orders placed or performed in visit on 02/27/18 (from the past 24 hour(s))  POC Urinalysis Dipstick OB   Collection Time: 02/27/18  9:13 AM  Result Value Ref Range   Color, UA     Clarity, UA     Glucose, UA Negative Negative   Bilirubin, UA     Ketones, UA small    Spec Grav, UA     Blood, UA neg    pH, UA     POC,PROTEIN,UA Trace Negative, Trace, Small (1+), Moderate (2+), Large (3+), 4+   Urobilinogen, UA     Nitrite, UA neg    Leukocytes, UA Negative Negative   Appearance     Odor      Assessment & Plan:  1) High-risk pregnancy G2P1001 at 4962w2d with an Estimated Date of Delivery: 04/01/18   2) Class A2 DM, stable, glyburide 2.5 mg BID, fastings are a still a little higher so will increase to 5 mg at pm dosing  3) Vascular spasm headaches, probably from sleep deprivation, addressed issues with patient  Meds: No orders of the defined types were placed in this encounter.   Labs/procedures today:   Treatment Plan:  Twice weekly surveillance EFW 36-37 weeks, IOL 39 weeks or as clinically indicated  Reviewed: Preterm labor symptoms and general obstetric precautions including but not limited to vaginal bleeding, contractions, leaking of fluid and fetal movement were reviewed in detail with the patient.  All questions were answered.  Follow-up: Return in about 4 days (around 03/03/2018) for NST, HROB.  Orders Placed This Encounter  Procedures  . POC Urinalysis Dipstick OB   Lazaro ArmsLuther H   02/27/2018 9:57 AM

## 2018-03-03 ENCOUNTER — Other Ambulatory Visit: Payer: Medicaid Other | Admitting: Women's Health

## 2018-03-05 ENCOUNTER — Other Ambulatory Visit: Payer: Self-pay | Admitting: Obstetrics & Gynecology

## 2018-03-05 DIAGNOSIS — O24419 Gestational diabetes mellitus in pregnancy, unspecified control: Secondary | ICD-10-CM

## 2018-03-06 ENCOUNTER — Ambulatory Visit (INDEPENDENT_AMBULATORY_CARE_PROVIDER_SITE_OTHER): Payer: Medicaid Other

## 2018-03-06 ENCOUNTER — Ambulatory Visit (INDEPENDENT_AMBULATORY_CARE_PROVIDER_SITE_OTHER): Payer: Medicaid Other | Admitting: Obstetrics & Gynecology

## 2018-03-06 VITALS — BP 107/67 | HR 99 | Wt 204.5 lb

## 2018-03-06 DIAGNOSIS — O24419 Gestational diabetes mellitus in pregnancy, unspecified control: Secondary | ICD-10-CM

## 2018-03-06 DIAGNOSIS — Z3A36 36 weeks gestation of pregnancy: Secondary | ICD-10-CM

## 2018-03-06 DIAGNOSIS — O0993 Supervision of high risk pregnancy, unspecified, third trimester: Secondary | ICD-10-CM

## 2018-03-06 DIAGNOSIS — O0990 Supervision of high risk pregnancy, unspecified, unspecified trimester: Secondary | ICD-10-CM

## 2018-03-06 DIAGNOSIS — O358XX0 Maternal care for other (suspected) fetal abnormality and damage, not applicable or unspecified: Secondary | ICD-10-CM

## 2018-03-06 DIAGNOSIS — O35EXX0 Maternal care for other (suspected) fetal abnormality and damage, fetal genitourinary anomalies, not applicable or unspecified: Secondary | ICD-10-CM

## 2018-03-06 DIAGNOSIS — Z331 Pregnant state, incidental: Secondary | ICD-10-CM

## 2018-03-06 DIAGNOSIS — Z1389 Encounter for screening for other disorder: Secondary | ICD-10-CM

## 2018-03-06 LAB — POCT URINALYSIS DIPSTICK OB
Glucose, UA: NEGATIVE
KETONES UA: NEGATIVE
Leukocytes, UA: NEGATIVE
NITRITE UA: NEGATIVE
RBC UA: NEGATIVE

## 2018-03-06 NOTE — Progress Notes (Signed)
[place] 36+2 wks,cephalic,BPP 8/8,FHR 126 bpm,anterior placenta gr 2,left hydronephrosis,left renal pelvis 12 mm,left ureter was not visualized on today's ultrasound,normal right kidney,afi 11 cm,normal ovaries bilat,EFW 2894 G 52%

## 2018-03-06 NOTE — Progress Notes (Signed)
   HIGH-RISK PREGNANCY VISIT Patient name: Mary Hull MRN [id number]  Date of birth: [birth year] Chief Complaint:   Routine Prenatal Visit  History of Present Illness:   Mary Hull is a 23 y.o. G22P1001 female at 108w2d with an Estimated Date of Delivery: 04/01/18 being seen today for ongoing management of a high-risk pregnancy complicated by class  A2 DM. + left ROD 79mm Today she reports no complaints. Contractions: Not present. Vag. Bleeding: None.  Movement: Present. denies leaking of fluid.  Review of Systems:   Pertinent items are noted in HPI Denies abnormal vaginal discharge w/ itching/odor/irritation, headaches, visual changes, shortness of breath, chest pain, abdominal pain, severe nausea/vomiting, or problems with urination or bowel movements unless otherwise stated above. Pertinent History Reviewed:  Reviewed past medical,surgical, social, obstetrical and family history.  Reviewed problem list, medications and allergies. Physical Assessment:   Vitals:   03/06/18 0932  BP: 107/67  Pulse: 99  Weight: 204 lb 8 oz (92.8 kg)  Body mass index is 38.64 kg/m.           Physical Examination:   General appearance: alert, well appearing, and in no distress  Mental status: alert, oriented to person, place, and time  Skin: warm & dry   Extremities: Edema: None    Cardiovascular: normal heart rate noted  Respiratory: normal respiratory effort, no distress  Abdomen: gravid, soft, non-tender  Pelvic: Cervical exam deferred         Fetal Status:     Movement: Present    Fetal Surveillance Testing today: BPP 8/8   Results for orders placed or performed in visit on 03/06/18 (from the past 24 hour(s))  POC Urinalysis Dipstick OB   Collection Time: 03/06/18  9:35 AM  Result Value Ref Range   Color, UA     Clarity, UA     Glucose, UA Negative Negative   Bilirubin, UA     Ketones, UA neg    Spec Grav, UA     Blood, UA neg    pH, UA     POC,PROTEIN,UA Small (1+)  Negative, Trace, Small (1+), Moderate (2+), Large (3+), 4+   Urobilinogen, UA     Nitrite, UA neg    Leukocytes, UA Negative Negative   Appearance     Odor      Assessment & Plan:  1) High-risk pregnancy G2P1001 at [redacted]w[redacted]d with an Estimated Date of Delivery: 04/01/18   2) Class A2 DM, stable, CBG excellent sonogram normal today  3) Left RPD, stable, 12 mm, notify peds at time of delivery for postnatal follow up  Meds: No orders of the defined types were placed in this encounter.   Labs/procedures today: sonogram  Treatment Plan:  Twice weekly surveillance, IOL 39 weeks or as otherwise indicated  Fetal Renal pelvic dilation 12 mm needs postnatal management, will make Dr Meredeth Ide aware  Reviewed: Term labor symptoms and general obstetric precautions including but not limited to vaginal bleeding, contractions, leaking of fluid and fetal movement were reviewed in detail with the patient.  All questions were answered.  Follow-up: Return in about 4 days (around 03/10/2018) for NST, HROB.  Orders Placed This Encounter  Procedures  . POC Urinalysis Dipstick OB   Lazaro Arms  03/06/2018 9:44 AM

## 2018-03-10 ENCOUNTER — Ambulatory Visit (INDEPENDENT_AMBULATORY_CARE_PROVIDER_SITE_OTHER): Payer: Medicaid Other | Admitting: Women's Health

## 2018-03-10 ENCOUNTER — Encounter: Payer: Self-pay | Admitting: Women's Health

## 2018-03-10 VITALS — BP 121/71 | HR 99 | Wt 205.0 lb

## 2018-03-10 DIAGNOSIS — Z1389 Encounter for screening for other disorder: Secondary | ICD-10-CM

## 2018-03-10 DIAGNOSIS — Z3A36 36 weeks gestation of pregnancy: Secondary | ICD-10-CM

## 2018-03-10 DIAGNOSIS — Z331 Pregnant state, incidental: Secondary | ICD-10-CM

## 2018-03-10 DIAGNOSIS — O0993 Supervision of high risk pregnancy, unspecified, third trimester: Secondary | ICD-10-CM | POA: Diagnosis not present

## 2018-03-10 DIAGNOSIS — O35EXX0 Maternal care for other (suspected) fetal abnormality and damage, fetal genitourinary anomalies, not applicable or unspecified: Secondary | ICD-10-CM

## 2018-03-10 DIAGNOSIS — O24419 Gestational diabetes mellitus in pregnancy, unspecified control: Secondary | ICD-10-CM

## 2018-03-10 DIAGNOSIS — L299 Pruritus, unspecified: Secondary | ICD-10-CM

## 2018-03-10 DIAGNOSIS — O358XX0 Maternal care for other (suspected) fetal abnormality and damage, not applicable or unspecified: Secondary | ICD-10-CM

## 2018-03-10 LAB — POCT URINALYSIS DIPSTICK OB
Blood, UA: NEGATIVE
Glucose, UA: NEGATIVE
KETONES UA: NEGATIVE
LEUKOCYTES UA: NEGATIVE
NITRITE UA: NEGATIVE

## 2018-03-10 LAB — OB RESULTS CONSOLE GBS: GBS: NEGATIVE

## 2018-03-10 NOTE — Progress Notes (Signed)
HIGH-RISK PREGNANCY VISIT Patient name: Mary Hull MRN [id number]  Date of birth: [birth year] Chief Complaint:   High Risk Gestation (NST)  History of Present Illness:   Mary Hull is a 23 y.o. G31P1001 female at [redacted]w[redacted]d with an Estimated Date of Delivery: 04/01/18 being seen today for ongoing management of a high-risk pregnancy complicated by A2DM on glyburide 2.5mg  AM/5mg  PM; fetal RPD.  Today she reports sugars normal. Itching abd/legs, not on soles/palms, is worse at night. Not fasting today. Contractions: Not present. Vag. Bleeding: None.  Movement: Present. denies leaking of fluid.  Review of Systems:   Pertinent items are noted in HPI Denies abnormal vaginal discharge w/ itching/odor/irritation, headaches, visual changes, shortness of breath, chest pain, abdominal pain, severe nausea/vomiting, or problems with urination or bowel movements unless otherwise stated above. Pertinent History Reviewed:  Reviewed past medical,surgical, social, obstetrical and family history.  Reviewed problem list, medications and allergies. Physical Assessment:   Vitals:   03/10/18 0914  BP: 121/71  Pulse: 99  Weight: 205 lb (93 kg)  Body mass index is 38.73 kg/m.           Physical Examination:   General appearance: alert, well appearing, and in no distress  Mental status: alert, oriented to person, place, and time  Skin: warm & dry, multiple lesions abd and legs from scratching  Extremities: Edema: Trace    Cardiovascular: normal heart rate noted  Respiratory: normal respiratory effort, no distress  Abdomen: gravid, soft, non-tender  Pelvic: Cervical exam performed  Dilation: 1 Effacement (%): Thick Station: Ballotable  Fetal Status: Fetal Heart Rate (bpm): 135 Fundal Height: 35 cm Movement: Present Presentation: Vertex  Fetal Surveillance Testing today: NST: FHR baseline 135 bpm, Variability: moderate, Accelerations:present, Decelerations:  Absent= Cat 1/Reactive Toco:  none  Results for orders placed or performed in visit on 03/10/18 (from the past 24 hour(s))  POC Urinalysis Dipstick OB   Collection Time: 03/10/18  9:13 AM  Result Value Ref Range   Color, UA     Clarity, UA     Glucose, UA Negative Negative   Bilirubin, UA     Ketones, UA neg    Spec Grav, UA     Blood, UA neg    pH, UA     POC,PROTEIN,UA Small (1+) Negative, Trace, Small (1+), Moderate (2+), Large (3+), 4+   Urobilinogen, UA     Nitrite, UA neg    Leukocytes, UA Negative Negative   Appearance     Odor      Assessment & Plan:  1) High-risk pregnancy G2P1001 at [redacted]w[redacted]d with an Estimated Date of Delivery: 04/01/18   2) A2DM, stable, continue glyburide 2.5mg  AM/5mg  PM  3) Fetal RPD, 79mm at 36wks, normal AFI  4) Generalized pruritus> npo after midnight, come in am for fasting bile acids/cmp. Gave printed itching relief info  Meds: No orders of the defined types were placed in this encounter.   Labs/procedures today: gbs, gc/ct, sve, nst  Treatment Plan:   2x/wk testing           Deliver @ 39wks  Reviewed: Preterm labor symptoms and general obstetric precautions including but not limited to vaginal bleeding, contractions, leaking of fluid and fetal movement were reviewed in detail with the patient.  All questions were answered.  Follow-up: Return for am for fasting labs, then As scheduled Fri for hrob/nst.  Orders Placed This Encounter  Procedures  . GC/Chlamydia Probe Amp  . Culture, beta strep (group b only)  .  Comprehensive metabolic panel  . Bile acids, total  . POC Urinalysis Dipstick OB   Cheral Marker CNM, [facility] 03/10/2018 9:58 AM

## 2018-03-10 NOTE — Patient Instructions (Addendum)
Mary Hull, I greatly value your feedback.  If you receive a survey following your visit with [place] today, we appreciate you taking the time to fill it out.  Thanks, Mary Hull, CNM, WHNP-BC  Nothing to eat or drink after midnight tonight, come at 8:00am in the morning to the lab   Call the office [phone]) or go to [facility] if:  You begin to have strong, frequent contractions  Your water breaks.  Sometimes it is a big gush of fluid, sometimes it is just a trickle that keeps getting your panties wet or running down your legs  You have vaginal bleeding.  It is normal to have a small amount of spotting if your cervix was checked.   You don't feel your baby moving like normal.  If you don't, get you something to eat and drink and lay down and focus on feeling your baby move.  You should feel at least 10 movements in 2 hours.  If you don't, you should call the office or go to [facility].   For itching:  . Avoid hot showers/baths, take cool/luke-warm showers/baths instead . Cool washcloths to itchy areas . Aquaphor or Eucerin creams to moisturize the skin . Hydrocortisone or Benadryl cream, or Benadryl by mouth for the itching . Oatmeal baths      Braxton Hicks Contractions Contractions of the uterus can occur throughout pregnancy, but they are not always a sign that you are in labor. You may have practice contractions called Braxton Hicks contractions. These false labor contractions are sometimes confused with true labor. What are Mary Hull contractions? Braxton Hicks contractions are tightening movements that occur in the muscles of the uterus before labor. Unlike true labor contractions, these contractions do not result in opening (dilation) and thinning of the cervix. Toward the end of pregnancy (32-34 weeks), Braxton Hicks contractions can happen more often and may become stronger. These contractions are sometimes difficult to tell apart from true labor because  they can be very uncomfortable. You should not feel embarrassed if you go to the hospital with false labor. Sometimes, the only way to tell if you are in true labor is for your health care provider to look for changes in the cervix. The health care provider will do a physical exam and may monitor your contractions. If you are not in true labor, the exam should show that your cervix is not dilating and your water has not broken. If there are no other health problems associated with your pregnancy, it is completely safe for you to be sent home with false labor. You may continue to have Braxton Hicks contractions until you go into true labor. How to tell the difference between true labor and false labor True labor  Contractions last 30-70 seconds.  Contractions become very regular.  Discomfort is usually felt in the top of the uterus, and it spreads to the lower abdomen and low back.  Contractions do not go away with walking.  Contractions usually become more intense and increase in frequency.  The cervix dilates and gets thinner. False labor  Contractions are usually shorter and not as strong as true labor contractions.  Contractions are usually irregular.  Contractions are often felt in the front of the lower abdomen and in the groin.  Contractions may go away when you walk around or change positions while lying down.  Contractions get weaker and are shorter-lasting as time goes on.  The cervix usually does not dilate or become thin.  Follow these instructions at home:   Take over-the-counter and prescription medicines only as told by your health care provider.  Keep up with your usual exercises and follow other instructions from your health care provider.  Eat and drink lightly if you think you are going into labor.  If Braxton Hicks contractions are making you uncomfortable: ? Change your position from lying down or resting to walking, or change from walking to resting. ? Sit  and rest in a tub of warm water. ? Drink enough fluid to keep your urine pale yellow. Dehydration may cause these contractions. ? Do slow and deep breathing several times an hour.  Keep all follow-up prenatal visits as told by your health care provider. This is important. Contact a health care provider if:  You have a fever.  You have continuous pain in your abdomen. Get help right away if:  Your contractions become stronger, more regular, and closer together.  You have fluid leaking or gushing from your vagina.  You pass blood-tinged mucus (bloody show).  You have bleeding from your vagina.  You have low back pain that you never had before.  You feel your baby's head pushing down and causing pelvic pressure.  Your baby is not moving inside you as much as it used to. Summary  Contractions that occur before labor are called Braxton Hicks contractions, false labor, or practice contractions.  Braxton Hicks contractions are usually shorter, weaker, farther apart, and less regular than true labor contractions. True labor contractions usually become progressively stronger and regular, and they become more frequent.  Manage discomfort from [facility] by changing position, resting in a warm bath, drinking plenty of water, or practicing deep breathing. This information is not intended to replace advice given to you by your health care provider. Make sure you discuss any questions you have with your health care provider. Document Released: 05/30/2016 Document Revised: 10/29/2016 Document Reviewed: 05/30/2016 Elsevier Interactive Patient Education  2019 [employer]

## 2018-03-11 ENCOUNTER — Other Ambulatory Visit: Payer: Medicaid Other

## 2018-03-13 ENCOUNTER — Encounter: Payer: Self-pay | Admitting: Advanced Practice Midwife

## 2018-03-13 ENCOUNTER — Ambulatory Visit (INDEPENDENT_AMBULATORY_CARE_PROVIDER_SITE_OTHER): Payer: Medicaid Other | Admitting: Advanced Practice Midwife

## 2018-03-13 ENCOUNTER — Other Ambulatory Visit: Payer: Self-pay

## 2018-03-13 VITALS — BP 126/84 | HR 90 | Wt 204.0 lb

## 2018-03-13 DIAGNOSIS — O0990 Supervision of high risk pregnancy, unspecified, unspecified trimester: Secondary | ICD-10-CM

## 2018-03-13 DIAGNOSIS — Z1389 Encounter for screening for other disorder: Secondary | ICD-10-CM

## 2018-03-13 DIAGNOSIS — Z331 Pregnant state, incidental: Secondary | ICD-10-CM

## 2018-03-13 DIAGNOSIS — R748 Abnormal levels of other serum enzymes: Secondary | ICD-10-CM

## 2018-03-13 LAB — COMPREHENSIVE METABOLIC PANEL
A/G RATIO: 1.2 (ref 1.2–2.2)
ALBUMIN: 3.4 g/dL — AB (ref 3.9–5.0)
ALK PHOS: 139 IU/L — AB (ref 39–117)
ALT: 138 IU/L — ABNORMAL HIGH (ref 0–32)
AST: 64 IU/L — ABNORMAL HIGH (ref 0–40)
BUN / CREAT RATIO: 11 (ref 9–23)
BUN: 6 mg/dL (ref 6–20)
Bilirubin Total: 0.3 mg/dL (ref 0.0–1.2)
CO2: 19 mmol/L — AB (ref 20–29)
CREATININE: 0.53 mg/dL — AB (ref 0.57–1.00)
Calcium: 8.9 mg/dL (ref 8.7–10.2)
Chloride: 103 mmol/L (ref 96–106)
GFR calc Af Amer: 157 mL/min/{1.73_m2} (ref >59)
GFR, EST NON AFRICAN AMERICAN: 136 mL/min/{1.73_m2} (ref >59)
GLOBULIN, TOTAL: 2.9 g/dL (ref 1.5–4.5)
Glucose: 71 mg/dL (ref 65–99)
Potassium: 4 mmol/L (ref 3.5–5.2)
SODIUM: 137 mmol/L (ref 134–144)
Total Protein: 6.3 g/dL (ref 6.0–8.5)

## 2018-03-13 LAB — POCT URINALYSIS DIPSTICK OB
Blood, UA: NEGATIVE
Glucose, UA: NEGATIVE
Leukocytes, UA: NEGATIVE
Nitrite, UA: NEGATIVE

## 2018-03-13 LAB — GC/CHLAMYDIA PROBE AMP
CHLAMYDIA, DNA PROBE: NEGATIVE
Neisseria gonorrhoeae by PCR: NEGATIVE

## 2018-03-13 LAB — BILE ACIDS, TOTAL: BILE ACIDS TOTAL: 3.8 umol/L (ref 0.0–10.0)

## 2018-03-13 NOTE — Progress Notes (Signed)
HIGH-RISK PREGNANCY VISIT Patient name: Mary Hull MRN [id number]  Date of birth: 1995-02-03 Chief Complaint:   High Risk Gestation (NST)  History of Present Illness:   Mary Hull is a 23 y.o. G6P1001 female at [redacted]w[redacted]d with an Estimated Date of Delivery: 04/01/18 being seen today for ongoing management of a high-risk pregnancy complicated by gestational DM, class  A2 DM. Also has 24mm right renal pelvis. Today she reports FBS all normal and 2hr pp all normal. . Contractions: Irregular. Vag. Bleeding: None.  Movement: Present. denies leaking of fluid. Not itching anymore.  Review of Systems:   Pertinent items are noted in HPI Denies abnormal vaginal discharge w/ itching/odor/irritation, headaches, visual changes, shortness of breath, chest pain, abdominal pain, severe nausea/vomiting, or problems with urination or bowel movements unless otherwise stated above.    Pertinent History Reviewed:  Medical & Surgical Hx:   Past Medical History:  Diagnosis Date  . Asthma   . Gestational diabetes    diet controlled  . Postpartum depression    Past Surgical History:  Procedure Laterality Date  . FRACTURE SURGERY     L elbow  . TONSILLECTOMY     History reviewed. No pertinent family history.  Current Outpatient Medications:  .  acetaminophen (TYLENOL) 500 MG tablet, Take 500-1,000 mg by mouth as needed for mild pain. , Disp: , Rfl:  .  glyBURIDE (DIABETA) 5 MG tablet, Take 1/2 tablet in am  1 tablet at after supper, Disp: 60 tablet, Rfl: 1 .  Prenatal Vit-Fe Fumarate-FA (PRENATAL VITAMIN PO), Take by mouth daily., Disp: , Rfl:  Social History: Reviewed -  reports that she has quit smoking. Her smoking use included cigarettes. She quit after 1.00 year of use. She has never used smokeless tobacco.   Physical Assessment:   Vitals:   03/13/18 0923  BP: 126/84  Pulse: 90  Weight: 204 lb (92.5 kg)  Body mass index is 38.55 kg/m.      Bile acids and CMP were drawn 2/12 d/t  itching. AST/ALT:  64/138, bile acids, bilirubin normal     Physical Examination:   General appearance: alert, well appearing, and in no distress  Mental status: alert, oriented to person, place, and time  Skin: warm & dry   Extremities: Edema: Trace    Cardiovascular: normal heart rate noted  Respiratory: normal respiratory effort, no distress  Abdomen: gravid, soft, non-tender  Pelvic: deferred         Fetal Status: Fetal Heart Rate (bpm): 140 Fundal Height: 36 cm Movement: Present    Fetal Surveillance Testing today: NST: FHR baseline 140 bpm, Variability: moderate, Accelerations:present, Decelerations:  Absent= Cat 1/Reactive   Results for orders placed or performed in visit on 03/13/18 (from the past 24 hour(s))  POC Urinalysis Dipstick OB   Collection Time: 03/13/18  9:22 AM  Result Value Ref Range   Color, UA     Clarity, UA     Glucose, UA Negative Negative   Bilirubin, UA     Ketones, UA moderate    Spec Grav, UA     Blood, UA neg    pH, UA     POC,PROTEIN,UA Trace Negative, Trace, Small (1+), Moderate (2+), Large (3+), 4+   Urobilinogen, UA     Nitrite, UA neg    Leukocytes, UA Negative Negative   Appearance     Odor      Assessment & Plan:  1) High-risk pregnancy G2P1001 at [redacted]w[redacted]d with an Estimated Date of  Delivery: 04/01/18   2) A2DM, stable.  Treatment Plan: continue present meds; Twice weekly testing, IOL 39 weeks  3) 7.94mm right renal pelvis, stable.  Treatment Plan:  Makes peds aware  4)  Elevated LE (64/138): Had normal CMP 11 months ago;  Discussed w/JVF.  Will recheck CMP; pr/cr ratio and Hep C (couldn't add Hep C, so will need to redraw).  Ordered STAT and J[facility] will f/u this evening.   Follow-up:   Future Appointments  Date Time Provider Department Center  03/17/2018  9:00 AM Cheral Marker, CNM [facility]  03/20/2018  9:15 AM Lazaro Arms, MD [facility]  03/24/2018  9:00 AM Cheral Marker, CNM [facility]    Orders  Placed This Encounter  Procedures  . POC Urinalysis Dipstick OB   Jacklyn Shell [facility] 03/13/2018 9:37 AM

## 2018-03-14 ENCOUNTER — Encounter (HOSPITAL_COMMUNITY): Payer: Self-pay

## 2018-03-14 ENCOUNTER — Inpatient Hospital Stay (HOSPITAL_COMMUNITY)
Admission: AD | Admit: 2018-03-14 | Discharge: 2018-03-14 | Disposition: A | Discharge status: Home / Self Care | Payer: Medicaid Other | Source: Ambulatory Visit | Attending: Obstetrics and Gynecology | Admitting: Obstetrics and Gynecology

## 2018-03-14 DIAGNOSIS — R748 Abnormal levels of other serum enzymes: Secondary | ICD-10-CM | POA: Diagnosis present

## 2018-03-14 DIAGNOSIS — O26893 Other specified pregnancy related conditions, third trimester: Secondary | ICD-10-CM | POA: Diagnosis not present

## 2018-03-14 DIAGNOSIS — R030 Elevated blood-pressure reading, without diagnosis of hypertension: Secondary | ICD-10-CM

## 2018-03-14 DIAGNOSIS — L299 Pruritus, unspecified: Secondary | ICD-10-CM

## 2018-03-14 DIAGNOSIS — K828 Other specified diseases of gallbladder: Secondary | ICD-10-CM | POA: Diagnosis present

## 2018-03-14 DIAGNOSIS — O99713 Diseases of the skin and subcutaneous tissue complicating pregnancy, third trimester: Secondary | ICD-10-CM

## 2018-03-14 DIAGNOSIS — Z3A37 37 weeks gestation of pregnancy: Secondary | ICD-10-CM | POA: Diagnosis not present

## 2018-03-14 HISTORY — DX: Other specified diseases of gallbladder: K82.8

## 2018-03-14 LAB — COMPREHENSIVE METABOLIC PANEL
ALBUMIN: 3.7 g/dL — AB (ref 3.9–5.0)
ALT: 126 IU/L — ABNORMAL HIGH (ref 0–32)
ALT: 123 U/L — ABNORMAL HIGH (ref 0–44)
AST: 61 IU/L — ABNORMAL HIGH (ref 0–40)
AST: 63 U/L — ABNORMAL HIGH (ref 15–41)
Albumin/Globulin Ratio: 1.4 (ref 1.2–2.2)
Albumin: 3.1 g/dL — ABNORMAL LOW (ref 3.5–5.0)
Alkaline Phosphatase: 138 IU/L — ABNORMAL HIGH (ref 39–117)
Alkaline Phosphatase: 117 U/L (ref 38–126)
Anion gap: 11 (ref 5–15)
BILIRUBIN TOTAL: 0.4 mg/dL (ref 0.0–1.2)
BUN / CREAT RATIO: 16 (ref 9–23)
BUN: 7 mg/dL (ref 6–20)
BUN: 7 mg/dL (ref 6–20)
CALCIUM: 8.6 mg/dL — AB (ref 8.9–10.3)
CHLORIDE: 105 mmol/L (ref 96–106)
CO2: 20 mmol/L (ref 20–29)
CO2: 18 mmol/L — ABNORMAL LOW (ref 22–32)
CREATININE: 0.43 mg/dL — AB (ref 0.57–1.00)
Calcium: 9.3 mg/dL (ref 8.7–10.2)
Chloride: 105 mmol/L (ref 98–111)
Creatinine, Ser: 0.52 mg/dL (ref 0.44–1.00)
GFR calc Af Amer: >60 mL/min (ref >60)
GFR calc non Af Amer: 146 mL/min/{1.73_m2} (ref >59)
GFR, EST AFRICAN AMERICAN: 168 mL/min/{1.73_m2} (ref >59)
GLOBULIN, TOTAL: 2.6 g/dL (ref 1.5–4.5)
Glucose, Bld: 68 mg/dL — ABNORMAL LOW (ref 70–99)
Glucose: 76 mg/dL (ref 65–99)
Potassium: 4.4 mmol/L (ref 3.5–5.2)
Potassium: 3.6 mmol/L (ref 3.5–5.1)
SODIUM: 138 mmol/L (ref 134–144)
Sodium: 134 mmol/L — ABNORMAL LOW (ref 135–145)
TOTAL PROTEIN: 6.3 g/dL (ref 6.0–8.5)
Total Bilirubin: 0.7 mg/dL (ref 0.3–1.2)
Total Protein: 7.1 g/dL (ref 6.5–8.1)

## 2018-03-14 LAB — URINALYSIS, ROUTINE W REFLEX MICROSCOPIC
Glucose, UA: NEGATIVE mg/dL
Hgb urine dipstick: NEGATIVE
Ketones, ur: 40 mg/dL — AB
Nitrite: NEGATIVE
Protein, ur: NEGATIVE mg/dL
Specific Gravity, Urine: 1.015 (ref 1.005–1.030)
pH: 5.5 (ref 5.0–8.0)

## 2018-03-14 LAB — URINALYSIS, MICROSCOPIC (REFLEX)

## 2018-03-14 LAB — CBC
HCT: 35.3 % — ABNORMAL LOW (ref 36.0–46.0)
Hemoglobin: 11.5 g/dL — ABNORMAL LOW (ref 12.0–15.0)
MCH: 28.3 pg (ref 26.0–34.0)
MCHC: 32.6 g/dL (ref 30.0–36.0)
MCV: 86.9 fL (ref 80.0–100.0)
Platelets: 235 10*3/uL (ref 150–400)
RBC: 4.06 MIL/uL (ref 3.87–5.11)
RDW: 14 % (ref 11.5–15.5)
WBC: 8.1 10*3/uL (ref 4.0–10.5)
nRBC: 0 % (ref 0.0–0.2)

## 2018-03-14 LAB — PROTEIN / CREATININE RATIO, URINE
Creatinine, Urine: 113 mg/dL
Creatinine, Urine: 140 mg/dL
Protein Creatinine Ratio: 0.31 mg/mg{Cre} — ABNORMAL HIGH (ref 0.00–0.15)
Protein, Ur: 52.3 mg/dL
Protein/Creat Ratio: 463 mg/g creat — ABNORMAL HIGH (ref 0–200)
Total Protein, Urine: 44 mg/dL

## 2018-03-14 LAB — FIBRINOGEN: Fibrinogen: 580 mg/dL — ABNORMAL HIGH (ref 210–475)

## 2018-03-14 LAB — CULTURE, BETA STREP (GROUP B ONLY): Strep Gp B Culture: NEGATIVE

## 2018-03-14 LAB — APTT: aPTT: 30 seconds (ref 24–36)

## 2018-03-14 LAB — PROTIME-INR
INR: 0.95
Prothrombin Time: 12.6 seconds (ref 11.4–15.2)

## 2018-03-14 NOTE — Discharge Instructions (Signed)
.  Preeclampsia and Eclampsia    Preeclampsia is a serious condition that may develop during pregnancy. It is also called toxemia of pregnancy. This condition causes high blood pressure along with other symptoms, such as swelling and headaches. These symptoms may develop as the condition gets worse. Preeclampsia may occur at 20 weeks of pregnancy or later.  Diagnosing and treating preeclampsia early is very important. If not treated early, it can cause serious problems for you and your baby. One problem it can lead to is eclampsia. Eclampsia is a condition that causes muscle jerking or shaking (convulsions or seizures) and other serious problems for the mother. During pregnancy, delivering your baby may be the best treatment for preeclampsia or eclampsia. For most women, preeclampsia and eclampsia symptoms go away after giving birth.  In rare cases, a woman may develop preeclampsia after giving birth (postpartum preeclampsia). This usually occurs within 48 hours after childbirth but may occur up to 6 weeks after giving birth.  What are the causes?  The cause of preeclampsia is not known.  What increases the risk?  The following risk factors make you more likely to develop preeclampsia:   Being pregnant for the first time.   Having had preeclampsia during a past pregnancy.   Having a family history of preeclampsia.   Having high blood pressure.   Being pregnant with more than one baby.   Being 35 or older.   Being African-American.   Having kidney disease or diabetes.   Having medical conditions such as lupus or blood diseases.   Being very overweight (obese).  What are the signs or symptoms?  The earliest signs of preeclampsia are:   High blood pressure.   Increased protein in your urine. Your health care provider will check for this at every visit before you give birth (prenatal visit).  Other symptoms that may develop as the condition gets worse include:   Severe headaches.   Sudden weight  gain.   Swelling of the hands, face, legs, and feet.   Nausea and vomiting.   Vision problems, such as blurred or double vision.   Numbness in the face, arms, legs, and feet.   Urinating less than usual.   Dizziness.   Slurred speech.   Abdominal pain, especially upper abdominal pain.   Convulsions or seizures.  How is this diagnosed?  There are no screening tests for preeclampsia. Your health care provider will ask you about symptoms and check for signs of preeclampsia during your prenatal visits. You may also have tests that include:   Urine tests.   Blood tests.   Checking your blood pressure.   Monitoring your baby's heart rate.   Ultrasound.  How is this treated?  You and your health care provider will determine the treatment approach that is best for you. Treatment may include:   Having more frequent prenatal exams to check for signs of preeclampsia, if you have an increased risk for preeclampsia.   Medicine to lower your blood pressure.   Staying in the hospital, if your condition is severe. There, treatment will focus on controlling your blood pressure and the amount of fluids in your body (fluid retention).   Taking medicine (magnesium sulfate) to prevent seizures. This may be given as an injection or through an IV.   Taking a low-dose aspirin during your pregnancy.   Delivering your baby early, if your condition gets worse. You may have your labor started with medicine (induced), or you may have a cesarean   delivery.  Follow these instructions at home:  Eating and drinking     Drink enough fluid to keep your urine pale yellow.   Avoid caffeine.  Lifestyle   Do not use any products that contain nicotine or tobacco, such as cigarettes and e-cigarettes. If you need help quitting, ask your health care provider.   Do not use alcohol or drugs.   Avoid stress as much as possible. Rest and get plenty of sleep.  General instructions   Take over-the-counter and prescription medicines only as  told by your health care provider.   When lying down, lie on your left side. This keeps pressure off your major blood vessels.   When sitting or lying down, raise (elevate) your feet. Try putting some pillows underneath your lower legs.   Exercise regularly. Ask your health care provider what kinds of exercise are best for you.   Keep all follow-up and prenatal visits as told by your health care provider. This is important.  How is this prevented?  There is no known way of preventing preeclampsia or eclampsia from developing. However, to lower your risk of complications and detect problems early:   Get regular prenatal care. Your health care provider may be able to diagnose and treat the condition early.   Maintain a healthy weight. Ask your health care provider for help managing weight gain during pregnancy.   Work with your health care provider to manage any long-term (chronic) health conditions you have, such as diabetes or kidney problems.   You may have tests of your blood pressure and kidney function after giving birth.   Your health care provider may have you take low-dose aspirin during your next pregnancy.  Contact a health care provider if:   You have symptoms that your health care provider told you may require more treatment or monitoring, such as:  ? Headaches.  ? Nausea or vomiting.  ? Abdominal pain.  ? Dizziness.  ? Light-headedness.  Get help right away if:   You have severe:  ? Abdominal pain.  ? Headaches that do not get better.  ? Dizziness.  ? Vision problems.  ? Confusion.  ? Nausea or vomiting.   You have any of the following:  ? A seizure.  ? Sudden, rapid weight gain.  ? Sudden swelling in your hands, ankles, or face.  ? Trouble moving any part of your body.  ? Numbness in any part of your body.  ? Trouble speaking.  ? Abnormal bleeding.   You faint.  Summary   Preeclampsia is a serious condition that may develop during pregnancy. It is also called toxemia of pregnancy.   This  condition causes high blood pressure along with other symptoms, such as swelling and headaches.   Diagnosing and treating preeclampsia early is very important. If not treated early, it can cause serious problems for you and your baby.   Get help right away if you have symptoms that your health care provider told you to watch for.  This information is not intended to replace advice given to you by your health care provider. Make sure you discuss any questions you have with your health care provider.  Document Released: 01/12/2000 Document Revised: 12/31/2016 Document Reviewed: 08/21/2015  Elsevier Interactive Patient Education  2019 Elsevier Inc.

## 2018-03-14 NOTE — MAU Note (Signed)
Patient here for lab work. Denies complaints at this time. Patient to room for monitoring per Ivonne Andrew CNM

## 2018-03-14 NOTE — MAU Provider Note (Signed)
Chief Complaint  Patient presents with  . labs     First Provider Initiated Contact with Patient 03/14/18 1806      S: Mary Hull  is a 23 y.o. y.o. year old G13P1001 female at 38w3dweeks gestation who instructed to come to MAU for blood work.  She was tested for cholestasis of [facility] at [facility] because she reported itching all over without a rash.  Bile acids were normal but AST and ALT were elevated.  Patient was seen in the office 03/13/2018 liver enzymes were repeated and still elevated and protein creatinine ratio was also elevated. no Hx HTN.   Associated symptoms: Denies headache, vision changes, epigastric pain.  Denies itching today.  Taken any medicine for it. Contractions: Denies Vaginal bleeding: Denies Fetal movement: Active  Past Medical History:  Diagnosis Date  . Asthma   . Gallbladder sludge   . Gestational diabetes    diet controlled  . Postpartum depression    Past Surgical History:  Procedure Laterality Date  . FRACTURE SURGERY     L elbow  . TONSILLECTOMY       History reviewed. No pertinent family history.   O:  Patient Vitals for the past 24 hrs:  BP Temp Temp src Pulse Resp SpO2 Weight  03/14/18 1800 139/79 - - 91 - - -  03/14/18 1745 138/74 - - 98 - - -  03/14/18 1734 131/77 97.7 F (36.5 C) Oral (!) 108 18 100 % 92.6 kg   General: NAD Heart: Regular rate Lungs: Normal rate and effort Abd: Soft, NT, Gravid, S=D Extremities: Trace pedal edema Neuro: 2+ deep tendon reflexes, No clonus Pelvic: Deferred    EFM: 130, Moderate variability, 15 x 15 accelerations, no decelerations Toco: Irregular, painless  Results for orders placed or performed during the hospital encounter of 03/14/18 (from the past 24 hour(s))  Protein / creatinine ratio, urine     Status: Abnormal   Collection Time: 03/14/18  5:34 PM  Result Value Ref Range   Creatinine, Urine 140.00 mg/dL   Total Protein, Urine 44 mg/dL   Protein Creatinine Ratio  0.31 (H) 0.00 - 0.15 mg/mg[Cre]  Urinalysis, Routine w reflex microscopic     Status: Abnormal   Collection Time: 03/14/18  5:34 PM  Result Value Ref Range   Color, Urine YELLOW YELLOW   APPearance CLEAR CLEAR   Specific Gravity, Urine 1.015 1.005 - 1.030   pH 5.5 5.0 - 8.0   Glucose, UA NEGATIVE NEGATIVE mg/dL   Hgb urine dipstick NEGATIVE NEGATIVE   Bilirubin Urine SMALL (A) NEGATIVE   Ketones, ur 40 (A) NEGATIVE mg/dL   Protein, ur NEGATIVE NEGATIVE mg/dL   Nitrite NEGATIVE NEGATIVE   Leukocytes,Ua MODERATE (A) NEGATIVE  Urinalysis, Microscopic (reflex)     Status: Abnormal   Collection Time: 03/14/18  5:34 PM  Result Value Ref Range   RBC / HPF 0-5 0 - 5 RBC/hpf   WBC, UA 6-10 0 - 5 WBC/hpf   Bacteria, UA FEW (A) NONE SEEN   Squamous Epithelial / LPF 0-5 0 - 5  CBC     Status: Abnormal   Collection Time: 03/14/18  5:44 PM  Result Value Ref Range   WBC 8.1 4.0 - 10.5 K/uL   RBC 4.06 3.87 - 5.11 MIL/uL   Hemoglobin 11.5 (L) 12.0 - 15.0 g/dL   HCT 35.3 (L) 36.0 - 46.0 %   MCV 86.9 80.0 - 100.0 fL   MCH 28.3 26.0 -  34.0 pg   MCHC 32.6 30.0 - 36.0 g/dL   RDW 14.0 11.5 - 15.5 %   Platelets 235 150 - 400 K/uL   nRBC 0.0 0.0 - 0.2 %  Comprehensive metabolic panel     Status: Abnormal   Collection Time: 03/14/18  5:44 PM  Result Value Ref Range   Sodium 134 (L) 135 - 145 mmol/L   Potassium 3.6 3.5 - 5.1 mmol/L   Chloride 105 98 - 111 mmol/L   CO2 18 (L) 22 - 32 mmol/L   Glucose, Bld 68 (L) 70 - 99 mg/dL   BUN 7 6 - 20 mg/dL   Creatinine, Ser 0.52 0.44 - 1.00 mg/dL   Calcium 8.6 (L) 8.9 - 10.3 mg/dL   Total Protein 7.1 6.5 - 8.1 g/dL   Albumin 3.1 (L) 3.5 - 5.0 g/dL   AST 63 (H) 15 - 41 U/L   ALT 123 (H) 0 - 44 U/L   Alkaline Phosphatase 117 38 - 126 U/L   Total Bilirubin 0.7 0.3 - 1.2 mg/dL   GFR calc non Af Amer >60 >60 mL/min   GFR calc Af Amer >60 >60 mL/min   Anion gap 11 5 - 15   Discussed Hx, labs, exam w/ Dr. Ilda Basset. New orders: CBC, CMP, bile acids, protein  creatinine ratio, acute hepatitis panel, fibrinogen, PT, PTT.  Reviewed results of CBC, c-Met, protein creatinine ratio with Dr. Ilda Basset.  May discharge home.  Will call for any other significantly abnormal results.  Preeclampsia precautions.  Cholestasis precautions.  HELLP precautions.  A: 46w3dweek IUP Liver enzymes elevated but stable.  No evidence of HELLP syndrome Blood pressures increased from baseline, but do not meet criteria for gestational hypertension. FHR reactive  P: Discharge home in stable condition per consult with PAletha Halim MD Preeclampsia precautions. Follow-up Information    [facility] on 03/17/2018.   Why:  As scheduled or sooner as needed if symptoms worsen Contact information: [id number].   Specialty:  Obstetrics and Gynecology Why:  As needed if symptoms worsen Contact information: [id number]        Allergies as of 03/14/2018      Reactions   Tussionex Pennkinetic Er [hydrocod Polst-cpm Polst Er] Hives, Swelling, Other (See Comments)   Reaction:  Unspecified swelling reaction       Medication List    STOP taking these medications   acetaminophen 500 MG tablet Commonly known as:  TYLENOL     TAKE these medications   glyBURIDE 5 MG tablet Commonly known as:  DIABETA Take 1/2 tablet in am  1 tablet at after supper   PRENATAL VITAMIN PO Take by mouth daily.        STamala Julian VVermont CNorth Dakota2/15/2020 6:36 PM

## 2018-03-15 ENCOUNTER — Inpatient Hospital Stay (HOSPITAL_COMMUNITY)
Admission: AD | Admit: 2018-03-15 | Discharge: 2018-03-15 | Disposition: A | Discharge status: Home / Self Care | Payer: Medicaid Other | Source: Ambulatory Visit | Attending: Obstetrics & Gynecology | Admitting: Obstetrics & Gynecology

## 2018-03-15 ENCOUNTER — Encounter (HOSPITAL_COMMUNITY): Payer: Self-pay

## 2018-03-15 ENCOUNTER — Other Ambulatory Visit: Payer: Self-pay

## 2018-03-15 DIAGNOSIS — O479 False labor, unspecified: Secondary | ICD-10-CM | POA: Diagnosis not present

## 2018-03-15 DIAGNOSIS — O471 False labor at or after 37 completed weeks of gestation: Secondary | ICD-10-CM | POA: Diagnosis not present

## 2018-03-15 DIAGNOSIS — Z3A38 38 weeks gestation of pregnancy: Secondary | ICD-10-CM | POA: Insufficient documentation

## 2018-03-15 LAB — HEPATITIS PANEL, ACUTE
HCV Ab: <0.1 s/co ratio (ref 0.0–0.9)
Hep A IgM: NEGATIVE
Hep B C IgM: NEGATIVE
Hepatitis B Surface Ag: NEGATIVE

## 2018-03-15 NOTE — MAU Note (Signed)
Mary Hull is a 23 y.o. at [redacted]w[redacted]d here in MAU reporting: contractions every 5-6 min, no LOF, no bleeding, some decreased fetal movement  Onset of complaint: today  Pain score: 5/10  Vitals:   03/15/18 1247  BP: 134/79  Pulse: (!) 103  Resp: 18  Temp: 98.2 F (36.8 C)  SpO2: 95%      FHT:147  Lab orders placed from triage: none

## 2018-03-15 NOTE — Discharge Instructions (Signed)

## 2018-03-16 ENCOUNTER — Observation Stay (HOSPITAL_COMMUNITY)
Admission: AD | Admit: 2018-03-16 | Discharge: 2018-03-18 | Disposition: A | Discharge status: Home / Self Care | Payer: Medicaid Other | Attending: Obstetrics and Gynecology | Admitting: Obstetrics and Gynecology

## 2018-03-16 DIAGNOSIS — O9A219 Injury, poisoning and certain other consequences of external causes complicating pregnancy, unspecified trimester: Secondary | ICD-10-CM | POA: Diagnosis present

## 2018-03-16 DIAGNOSIS — W06XXXA Fall from bed, initial encounter: Secondary | ICD-10-CM | POA: Insufficient documentation

## 2018-03-16 DIAGNOSIS — O0990 Supervision of high risk pregnancy, unspecified, unspecified trimester: Secondary | ICD-10-CM

## 2018-03-16 DIAGNOSIS — O358XX0 Maternal care for other (suspected) fetal abnormality and damage, not applicable or unspecified: Secondary | ICD-10-CM | POA: Insufficient documentation

## 2018-03-16 DIAGNOSIS — S3991XA Unspecified injury of abdomen, initial encounter: Secondary | ICD-10-CM | POA: Diagnosis present

## 2018-03-16 DIAGNOSIS — O24415 Gestational diabetes mellitus in pregnancy, controlled by oral hypoglycemic drugs: Secondary | ICD-10-CM | POA: Insufficient documentation

## 2018-03-16 DIAGNOSIS — F17210 Nicotine dependence, cigarettes, uncomplicated: Secondary | ICD-10-CM | POA: Insufficient documentation

## 2018-03-16 DIAGNOSIS — Z3A37 37 weeks gestation of pregnancy: Secondary | ICD-10-CM | POA: Insufficient documentation

## 2018-03-16 DIAGNOSIS — O26893 Other specified pregnancy related conditions, third trimester: Principal | ICD-10-CM | POA: Insufficient documentation

## 2018-03-16 DIAGNOSIS — R109 Unspecified abdominal pain: Secondary | ICD-10-CM | POA: Insufficient documentation

## 2018-03-16 DIAGNOSIS — O35EXX0 Maternal care for other (suspected) fetal abnormality and damage, fetal genitourinary anomalies, not applicable or unspecified: Secondary | ICD-10-CM | POA: Diagnosis present

## 2018-03-16 DIAGNOSIS — O24419 Gestational diabetes mellitus in pregnancy, unspecified control: Secondary | ICD-10-CM

## 2018-03-16 DIAGNOSIS — R748 Abnormal levels of other serum enzymes: Secondary | ICD-10-CM | POA: Diagnosis present

## 2018-03-16 DIAGNOSIS — O99333 Smoking (tobacco) complicating pregnancy, third trimester: Secondary | ICD-10-CM | POA: Insufficient documentation

## 2018-03-16 DIAGNOSIS — Z8632 Personal history of gestational diabetes: Secondary | ICD-10-CM | POA: Diagnosis present

## 2018-03-16 LAB — BILE ACIDS, TOTAL: Bile Acids Total: 2 umol/L (ref 0.0–10.0)

## 2018-03-16 NOTE — MAU Note (Signed)
Pt reports she fell off her bed tonight around 11pm. States she has a high bed and got up to use the bathroom and she stepped off the bed the wrong way and fell on her right side. States she started having some abdominal pain after the fall. Pt denies LOF or vaginal bleeding. Reports some decreased fetal movement for the last few days, but has felt baby move.

## 2018-03-17 ENCOUNTER — Other Ambulatory Visit: Payer: Medicaid Other | Admitting: Women's Health

## 2018-03-17 ENCOUNTER — Other Ambulatory Visit: Payer: Self-pay

## 2018-03-17 ENCOUNTER — Encounter (HOSPITAL_COMMUNITY): Payer: Self-pay | Admitting: *Deleted

## 2018-03-17 DIAGNOSIS — F17210 Nicotine dependence, cigarettes, uncomplicated: Secondary | ICD-10-CM | POA: Diagnosis not present

## 2018-03-17 DIAGNOSIS — Z3A37 37 weeks gestation of pregnancy: Secondary | ICD-10-CM | POA: Diagnosis not present

## 2018-03-17 DIAGNOSIS — O9A213 Injury, poisoning and certain other consequences of external causes complicating pregnancy, third trimester: Secondary | ICD-10-CM

## 2018-03-17 DIAGNOSIS — O99333 Smoking (tobacco) complicating pregnancy, third trimester: Secondary | ICD-10-CM | POA: Diagnosis not present

## 2018-03-17 DIAGNOSIS — W06XXXA Fall from bed, initial encounter: Secondary | ICD-10-CM | POA: Diagnosis not present

## 2018-03-17 DIAGNOSIS — R109 Unspecified abdominal pain: Secondary | ICD-10-CM

## 2018-03-17 DIAGNOSIS — O26893 Other specified pregnancy related conditions, third trimester: Secondary | ICD-10-CM | POA: Diagnosis not present

## 2018-03-17 DIAGNOSIS — O9A219 Injury, poisoning and certain other consequences of external causes complicating pregnancy, unspecified trimester: Secondary | ICD-10-CM | POA: Diagnosis present

## 2018-03-17 DIAGNOSIS — O358XX0 Maternal care for other (suspected) fetal abnormality and damage, not applicable or unspecified: Secondary | ICD-10-CM | POA: Diagnosis not present

## 2018-03-17 DIAGNOSIS — S3991XA Unspecified injury of abdomen, initial encounter: Secondary | ICD-10-CM | POA: Diagnosis present

## 2018-03-17 DIAGNOSIS — O24415 Gestational diabetes mellitus in pregnancy, controlled by oral hypoglycemic drugs: Secondary | ICD-10-CM | POA: Diagnosis not present

## 2018-03-17 LAB — CBC
HCT: 33.6 % — ABNORMAL LOW (ref 36.0–46.0)
Hemoglobin: 11 g/dL — ABNORMAL LOW (ref 12.0–15.0)
MCH: 28.6 pg (ref 26.0–34.0)
MCHC: 32.7 g/dL (ref 30.0–36.0)
MCV: 87.3 fL (ref 80.0–100.0)
Platelets: 233 10*3/uL (ref 150–400)
RBC: 3.85 MIL/uL — ABNORMAL LOW (ref 3.87–5.11)
RDW: 14.2 % (ref 11.5–15.5)
WBC: 8.1 10*3/uL (ref 4.0–10.5)
nRBC: 0 % (ref 0.0–0.2)

## 2018-03-17 LAB — TYPE AND SCREEN
ABO/RH(D): A POS
ANTIBODY SCREEN: NEGATIVE

## 2018-03-17 LAB — GLUCOSE, CAPILLARY
Glucose-Capillary: 109 mg/dL — ABNORMAL HIGH (ref 70–99)
Glucose-Capillary: 72 mg/dL (ref 70–99)

## 2018-03-17 MED ORDER — DOCUSATE SODIUM 100 MG PO CAPS
100.0000 mg | ORAL_CAPSULE | Freq: Every day | ORAL | Status: DC
  Administered 2018-03-18: 100 mg via ORAL
  Filled 2018-03-17 (×2): qty 1

## 2018-03-17 MED ORDER — PRENATAL MULTIVITAMIN CH
1.0000 | ORAL_TABLET | Freq: Every day | ORAL | Status: DC
  Administered 2018-03-17: 1 via ORAL
  Filled 2018-03-17: qty 1

## 2018-03-17 MED ORDER — LACTATED RINGERS IV SOLN
Freq: Once | INTRAVENOUS | Status: AC
  Administered 2018-03-17: 03:00:00 via INTRAVENOUS

## 2018-03-17 MED ORDER — GLYBURIDE 5 MG PO TABS
5.0000 mg | ORAL_TABLET | Freq: Every day | ORAL | Status: DC
  Administered 2018-03-17: 5 mg via ORAL
  Filled 2018-03-17: qty 1

## 2018-03-17 MED ORDER — ACETAMINOPHEN 325 MG PO TABS: 650.0000 mg | ORAL_TABLET | ORAL | Status: DC | PRN

## 2018-03-17 MED ORDER — ZOLPIDEM TARTRATE 5 MG PO TABS: 5.0000 mg | ORAL_TABLET | Freq: Every evening | ORAL | Status: DC | PRN

## 2018-03-17 MED ORDER — GLYBURIDE 2.5 MG PO TABS
2.5000 mg | ORAL_TABLET | Freq: Every day | ORAL | Status: DC
  Administered 2018-03-18: 2.5 mg via ORAL
  Filled 2018-03-17: qty 1

## 2018-03-17 MED ORDER — CALCIUM CARBONATE ANTACID 500 MG PO CHEW: 2.0000 | CHEWABLE_TABLET | ORAL | Status: DC | PRN

## 2018-03-17 NOTE — H&P (Signed)
Calvert Cantor, CNM  Certified Nurse Midwife  Obstetrics  MAU Provider Note  Attested  Date of Service:  03/17/2018 3:19 AM          Attested      Attestation signed by Adam Phenix, MD at 03/17/2018 7:26 AM  Attestation of Attending Supervision of Advanced Practitioner (CNM/NP/PA): Evaluation and management procedures were performed by the Advanced Practitioner under my supervision and collaboration. I have reviewed the Advanced Practitioner's note and chart, and I agree with the management and plan.  Scheryl Darter MD        Show:Clear all [x] Manual[x] Template[] Copied  Added by: [x] Calvert Cantor, CNM  [] Hover for details  History   CSN: [id number]  Arrival date and time: 03/16/18 2343   First Provider Initiated Contact with Patient 03/17/18 0025         Chief Complaint  Patient presents with  . Fall  . Abdominal Pain   HPI Mary Hull is a 23 y.o. G2P1001 at [redacted]w[redacted]d who presents to MAU for evaluation after she fell out of bed at 11pm on 03/16/2018. She endorses falling on to the right side of her body and believes her abdomen made contact with her floor. She denies vaginal bleeding, leaking of fluid, decreased fetal movement, fever, falls, or recent illness.            OB History    Gravida  2   Para  1   Term  1   Preterm      AB      Living  1     SAB      TAB      Ectopic      Multiple  0   Live Births  1               Past Medical History:  Diagnosis Date  . Asthma   . Gallbladder sludge   . Gestational diabetes    diet controlled  . Postpartum depression          Past Surgical History:  Procedure Laterality Date  . ADENOIDECTOMY    . FRACTURE SURGERY     L elbow  . TONSILLECTOMY      History reviewed. No pertinent family history.  Social History        Tobacco Use  . Smoking status: Current Some Day Smoker    Years: 1.00    Types: Cigarettes  .  Smokeless tobacco: Never Used  Substance Use Topics  . Alcohol use: No  . Drug use: No    Allergies:       Allergies  Allergen Reactions  . Tussionex Pennkinetic Er [Hydrocod Polst-Cpm Polst Er] Hives, Swelling and Other (See Comments)    Reaction:  Unspecified swelling reaction            Medications Prior to Admission  Medication Sig Dispense Refill Last Dose  . glyBURIDE (DIABETA) 5 MG tablet Take 1/2 tablet in am  1 tablet at after supper 60 tablet 1 03/16/2018 at Unknown time  . Prenatal Vit-Fe Fumarate-FA (PRENATAL VITAMIN PO) Take by mouth daily.   03/16/2018 at Unknown time    Review of Systems  Constitutional: Negative for chills, fatigue and fever.  Respiratory: Negative for shortness of breath.   Gastrointestinal: Positive for abdominal pain.  Genitourinary: Negative for difficulty urinating, dysuria, flank pain, vaginal bleeding, vaginal discharge and vaginal pain.  Musculoskeletal: Negative for back pain.  Neurological: Negative for dizziness, seizures, weakness and headaches.  All other systems reviewed and are negative.  Physical Exam   Blood pressure 133/80, pulse (!) 104, temperature 98.2 F (36.8 C), temperature source Oral, resp. rate 18, height 5\' 1"  (1.549 m), weight 91.6 kg, last menstrual period 06/25/2017, SpO2 96 %, not currently breastfeeding.  Physical Exam  Nursing note and vitals reviewed. Constitutional: She is oriented to person, place, and time. She appears well-developed and well-nourished.  Cardiovascular: Normal rate.  Respiratory: Effort normal.  GI: She exhibits no distension. There is no abdominal tenderness. There is no rebound and no guarding.  Gravid  Genitourinary:    Vagina normal.     No vaginal discharge.   Neurological: She is alert and oriented to person, place, and time.  Skin: Skin is warm and dry.  Psychiatric: She has a normal mood and affect. Her behavior is normal. Judgment and thought content normal.     MAU Course/MDM  Procedures  --Reactive tracing: baseline 130, moderate variability, positive accels, no decels --Toco: irregular mild ctx q 3-5 min  Patient Vitals for the past 24 hrs:  BP Temp Temp src Pulse Resp SpO2 Height Weight  03/17/18 0240 132/84 - - 89 - - - -  03/16/18 2354 133/80 98.2 F (36.8 C) Oral (!) 104 18 96 % 5\' 1"  (1.549 m) 91.6 kg      Assessment and Plan  --23 y.o. G2P1001 at [redacted]w[redacted]d  --Reactive tracing --Contracting q 3-5 min s/p fall at 11pm 03/16/2018 --Per consult with Dr. Debroah Loop, place in 23 hours Observation on Antepartum unit  Calvert Cantor, CNM 03/17/2018, 3:20 AM         Cosigned by: Adam Phenix, MD at 03/17/2018 7:26 AM  Revision History

## 2018-03-17 NOTE — Progress Notes (Signed)
FACULTY PRACTICE ANTEPARTUM PROGRESS NOTE  Mary Hull is a 23 y.o. G2P1001 at [redacted]w[redacted]d who is admitted for abdominal trauma.  Estimated Date of Delivery: 04/01/18 Fetal presentation is cephalic.  Length of Stay:  1 Days. Admitted 03/16/2018  Subjective:  Patient reports normal fetal movement.  She denies uterine contractions, denies bleeding and leaking of fluid per vagina. Denies any other complaints, no abdominal pain.  Vitals:  Blood pressure 125/79, pulse (!) 106, temperature 98.2 F (36.8 C), temperature source Oral, resp. rate 18, height 5\' 1"  (1.549 m), weight 91.6 kg, last menstrual period 06/25/2017, SpO2 96 %, not currently breastfeeding. Physical Examination: CONSTITUTIONAL: Well-developed, well-nourished female in no acute distress.  HENT:  Normocephalic, atraumatic, External right and left ear normal. Oropharynx is clear and moist EYES: Conjunctivae and EOM are normal. Pupils are equal, round, and reactive to light. No scleral icterus.  NECK: Normal range of motion, supple, no masses. SKIN: Skin is warm and dry. No rash noted. Not diaphoretic. No erythema. No pallor. NEUROLGIC: Alert and oriented to person, place, and time. Normal reflexes, muscle tone coordination. No cranial nerve deficit noted. PSYCHIATRIC: Normal mood and affect. Normal behavior. Normal judgment and thought content. CARDIOVASCULAR: Normal heart rate noted, regular rhythm RESPIRATORY: Effort and breath sounds normal, no problems with respiration noted MUSCULOSKELETAL: Normal range of motion. No edema and no tenderness. ABDOMEN: Soft, nontender, nondistended, gravid. CERVIX: deferred  Fetal monitoring: FHR: 140s bpm, Variability: moderate, Accelerations: Present, Decelerations: Absent  Uterine activity: no contractions per hour  Results for orders placed or performed during the hospital encounter of 03/16/18 (from the past 48 hour(s))  CBC     Status: Abnormal   Collection Time: 03/17/18  3:30 AM   Result Value Ref Range   WBC 8.1 4.0 - 10.5 K/uL   RBC 3.85 (L) 3.87 - 5.11 MIL/uL   Hemoglobin 11.0 (L) 12.0 - 15.0 g/dL   HCT 29.9 (L) 37.1 - 69.6 %   MCV 87.3 80.0 - 100.0 fL   MCH 28.6 26.0 - 34.0 pg   MCHC 32.7 30.0 - 36.0 g/dL   RDW 78.9 38.1 - 01.7 %   Platelets 233 150 - 400 K/uL   nRBC 0.0 0.0 - 0.2 %    Comment: Performed at [facility], [address] [place], [place] [address]  Type and screen [facility]     Status: None   Collection Time: 03/17/18  3:30 AM  Result Value Ref Range   ABO/RH(D) A POS    Antibody Screen NEG    Sample Expiration      03/20/2018 Performed at [facility], [address] [place], [place] [address]     No results found.  Current scheduled medications . docusate sodium  100 mg Oral Daily  . prenatal multivitamin  1 tablet Oral Q1200    I have reviewed the patient's current medications.  ASSESSMENT: Active Problems:   Trauma during pregnancy   Abdominal trauma   PLAN: S/p abdominal trauma - for 24 hr obs - no acute issues - likely dc in am - RH+  FWB - continuous monitoring  Routine care - PNV - regular diet - bedrest with bathroom privileges   Continue routine antenatal care.   Baldemar Lenis, M.D. [facility]ending [facility] [employer] (Faculty Practice)  03/17/2018 5:10 PM

## 2018-03-17 NOTE — MAU Provider Note (Signed)
History     CSN: [id number]  Arrival date and time: 03/16/18 2343   First Provider Initiated Contact with Patient 03/17/18 0025      Chief Complaint  Patient presents with  . Fall  . Abdominal Pain   HPI Mary Hull is a 23 y.o. G2P1001 at [redacted]w[redacted]d who presents to MAU for evaluation after she fell out of bed at 11pm on 03/16/2018. She endorses falling on to the right side of her body and believes her abdomen made contact with her floor. She denies vaginal bleeding, leaking of fluid, decreased fetal movement, fever, falls, or recent illness.    OB History    Gravida  2   Para  1   Term  1   Preterm      AB      Living  1     SAB      TAB      Ectopic      Multiple  0   Live Births  1           Past Medical History:  Diagnosis Date  . Asthma   . Gallbladder sludge   . Gestational diabetes    diet controlled  . Postpartum depression     Past Surgical History:  Procedure Laterality Date  . ADENOIDECTOMY    . FRACTURE SURGERY     L elbow  . TONSILLECTOMY      History reviewed. No pertinent family history.  Social History   Tobacco Use  . Smoking status: Current Some Day Smoker    Years: 1.00    Types: Cigarettes  . Smokeless tobacco: Never Used  Substance Use Topics  . Alcohol use: No  . Drug use: No    Allergies:  Allergies  Allergen Reactions  . Tussionex Pennkinetic Er [Hydrocod Polst-Cpm Polst Er] Hives, Swelling and Other (See Comments)    Reaction:  Unspecified swelling reaction     Medications Prior to Admission  Medication Sig Dispense Refill Last Dose  . glyBURIDE (DIABETA) 5 MG tablet Take 1/2 tablet in am  1 tablet at after supper 60 tablet 1 03/16/2018 at Unknown time  . Prenatal Vit-Fe Fumarate-FA (PRENATAL VITAMIN PO) Take by mouth daily.   03/16/2018 at Unknown time    Review of Systems  Constitutional: Negative for chills, fatigue and fever.  Respiratory: Negative for shortness of breath.   Gastrointestinal:  Positive for abdominal pain.  Genitourinary: Negative for difficulty urinating, dysuria, flank pain, vaginal bleeding, vaginal discharge and vaginal pain.  Musculoskeletal: Negative for back pain.  Neurological: Negative for dizziness, seizures, weakness and headaches.  All other systems reviewed and are negative.  Physical Exam   Blood pressure 133/80, pulse (!) 104, temperature 98.2 F (36.8 C), temperature source Oral, resp. rate 18, height 5\' 1"  (1.549 m), weight 91.6 kg, last menstrual period 06/25/2017, SpO2 96 %, not currently breastfeeding.  Physical Exam  Nursing note and vitals reviewed. Constitutional: She is oriented to person, place, and time. She appears well-developed and well-nourished.  Cardiovascular: Normal rate.  Respiratory: Effort normal.  GI: She exhibits no distension. There is no abdominal tenderness. There is no rebound and no guarding.  Gravid  Genitourinary:    Vagina normal.     No vaginal discharge.   Neurological: She is alert and oriented to person, place, and time.  Skin: Skin is warm and dry.  Psychiatric: She has a normal mood and affect. Her behavior is normal. Judgment and thought content normal.  MAU Course/MDM  Procedures  --Reactive tracing: baseline 130, moderate variability, positive accels, no decels --Toco: irregular mild ctx q 3-5 min  Patient Vitals for the past 24 hrs:  BP Temp Temp src Pulse Resp SpO2 Height Weight  03/17/18 0240 132/84 - - 89 - - - -  03/16/18 2354 133/80 98.2 F (36.8 C) Oral (!) 104 18 96 % 5\' 1"  (1.549 m) 91.6 kg      Assessment and Plan  --23 y.o. G2P1001 at [redacted]w[redacted]d  --Reactive tracing --Contracting q 3-5 min s/p fall at 11pm 03/16/2018 --Per consult with Dr. Debroah Loop, place in 23 hours Observation on Antepartum unit  Calvert Cantor, CNM 03/17/2018, 3:20 AM

## 2018-03-18 ENCOUNTER — Telehealth: Payer: Self-pay | Admitting: Obstetrics & Gynecology

## 2018-03-18 DIAGNOSIS — R748 Abnormal levels of other serum enzymes: Secondary | ICD-10-CM | POA: Diagnosis present

## 2018-03-18 DIAGNOSIS — R109 Unspecified abdominal pain: Secondary | ICD-10-CM | POA: Diagnosis not present

## 2018-03-18 DIAGNOSIS — Z3A37 37 weeks gestation of pregnancy: Secondary | ICD-10-CM | POA: Diagnosis not present

## 2018-03-18 DIAGNOSIS — O9A213 Injury, poisoning and certain other consequences of external causes complicating pregnancy, third trimester: Secondary | ICD-10-CM | POA: Diagnosis not present

## 2018-03-18 LAB — CBC WITH DIFFERENTIAL/PLATELET
Basophils Absolute: 0 10*3/uL (ref 0.0–0.1)
Basophils Relative: 0 %
EOS ABS: 0.1 10*3/uL (ref 0.0–0.5)
Eosinophils Relative: 1 %
HCT: 36.1 % (ref 36.0–46.0)
Hemoglobin: 11.8 g/dL — ABNORMAL LOW (ref 12.0–15.0)
Lymphocytes Relative: 24 %
Lymphs Abs: 2.1 10*3/uL (ref 0.7–4.0)
MCH: 28.4 pg (ref 26.0–34.0)
MCHC: 32.7 g/dL (ref 30.0–36.0)
MCV: 87 fL (ref 80.0–100.0)
Monocytes Absolute: 0.4 10*3/uL (ref 0.1–1.0)
Monocytes Relative: 5 %
NEUTROS PCT: 70 %
Neutro Abs: 6.4 10*3/uL (ref 1.7–7.7)
Platelets: 225 10*3/uL (ref 150–400)
RBC: 4.15 MIL/uL (ref 3.87–5.11)
RDW: 14.3 % (ref 11.5–15.5)
WBC: 9 10*3/uL (ref 4.0–10.5)
nRBC: 0 % (ref 0.0–0.2)

## 2018-03-18 LAB — COMPREHENSIVE METABOLIC PANEL
ALT: 95 U/L — ABNORMAL HIGH (ref 0–44)
AST: 48 U/L — ABNORMAL HIGH (ref 15–41)
Albumin: 2.9 g/dL — ABNORMAL LOW (ref 3.5–5.0)
Alkaline Phosphatase: 118 U/L (ref 38–126)
Anion gap: 8 (ref 5–15)
BUN: 5 mg/dL — ABNORMAL LOW (ref 6–20)
CO2: 20 mmol/L — ABNORMAL LOW (ref 22–32)
Calcium: 8.6 mg/dL — ABNORMAL LOW (ref 8.9–10.3)
Chloride: 106 mmol/L (ref 98–111)
Creatinine, Ser: 0.53 mg/dL (ref 0.44–1.00)
GFR calc Af Amer: >60 mL/min (ref >60)
GFR calc non Af Amer: >60 mL/min (ref >60)
Glucose, Bld: 94 mg/dL (ref 70–99)
Potassium: 3.4 mmol/L — ABNORMAL LOW (ref 3.5–5.1)
Sodium: 134 mmol/L — ABNORMAL LOW (ref 135–145)
Total Bilirubin: 0.6 mg/dL (ref 0.3–1.2)
Total Protein: 7.2 g/dL (ref 6.5–8.1)

## 2018-03-18 LAB — GLUCOSE, CAPILLARY: Glucose-Capillary: 87 mg/dL (ref 70–99)

## 2018-03-18 NOTE — Telephone Encounter (Signed)
Spoke to patient she states if we need her to be seen tomorrow, she can come.  Informed patient that if she was stable for discharge and was not having any bleeding, leaking or more contractions, she could wait until Friday.  Pt verbalized understanding.

## 2018-03-18 NOTE — Discharge Instructions (Signed)
.  Preeclampsia and Eclampsia    Preeclampsia is a serious condition that may develop during pregnancy. It is also called toxemia of pregnancy. This condition causes high blood pressure along with other symptoms, such as swelling and headaches. These symptoms may develop as the condition gets worse. Preeclampsia may occur at 20 weeks of pregnancy or later.  Diagnosing and treating preeclampsia early is very important. If not treated early, it can cause serious problems for you and your baby. One problem it can lead to is eclampsia. Eclampsia is a condition that causes muscle jerking or shaking (convulsions or seizures) and other serious problems for the mother. During pregnancy, delivering your baby may be the best treatment for preeclampsia or eclampsia. For most women, preeclampsia and eclampsia symptoms go away after giving birth.  In rare cases, a woman may develop preeclampsia after giving birth (postpartum preeclampsia). This usually occurs within 48 hours after childbirth but may occur up to 6 weeks after giving birth.  What are the causes?  The cause of preeclampsia is not known.  What increases the risk?  The following risk factors make you more likely to develop preeclampsia:   Being pregnant for the first time.   Having had preeclampsia during a past pregnancy.   Having a family history of preeclampsia.   Having high blood pressure.   Being pregnant with more than one baby.   Being 35 or older.   Being African-American.   Having kidney disease or diabetes.   Having medical conditions such as lupus or blood diseases.   Being very overweight (obese).  What are the signs or symptoms?  The earliest signs of preeclampsia are:   High blood pressure.   Increased protein in your urine. Your health care provider will check for this at every visit before you give birth (prenatal visit).  Other symptoms that may develop as the condition gets worse include:   Severe headaches.   Sudden weight  gain.   Swelling of the hands, face, legs, and feet.   Nausea and vomiting.   Vision problems, such as blurred or double vision.   Numbness in the face, arms, legs, and feet.   Urinating less than usual.   Dizziness.   Slurred speech.   Abdominal pain, especially upper abdominal pain.   Convulsions or seizures.  How is this diagnosed?  There are no screening tests for preeclampsia. Your health care provider will ask you about symptoms and check for signs of preeclampsia during your prenatal visits. You may also have tests that include:   Urine tests.   Blood tests.   Checking your blood pressure.   Monitoring your baby's heart rate.   Ultrasound.  How is this treated?  You and your health care provider will determine the treatment approach that is best for you. Treatment may include:   Having more frequent prenatal exams to check for signs of preeclampsia, if you have an increased risk for preeclampsia.   Medicine to lower your blood pressure.   Staying in the hospital, if your condition is severe. There, treatment will focus on controlling your blood pressure and the amount of fluids in your body (fluid retention).   Taking medicine (magnesium sulfate) to prevent seizures. This may be given as an injection or through an IV.   Taking a low-dose aspirin during your pregnancy.   Delivering your baby early, if your condition gets worse. You may have your labor started with medicine (induced), or you may have a cesarean   delivery.  Follow these instructions at home:  Eating and drinking     Drink enough fluid to keep your urine pale yellow.   Avoid caffeine.  Lifestyle   Do not use any products that contain nicotine or tobacco, such as cigarettes and e-cigarettes. If you need help quitting, ask your health care provider.   Do not use alcohol or drugs.   Avoid stress as much as possible. Rest and get plenty of sleep.  General instructions   Take over-the-counter and prescription medicines only as  told by your health care provider.   When lying down, lie on your left side. This keeps pressure off your major blood vessels.   When sitting or lying down, raise (elevate) your feet. Try putting some pillows underneath your lower legs.   Exercise regularly. Ask your health care provider what kinds of exercise are best for you.   Keep all follow-up and prenatal visits as told by your health care provider. This is important.  How is this prevented?  There is no known way of preventing preeclampsia or eclampsia from developing. However, to lower your risk of complications and detect problems early:   Get regular prenatal care. Your health care provider may be able to diagnose and treat the condition early.   Maintain a healthy weight. Ask your health care provider for help managing weight gain during pregnancy.   Work with your health care provider to manage any long-term (chronic) health conditions you have, such as diabetes or kidney problems.   You may have tests of your blood pressure and kidney function after giving birth.   Your health care provider may have you take low-dose aspirin during your next pregnancy.  Contact a health care provider if:   You have symptoms that your health care provider told you may require more treatment or monitoring, such as:  ? Headaches.  ? Nausea or vomiting.  ? Abdominal pain.  ? Dizziness.  ? Light-headedness.  Get help right away if:   You have severe:  ? Abdominal pain.  ? Headaches that do not get better.  ? Dizziness.  ? Vision problems.  ? Confusion.  ? Nausea or vomiting.   You have any of the following:  ? A seizure.  ? Sudden, rapid weight gain.  ? Sudden swelling in your hands, ankles, or face.  ? Trouble moving any part of your body.  ? Numbness in any part of your body.  ? Trouble speaking.  ? Abnormal bleeding.   You faint.  Summary   Preeclampsia is a serious condition that may develop during pregnancy. It is also called toxemia of pregnancy.   This  condition causes high blood pressure along with other symptoms, such as swelling and headaches.   Diagnosing and treating preeclampsia early is very important. If not treated early, it can cause serious problems for you and your baby.   Get help right away if you have symptoms that your health care provider told you to watch for.  This information is not intended to replace advice given to you by your health care provider. Make sure you discuss any questions you have with your health care provider.  Document Released: 01/12/2000 Document Revised: 12/31/2016 Document Reviewed: 08/21/2015  Elsevier Interactive Patient Education  2019 Elsevier Inc.

## 2018-03-18 NOTE — Telephone Encounter (Signed)
Patient called, stated that she fell Monday night, went to [facility] and they kept her for observation.  She stated that they told her to call and see if she needs to be seen tomorrow or keep her appointment on Friday, 03/20/18.  [phone]

## 2018-03-18 NOTE — Discharge Summary (Addendum)
Antenatal Physician Discharge Summary  Patient ID: Mary Hull MRN: [id number] DOB/AGE: 18-Sep-1995 23 y.o.  Admit date: 03/16/2018 Discharge date: 03/18/2018  Admission Diagnoses: abdominal trauma  Discharge Diagnoses:  Principal Problem:   Abdominal trauma Active Problems:   History of gestational diabetes   Supervision of high risk pregnancy, antepartum   Gestational diabetes mellitus, class A2   Kidney abnormality of fetus on prenatal ultrasound   Trauma during pregnancy   Elevated liver enzymes  Prenatal Procedures: NST  Consults: Maternal Fetal Medicine  Hospital Course:  This is a 23 y.o. G2P1001 with IUP at [redacted]w[redacted]d admitted for abdominal trauma after fall off bed with contractions. Monitored for 24 hrs with reactive NST. Has had few contractions since admission. Currently feeling well and she denies contractions, leaking or bleeding, reports normal fetal movement. BP monitored and is slightly elevated from baseline but does not meet criteria for gestational HTN. With elevated AST/ALT and negative hepatitis panel, negative bile acids on arrival. Repeat AST/ALT slightly decreased but not within normal limits. Patient does not meet criteria for admission for HELLP, preeclampsia. Will discharge to home with close followup, has appointment scheduled for Friday. Reviewed precautions and to return to hospital with any headache, RUQ pain, decreased fetal movement, changes in vision, bleeding, contractions, leaking, labor. Plan for induction at 39 weeks based on gDMA2, has 2x/weekly testing set up.   Discharge Exam: Temp:  [98 F (36.7 C)-98.7 F (37.1 C)] 98.7 F (37.1 C) (02/19 0811) Pulse Rate:  [80-108] 108 (02/19 0811) Resp:  [17-18] 18 (02/19 0811) BP: (125-133)/(76-87) 132/86 (02/19 0811) SpO2:  [95 %-98 %] 95 % (02/19 0811) Physical Examination: CONSTITUTIONAL: Well-developed, well-nourished female in no acute distress.  HENT:  Normocephalic, atraumatic, External  right and left ear normal. Oropharynx is clear and moist EYES: Conjunctivae and EOM are normal. Pupils are equal, round, and reactive to light. No scleral icterus.  NECK: Normal range of motion, supple, no masses SKIN: Skin is warm and dry. No rash noted. Not diaphoretic. No erythema. No pallor. NEUROLGIC: Alert and oriented to person, place, and time. Normal reflexes, muscle tone coordination. No cranial nerve deficit noted. PSYCHIATRIC: Normal mood and affect. Normal behavior. Normal judgment and thought content. CARDIOVASCULAR: Normal heart rate noted, regular rhythm RESPIRATORY: Effort and breath sounds normal, no problems with respiration noted MUSCULOSKELETAL: Normal range of motion. No edema and no tenderness. 2+ distal pulses. ABDOMEN: Soft, nontender, nondistended, gravid. CERVIX: Dilation: 4 Effacement (%): 60 Cervical Position: Posterior Station: -3 Presentation: Vertex Exam by:: K. WeissRN  Fetal monitoring: FHR: 145 bpm, Variability: moderate, Accelerations: Present, Decelerations: Absent  Uterine activity: no contractions per hour  Significant Diagnostic Studies:  Results for orders placed or performed during the hospital encounter of 03/16/18 (from the past 168 hour(s))  CBC   Collection Time: 03/17/18  3:30 AM  Result Value Ref Range   WBC 8.1 4.0 - 10.5 K/uL   RBC 3.85 (L) 3.87 - 5.11 MIL/uL   Hemoglobin 11.0 (L) 12.0 - 15.0 g/dL   HCT 40.9 (L) 81.1 - 91.4 %   MCV 87.3 80.0 - 100.0 fL   MCH 28.6 26.0 - 34.0 pg   MCHC 32.7 30.0 - 36.0 g/dL   RDW 78.2 95.6 - 21.3 %   Platelets 233 150 - 400 K/uL   nRBC 0.0 0.0 - 0.2 %  Type and screen [facility]   Collection Time: 03/17/18  3:30 AM  Result Value Ref Range   ABO/RH(D) A POS  Antibody Screen NEG    Sample Expiration      03/20/2018 Performed at [facility], [address], [place], [place] [address]   Glucose, capillary   Collection Time: 03/17/18  7:55 PM  Result Value Ref Range    Glucose-Capillary 109 (H) 70 - 99 mg/dL  Glucose, capillary   Collection Time: 03/17/18 11:36 PM  Result Value Ref Range   Glucose-Capillary 72 70 - 99 mg/dL  Glucose, capillary   Collection Time: 03/18/18  6:05 AM  Result Value Ref Range   Glucose-Capillary 87 70 - 99 mg/dL  CBC with Differential/Platelet   Collection Time: 03/18/18  9:56 AM  Result Value Ref Range   WBC 9.0 4.0 - 10.5 K/uL   RBC 4.15 3.87 - 5.11 MIL/uL   Hemoglobin 11.8 (L) 12.0 - 15.0 g/dL   HCT 60.4 54.0 - 98.1 %   MCV 87.0 80.0 - 100.0 fL   MCH 28.4 26.0 - 34.0 pg   MCHC 32.7 30.0 - 36.0 g/dL   RDW 19.1 47.8 - 29.5 %   Platelets 225 150 - 400 K/uL   nRBC 0.0 0.0 - 0.2 %   Neutrophils Relative % 70 %   Neutro Abs 6.4 1.7 - 7.7 K/uL   Lymphocytes Relative 24 %   Lymphs Abs 2.1 0.7 - 4.0 K/uL   Monocytes Relative 5 %   Monocytes Absolute 0.4 0.1 - 1.0 K/uL   Eosinophils Relative 1 %   Eosinophils Absolute 0.1 0.0 - 0.5 K/uL   Basophils Relative 0 %   Basophils Absolute 0.0 0.0 - 0.1 K/uL  Comprehensive metabolic panel   Collection Time: 03/18/18  9:56 AM  Result Value Ref Range   Sodium 134 (L) 135 - 145 mmol/L   Potassium 3.4 (L) 3.5 - 5.1 mmol/L   Chloride 106 98 - 111 mmol/L   CO2 20 (L) 22 - 32 mmol/L   Glucose, Bld 94 70 - 99 mg/dL   BUN 5 (L) 6 - 20 mg/dL   Creatinine, Ser 6.21 0.44 - 1.00 mg/dL   Calcium 8.6 (L) 8.9 - 10.3 mg/dL   Total Protein 7.2 6.5 - 8.1 g/dL   Albumin 2.9 (L) 3.5 - 5.0 g/dL   AST 48 (H) 15 - 41 U/L   ALT 95 (H) 0 - 44 U/L   Alkaline Phosphatase 118 38 - 126 U/L   Total Bilirubin 0.6 0.3 - 1.2 mg/dL   GFR calc non Af Amer >60 >60 mL/min   GFR calc Af Amer >60 >60 mL/min   Anion gap 8 5 - 15  Results for orders placed or performed during the hospital encounter of 03/14/18 (from the past 168 hour(s))  Protein / creatinine ratio, urine   Collection Time: 03/14/18  5:34 PM  Result Value Ref Range   Creatinine, Urine 140.00 mg/dL   Total Protein, Urine 44 mg/dL    Protein Creatinine Ratio 0.31 (H) 0.00 - 0.15 mg/mg[Cre]  Urinalysis, Routine w reflex microscopic   Collection Time: 03/14/18  5:34 PM  Result Value Ref Range   Color, Urine YELLOW YELLOW   APPearance CLEAR CLEAR   Specific Gravity, Urine 1.015 1.005 - 1.030   pH 5.5 5.0 - 8.0   Glucose, UA NEGATIVE NEGATIVE mg/dL   Hgb urine dipstick NEGATIVE NEGATIVE   Bilirubin Urine SMALL (A) NEGATIVE   Ketones, ur 40 (A) NEGATIVE mg/dL   Protein, ur NEGATIVE NEGATIVE mg/dL   Nitrite NEGATIVE NEGATIVE   Leukocytes,Ua MODERATE (A) NEGATIVE  Urinalysis, Microscopic (  reflex)   Collection Time: 03/14/18  5:34 PM  Result Value Ref Range   RBC / HPF 0-5 0 - 5 RBC/hpf   WBC, UA 6-10 0 - 5 WBC/hpf   Bacteria, UA FEW (A) NONE SEEN   Squamous Epithelial / LPF 0-5 0 - 5  CBC   Collection Time: 03/14/18  5:44 PM  Result Value Ref Range   WBC 8.1 4.0 - 10.5 K/uL   RBC 4.06 3.87 - 5.11 MIL/uL   Hemoglobin 11.5 (L) 12.0 - 15.0 g/dL   HCT 16.135.3 (L) 09.636.0 - 04.546.0 %   MCV 86.9 80.0 - 100.0 fL   MCH 28.3 26.0 - 34.0 pg   MCHC 32.6 30.0 - 36.0 g/dL   RDW 40.914.0 81.111.5 - 91.415.5 %   Platelets 235 150 - 400 K/uL   nRBC 0.0 0.0 - 0.2 %  Comprehensive metabolic panel   Collection Time: 03/14/18  5:44 PM  Result Value Ref Range   Sodium 134 (L) 135 - 145 mmol/L   Potassium 3.6 3.5 - 5.1 mmol/L   Chloride 105 98 - 111 mmol/L   CO2 18 (L) 22 - 32 mmol/L   Glucose, Bld 68 (L) 70 - 99 mg/dL   BUN 7 6 - 20 mg/dL   Creatinine, Ser 7.820.52 0.44 - 1.00 mg/dL   Calcium 8.6 (L) 8.9 - 10.3 mg/dL   Total Protein 7.1 6.5 - 8.1 g/dL   Albumin 3.1 (L) 3.5 - 5.0 g/dL   AST 63 (H) 15 - 41 U/L   ALT 123 (H) 0 - 44 U/L   Alkaline Phosphatase 117 38 - 126 U/L   Total Bilirubin 0.7 0.3 - 1.2 mg/dL   GFR calc non Af Amer >60 >60 mL/min   GFR calc Af Amer >60 >60 mL/min   Anion gap 11 5 - 15  Bile acids, total   Collection Time: 03/14/18  5:44 PM  Result Value Ref Range   Bile Acids Total 2.0 0.0 - 10.0 umol/L  Hepatitis panel,  acute   Collection Time: 03/14/18  5:44 PM  Result Value Ref Range   Hepatitis B Surface Ag Negative Negative   HCV Ab <0.1 0.0 - 0.9 s/co ratio   Hep A IgM Negative Negative   Hep B C IgM Negative Negative  Fibrinogen   Collection Time: 03/14/18  5:44 PM  Result Value Ref Range   Fibrinogen 580 (H) 210 - 475 mg/dL  APTT   Collection Time: 03/14/18  5:44 PM  Result Value Ref Range   aPTT 30 24 - 36 seconds  Protime-INR   Collection Time: 03/14/18  5:44 PM  Result Value Ref Range   Prothrombin Time 12.6 11.4 - 15.2 seconds   INR 0.95   Results for orders placed or performed in visit on 03/13/18 (from the past 168 hour(s))  POC Urinalysis Dipstick OB   Collection Time: 03/13/18  9:22 AM  Result Value Ref Range   Color, UA     Clarity, UA     Glucose, UA Negative Negative   Bilirubin, UA     Ketones, UA moderate    Spec Grav, UA     Blood, UA neg    pH, UA     POC,PROTEIN,UA Trace Negative, Trace, Small (1+), Moderate (2+), Large (3+), 4+   Urobilinogen, UA     Nitrite, UA neg    Leukocytes, UA Negative Negative   Appearance     Odor    Comprehensive metabolic panel  Collection Time: 03/13/18 10:22 AM  Result Value Ref Range   Glucose 76 65 - 99 mg/dL   BUN 7 6 - 20 mg/dL   Creatinine, Ser 0.25 (L) 0.57 - 1.00 mg/dL   GFR calc non Af Amer 146 >59 mL/min/1.73   GFR calc Af Amer 168 >59 mL/min/1.73   BUN/Creatinine Ratio 16 9 - 23   Sodium 138 134 - 144 mmol/L   Potassium 4.4 3.5 - 5.2 mmol/L   Chloride 105 96 - 106 mmol/L   CO2 20 20 - 29 mmol/L   Calcium 9.3 8.7 - 10.2 mg/dL   Total Protein 6.3 6.0 - 8.5 g/dL   Albumin 3.7 (L) 3.9 - 5.0 g/dL   Globulin, Total 2.6 1.5 - 4.5 g/dL   Albumin/Globulin Ratio 1.4 1.2 - 2.2   Bilirubin Total 0.4 0.0 - 1.2 mg/dL   Alkaline Phosphatase 138 (H) 39 - 117 IU/L   AST 61 (H) 0 - 40 IU/L   ALT 126 (H) 0 - 32 IU/L  Protein / creatinine ratio, urine   Collection Time: 03/13/18 10:22 AM  Result Value Ref Range   Creatinine,  Urine 113.0 Not Estab. mg/dL   Protein, Ur 42.7 Not Estab. mg/dL   Protein/Creat Ratio 062 (H) 0 - 200 mg/g creat    Discharge Condition: Stable  Disposition: Discharge disposition: 01-Home or Self Care      Discharge Instructions    Discharge activity:  No Restrictions   Complete by:  As directed    Fetal Kick Count:  Lie on our left side for one hour after a meal, and count the number of times your baby kicks.  If it is less than 5 times, get up, move around and drink some juice.  Repeat the test 30 minutes later.  If it is still less than 5 kicks in an hour, notify your doctor.   Complete by:  As directed    Notify physician for a general feeling that "something is not right"   Complete by:  As directed    Notify physician for increase or change in vaginal discharge   Complete by:  As directed    Notify physician for intestinal cramps, with or without diarrhea, sometimes described as "gas pain"   Complete by:  As directed    Notify physician for leaking of fluid   Complete by:  As directed    Notify physician for low, dull backache, unrelieved by heat or Tylenol   Complete by:  As directed    Notify physician for menstrual like cramps   Complete by:  As directed    Notify physician for pelvic pressure   Complete by:  As directed    Notify physician for uterine contractions.  These may be painless and feel like the uterus is tightening or the baby is  "balling up"   Complete by:  As directed    Notify physician for vaginal bleeding   Complete by:  As directed    PRETERM LABOR:  Includes any of the follwing symptoms that occur between 20 - [redacted] weeks gestation.  If these symptoms are not stopped, preterm labor can result in preterm delivery, placing your baby at risk   Complete by:  As directed      Allergies as of 03/18/2018      Reactions   Tussionex Pennkinetic Er [hydrocod Polst-cpm Polst Er] Hives, Swelling, Other (See Comments)   Reaction:  Unspecified swelling reaction        Medication  List    TAKE these medications   glyBURIDE 5 MG tablet Commonly known as:  DIABETA Take 1/2 tablet in am  1 tablet at after supper   PRENATAL VITAMIN PO Take by mouth daily.      Follow-up Information    [facility]. Go on 03/20/2018.   Specialty:  Obstetrics and Gynecology Contact information: [address] [place] [address] [phone]          Signed: Baldemar Lenis, M.D. [facility]ending [facility] [employer] (Faculty Practice)  03/18/2018, 11:40 AM

## 2018-03-20 ENCOUNTER — Encounter: Payer: Self-pay | Admitting: Obstetrics & Gynecology

## 2018-03-20 ENCOUNTER — Ambulatory Visit (INDEPENDENT_AMBULATORY_CARE_PROVIDER_SITE_OTHER): Payer: Medicaid Other | Admitting: Obstetrics & Gynecology

## 2018-03-20 VITALS — BP 122/81 | HR 101 | Wt 200.5 lb

## 2018-03-20 DIAGNOSIS — O0990 Supervision of high risk pregnancy, unspecified, unspecified trimester: Secondary | ICD-10-CM

## 2018-03-20 DIAGNOSIS — O0993 Supervision of high risk pregnancy, unspecified, third trimester: Secondary | ICD-10-CM

## 2018-03-20 DIAGNOSIS — O24419 Gestational diabetes mellitus in pregnancy, unspecified control: Secondary | ICD-10-CM | POA: Diagnosis not present

## 2018-03-20 DIAGNOSIS — Z3A38 38 weeks gestation of pregnancy: Secondary | ICD-10-CM | POA: Diagnosis not present

## 2018-03-20 DIAGNOSIS — Z331 Pregnant state, incidental: Secondary | ICD-10-CM

## 2018-03-20 DIAGNOSIS — R748 Abnormal levels of other serum enzymes: Secondary | ICD-10-CM | POA: Diagnosis not present

## 2018-03-20 DIAGNOSIS — Z1389 Encounter for screening for other disorder: Secondary | ICD-10-CM

## 2018-03-20 LAB — POCT URINALYSIS DIPSTICK OB
Blood, UA: NEGATIVE
Glucose, UA: NEGATIVE
Nitrite, UA: NEGATIVE

## 2018-03-20 NOTE — Treatment Plan (Signed)
   Induction Assessment Scheduling Form: Fax to [facility]:  [phone]  Mary Hull                                                                                   DOB:  1995/04/15                                                            MRN:  [id number]                                                                     Phone #:   [phone]                         Provider:  [facility]  GP:  G2P1001                                                            Estimated Date of Delivery: 04/01/18  Dating Criteria: early sonogram dates    Medical Indications for induction:  Class A2 DM at 39 weeks Admission Date/Time:  03/25/2018@0630  Gestational age on admission:  [redacted]w[redacted]d   Filed Weights   03/20/18 1128  Weight: 200 lb 8 oz (90.9 kg)   HIV:  Non Reactive (12/06 0909) MEQ:ASTMHDQQ    4 cm dilated   Method of induction(proposed):  pitocin   Scheduling Provider Signature:  Lazaro Arms, MD                                            Today's Date:  03/20/2018

## 2018-03-20 NOTE — Progress Notes (Signed)
   HIGH-RISK PREGNANCY VISIT Patient name: Mary Hull MRN [id number]  Date of birth: September 13, 1995 Chief Complaint:   High Risk Gestation (NST)  History of Present Illness:   Mary Hull is a 23 y.o. G94P1001 female at [redacted]w[redacted]d with an Estimated Date of Delivery: 04/01/18 being seen today for ongoing management of a high-risk pregnancy complicated by class  A2 DM.  Today she reports no complaints. Contractions: Irregular. Vag. Bleeding: None.  Movement: Present. denies leaking of fluid.  Review of Systems:   Pertinent items are noted in HPI Denies abnormal vaginal discharge w/ itching/odor/irritation, headaches, visual changes, shortness of breath, chest pain, abdominal pain, severe nausea/vomiting, or problems with urination or bowel movements unless otherwise stated above. Pertinent History Reviewed:  Reviewed past medical,surgical, social, obstetrical and family history.  Reviewed problem list, medications and allergies. Physical Assessment:   Vitals:   03/20/18 1128  BP: 122/81  Pulse: (!) 101  Weight: 200 lb 8 oz (90.9 kg)  Body mass index is 37.88 kg/m.           Physical Examination:   General appearance: alert, well appearing, and in no distress  Mental status: alert, oriented to person, place, and time  Skin: warm & dry   Extremities: Edema: None    Cardiovascular: normal heart rate noted  Respiratory: normal respiratory effort, no distress  Abdomen: gravid, soft, non-tender  Pelvic: Cervical exam deferred         Fetal Status:     Movement: Present    Fetal Surveillance Testing today: Reactive NST   Results for orders placed or performed in visit on 03/20/18 (from the past 24 hour(s))  POC Urinalysis Dipstick OB   Collection Time: 03/20/18 11:29 AM  Result Value Ref Range   Color, UA     Clarity, UA     Glucose, UA Negative Negative   Bilirubin, UA     Ketones, UA 4+    Spec Grav, UA     Blood, UA neg    pH, UA     POC,PROTEIN,UA Small (1+) Negative,  Trace, Small (1+), Moderate (2+), Large (3+), 4+   Urobilinogen, UA     Nitrite, UA neg    Leukocytes, UA Small (1+) (A) Negative   Appearance     Odor      Assessment & Plan:  1) High-risk pregnancy G2P1001 at [redacted]w[redacted]d with an Estimated Date of Delivery: 04/01/18   2) Class A2 DM, stable, glyburide 2.5 mg in AM, 5 mg in PM  3) Fetal RPD, stable, 55mm  Meds: No orders of the defined types were placed in this encounter.   Labs/procedures today: Reactive NST  Treatment Plan:  IOL 39 weeks, scheduled 03/25/2018@0630   Reviewed: Term labor symptoms and general obstetric precautions including but not limited to vaginal bleeding, contractions, leaking of fluid and fetal movement were reviewed in detail with the patient.  All questions were answered.  Follow-up: Return in about 6 weeks (around 05/01/2018) for post partum visit.  Orders Placed This Encounter  Procedures  . POC Urinalysis Dipstick OB   Lazaro Arms  03/20/2018 12:15 PM

## 2018-03-23 ENCOUNTER — Encounter (HOSPITAL_COMMUNITY): Payer: Self-pay | Admitting: *Deleted

## 2018-03-23 ENCOUNTER — Telehealth (HOSPITAL_COMMUNITY): Payer: Self-pay | Admitting: *Deleted

## 2018-03-23 NOTE — Telephone Encounter (Signed)
Preadmission screen  

## 2018-03-24 ENCOUNTER — Other Ambulatory Visit: Payer: Medicaid Other | Admitting: Women's Health

## 2018-03-25 ENCOUNTER — Inpatient Hospital Stay (HOSPITAL_COMMUNITY): Payer: Medicaid Other | Admitting: Anesthesiology

## 2018-03-25 ENCOUNTER — Inpatient Hospital Stay (HOSPITAL_COMMUNITY)
Admission: AD | Admit: 2018-03-25 | Discharge: 2018-03-27 | DRG: 807 | Disposition: A | Discharge status: Home / Self Care | Payer: Medicaid Other | Attending: Obstetrics and Gynecology | Admitting: Obstetrics and Gynecology

## 2018-03-25 ENCOUNTER — Encounter (HOSPITAL_COMMUNITY): Payer: Self-pay | Admitting: *Deleted

## 2018-03-25 ENCOUNTER — Inpatient Hospital Stay (HOSPITAL_COMMUNITY)
Admission: RE | Admit: 2018-03-25 | Discharge: 2018-03-25 | Disposition: A | Discharge status: Home / Self Care | Payer: Medicaid Other | Source: Ambulatory Visit | Attending: Internal Medicine | Admitting: Internal Medicine

## 2018-03-25 ENCOUNTER — Other Ambulatory Visit: Payer: Self-pay

## 2018-03-25 DIAGNOSIS — O24425 Gestational diabetes mellitus in childbirth, controlled by oral hypoglycemic drugs: Principal | ICD-10-CM | POA: Diagnosis present

## 2018-03-25 DIAGNOSIS — O99334 Smoking (tobacco) complicating childbirth: Secondary | ICD-10-CM | POA: Diagnosis present

## 2018-03-25 DIAGNOSIS — O26893 Other specified pregnancy related conditions, third trimester: Secondary | ICD-10-CM | POA: Diagnosis present

## 2018-03-25 DIAGNOSIS — J45909 Unspecified asthma, uncomplicated: Secondary | ICD-10-CM | POA: Diagnosis present

## 2018-03-25 DIAGNOSIS — R7989 Other specified abnormal findings of blood chemistry: Secondary | ICD-10-CM | POA: Diagnosis present

## 2018-03-25 DIAGNOSIS — O24419 Gestational diabetes mellitus in pregnancy, unspecified control: Secondary | ICD-10-CM

## 2018-03-25 DIAGNOSIS — Z3A39 39 weeks gestation of pregnancy: Secondary | ICD-10-CM

## 2018-03-25 DIAGNOSIS — Z8632 Personal history of gestational diabetes: Secondary | ICD-10-CM | POA: Diagnosis present

## 2018-03-25 DIAGNOSIS — F17210 Nicotine dependence, cigarettes, uncomplicated: Secondary | ICD-10-CM | POA: Diagnosis present

## 2018-03-25 DIAGNOSIS — O0990 Supervision of high risk pregnancy, unspecified, unspecified trimester: Secondary | ICD-10-CM

## 2018-03-25 DIAGNOSIS — O9952 Diseases of the respiratory system complicating childbirth: Secondary | ICD-10-CM | POA: Diagnosis present

## 2018-03-25 DIAGNOSIS — R748 Abnormal levels of other serum enzymes: Secondary | ICD-10-CM

## 2018-03-25 DIAGNOSIS — O358XX0 Maternal care for other (suspected) fetal abnormality and damage, not applicable or unspecified: Secondary | ICD-10-CM | POA: Diagnosis present

## 2018-03-25 DIAGNOSIS — O35EXX0 Maternal care for other (suspected) fetal abnormality and damage, fetal genitourinary anomalies, not applicable or unspecified: Secondary | ICD-10-CM | POA: Diagnosis present

## 2018-03-25 LAB — CBC
HCT: 35.1 % — ABNORMAL LOW (ref 36.0–46.0)
HEMOGLOBIN: 11.2 g/dL — AB (ref 12.0–15.0)
MCH: 27 pg (ref 26.0–34.0)
MCHC: 31.9 g/dL (ref 30.0–36.0)
MCV: 84.6 fL (ref 80.0–100.0)
Platelets: 257 10*3/uL (ref 150–400)
RBC: 4.15 MIL/uL (ref 3.87–5.11)
RDW: 14.2 % (ref 11.5–15.5)
WBC: 7.8 10*3/uL (ref 4.0–10.5)
nRBC: 0 % (ref 0.0–0.2)

## 2018-03-25 LAB — COMPREHENSIVE METABOLIC PANEL
ALT: 188 U/L — ABNORMAL HIGH (ref 0–44)
AST: 138 U/L — ABNORMAL HIGH (ref 15–41)
Albumin: 2.3 g/dL — ABNORMAL LOW (ref 3.5–5.0)
Alkaline Phosphatase: 146 U/L — ABNORMAL HIGH (ref 38–126)
Anion gap: 10 (ref 5–15)
BUN: 5 mg/dL — ABNORMAL LOW (ref 6–20)
CO2: 18 mmol/L — ABNORMAL LOW (ref 22–32)
Calcium: 8.9 mg/dL (ref 8.9–10.3)
Chloride: 105 mmol/L (ref 98–111)
Creatinine, Ser: 0.53 mg/dL (ref 0.44–1.00)
GFR calc Af Amer: >60 mL/min (ref >60)
GFR calc non Af Amer: >60 mL/min (ref >60)
Glucose, Bld: 75 mg/dL (ref 70–99)
Potassium: 4.1 mmol/L (ref 3.5–5.1)
Sodium: 133 mmol/L — ABNORMAL LOW (ref 135–145)
Total Bilirubin: 0.4 mg/dL (ref 0.3–1.2)
Total Protein: 5.7 g/dL — ABNORMAL LOW (ref 6.5–8.1)

## 2018-03-25 LAB — GLUCOSE, CAPILLARY
Glucose-Capillary: 79 mg/dL (ref 70–99)
Glucose-Capillary: 67 mg/dL — ABNORMAL LOW (ref 70–99)
Glucose-Capillary: 67 mg/dL — ABNORMAL LOW (ref 70–99)
Glucose-Capillary: 80 mg/dL (ref 70–99)
Glucose-Capillary: 69 mg/dL — ABNORMAL LOW (ref 70–99)
Glucose-Capillary: 76 mg/dL (ref 70–99)

## 2018-03-25 LAB — TYPE AND SCREEN
ABO/RH(D): A POS
ANTIBODY SCREEN: NEGATIVE

## 2018-03-25 LAB — ABO/RH: ABO/RH(D): A POS

## 2018-03-25 MED ORDER — PHENYLEPHRINE 40 MCG/ML (10ML) SYRINGE FOR IV PUSH (FOR BLOOD PRESSURE SUPPORT)
80.0000 ug | PREFILLED_SYRINGE | INTRAVENOUS | Status: DC | PRN
  Filled 2018-03-25 (×2): qty 10

## 2018-03-25 MED ORDER — OXYCODONE-ACETAMINOPHEN 5-325 MG PO TABS: 2.0000 | ORAL_TABLET | ORAL | Status: DC | PRN

## 2018-03-25 MED ORDER — EPHEDRINE 5 MG/ML INJ
10.0000 mg | INTRAVENOUS | Status: DC | PRN
  Filled 2018-03-25: qty 2

## 2018-03-25 MED ORDER — PHENYLEPHRINE 40 MCG/ML (10ML) SYRINGE FOR IV PUSH (FOR BLOOD PRESSURE SUPPORT)
80.0000 ug | PREFILLED_SYRINGE | INTRAVENOUS | Status: DC | PRN
  Filled 2018-03-25: qty 10

## 2018-03-25 MED ORDER — LIDOCAINE HCL (PF) 1 % IJ SOLN
30.0000 mL | INTRAMUSCULAR | Status: DC | PRN
  Administered 2018-03-26: 30 mL via SUBCUTANEOUS
  Filled 2018-03-25: qty 30

## 2018-03-25 MED ORDER — ONDANSETRON HCL 4 MG/2ML IJ SOLN: 4.0000 mg | Freq: Four times a day (QID) | INTRAMUSCULAR | Status: DC | PRN

## 2018-03-25 MED ORDER — OXYTOCIN BOLUS FROM INFUSION
500.0000 mL | Freq: Once | INTRAVENOUS | Status: AC
  Administered 2018-03-26: 500 mL via INTRAVENOUS

## 2018-03-25 MED ORDER — LACTATED RINGERS IV SOLN
INTRAVENOUS | Status: DC
  Administered 2018-03-25 (×2): via INTRAVENOUS

## 2018-03-25 MED ORDER — DIPHENHYDRAMINE HCL 50 MG/ML IJ SOLN: 12.5000 mg | INTRAMUSCULAR | Status: DC | PRN

## 2018-03-25 MED ORDER — OXYTOCIN 40 UNITS IN NORMAL SALINE INFUSION - SIMPLE MED
1.0000 m[IU]/min | INTRAVENOUS | Status: DC
  Administered 2018-03-25: 2 m[IU]/min via INTRAVENOUS
  Filled 2018-03-25: qty 1000

## 2018-03-25 MED ORDER — SODIUM CHLORIDE (PF) 0.9 % IJ SOLN
INTRAMUSCULAR | Status: DC | PRN
  Administered 2018-03-25: 10 mL/h via EPIDURAL

## 2018-03-25 MED ORDER — ACETAMINOPHEN 325 MG PO TABS: 650.0000 mg | ORAL_TABLET | ORAL | Status: DC | PRN

## 2018-03-25 MED ORDER — SOD CITRATE-CITRIC ACID 500-334 MG/5ML PO SOLN: 30.0000 mL | ORAL | Status: DC | PRN

## 2018-03-25 MED ORDER — LACTATED RINGERS IV SOLN: 500.0000 mL | INTRAVENOUS | Status: DC | PRN

## 2018-03-25 MED ORDER — OXYTOCIN 40 UNITS IN NORMAL SALINE INFUSION - SIMPLE MED
2.5000 [IU]/h | INTRAVENOUS | Status: DC
  Administered 2018-03-26: 2.5 [IU]/h via INTRAVENOUS

## 2018-03-25 MED ORDER — LIDOCAINE HCL (PF) 1 % IJ SOLN
INTRAMUSCULAR | Status: DC | PRN
  Administered 2018-03-25 (×2): 5 mL via EPIDURAL

## 2018-03-25 MED ORDER — OXYCODONE-ACETAMINOPHEN 5-325 MG PO TABS: 1.0000 | ORAL_TABLET | ORAL | Status: DC | PRN

## 2018-03-25 MED ORDER — TERBUTALINE SULFATE 1 MG/ML IJ SOLN: 0.2500 mg | Freq: Once | INTRAMUSCULAR | Status: DC | PRN

## 2018-03-25 MED ORDER — LACTATED RINGERS IV SOLN
500.0000 mL | Freq: Once | INTRAVENOUS | Status: AC
  Administered 2018-03-25: 500 mL via INTRAVENOUS

## 2018-03-25 MED ORDER — FENTANYL-BUPIVACAINE-NACL 0.5-0.125-0.9 MG/250ML-% EP SOLN
12.0000 mL/h | EPIDURAL | Status: DC | PRN
  Filled 2018-03-25: qty 250

## 2018-03-25 NOTE — Anesthesia Procedure Notes (Signed)
Epidural Patient location during procedure: OB Start time: 03/25/2018 7:33 PM End time: 03/25/2018 7:54 PM  Staffing Anesthesiologist: Jairo Ben, MD Performed: anesthesiologist   Preanesthetic Checklist Completed: patient identified, surgical consent, pre-op evaluation, timeout performed, IV checked, risks and benefits discussed and monitors and equipment checked  Epidural Patient position: sitting Prep: site prepped and draped and DuraPrep Patient monitoring: blood pressure, continuous pulse ox and heart rate Approach: midline Location: L3-L4 Injection technique: LOR air  Needle:  Needle type: Tuohy  Needle gauge: 17 G Needle length: 9 cm Needle insertion depth: 5.5 cm Catheter type: closed end flexible Catheter size: 19 Gauge Catheter at skin depth: 11 cm Test dose: negative (1% lidocaine)  Assessment Events: blood not aspirated, injection not painful, no injection resistance, negative IV test and no paresthesia  Additional Notes Pt identified in Labor room.  Monitors applied. Working IV access confirmed. Sterile prep, drape lumbar spine.  1% lido local L 3,4.  #17ga Touhy LOR air at 5.5 cm L 3,5, cath in easily to 11 cm skin. Test dose OK, cath dosed and infusion begun.  Patient asymptomatic, VSS, no heme aspirated, tolerated well.  Sandford Craze, MDReason for block:procedure for pain

## 2018-03-25 NOTE — Progress Notes (Signed)
Blood glucose 69. Gave patient 4oz of cranberry juice. Will recheck CBG level in 15 mins.

## 2018-03-25 NOTE — Anesthesia Preprocedure Evaluation (Addendum)
Anesthesia Evaluation  Patient identified by MRN, date of birth, ID band Patient awake    Reviewed: Allergy & Precautions, NPO status , Patient's Chart, lab work & pertinent test results  History of Anesthesia Complications Negative for: history of anesthetic complications  Airway Mallampati: II  TM Distance: >3 FB Neck ROM: Full    Dental  (+) Dental Advisory Given   Pulmonary asthma (inhalers) , Current Smoker (recently quit),    breath sounds clear to auscultation       Cardiovascular negative cardio ROS   Rhythm:Regular Rate:Normal     Neuro/Psych Depression    GI/Hepatic Neg liver ROS, GERD  ,Elevated LFTs   Endo/Other  diabetes (glu 80), Gestational, Oral Hypoglycemic Agents  Renal/GU negative Renal ROS     Musculoskeletal   Abdominal   Peds  Hematology plt 257k   Anesthesia Other Findings   Reproductive/Obstetrics (+) Pregnancy                            Anesthesia Physical Anesthesia Plan  ASA: III  Anesthesia Plan: Epidural   Post-op Pain Management:    Induction:   PONV Risk Score and Plan: 1 and Treatment may vary due to age or medical condition  Airway Management Planned: Natural Airway  Additional Equipment:   Intra-op Plan:   Post-operative Plan:   Informed Consent: I have reviewed the patients History and Physical, chart, labs and discussed the procedure including the risks, benefits and alternatives for the proposed anesthesia with the patient or authorized representative who has indicated his/her understanding and acceptance.     Dental advisory given  Plan Discussed with:   Anesthesia Plan Comments: (Patient identified. Risks/Benefits/Options discussed with patient including but not limited to bleeding, infection, nerve damage, paralysis, failed block, incomplete pain control, headache, blood pressure changes, nausea, vomiting, reactions to medication  both or allergic, itching and postpartum back pain. Confirmed with bedside nurse the patient's most recent platelet count. Confirmed with patient that they are not currently taking any anticoagulation, have any bleeding history or any family history of bleeding disorders. Patient expressed understanding and wished to proceed. All questions were answered. )       Anesthesia Quick Evaluation

## 2018-03-25 NOTE — H&P (Addendum)
OBSTETRIC ADMISSION HISTORY AND PHYSICAL  Mary Hull is a 23 y.o. female G2P1001 with IUP at [redacted]w[redacted]d by LMP c/w 7wk u/s presenting for IOL. She reports +FMs, No LOF, no VB, no blurry vision, headaches or peripheral edema, and RUQ pain.  She plans on bottle feeding. She is considering vasectomy or BTL for birth control. She received her prenatal care at [facility]   Dating: By LMP --->  Estimated Date of Delivery: 04/01/18  Sono:    , CWD, left hydronephrosis, cephalic presentation, 2894 g, 52% EFW   Prenatal History/Complications: Gestational Diabetes class A2 controlled with glyburide q5hrs  Kidney abnormality of fetus on ultrasound: left renal pelvic dilation (12 mm) w/ dilated ureter -H/o PP depression  -Elevated LFTs (AST 48, ALT 95 on 2/19) with normal platelets and BPs   Past Medical History: Past Medical History:  Diagnosis Date  . Asthma   . Gallbladder sludge   . Gestational diabetes    diet controlled  . Postpartum depression     Past Surgical History: Past Surgical History:  Procedure Laterality Date  . ADENOIDECTOMY    . FRACTURE SURGERY     L elbow  . TONSILLECTOMY      Obstetrical History: OB History    Gravida  2   Para  1   Term  1   Preterm      AB      Living  1     SAB      TAB      Ectopic      Multiple  0   Live Births  1           Social History: Social History   Socioeconomic History  . Marital status: Married    Spouse name: Not on file  . Number of children: 1  . Years of education: Not on file  . Highest education level: Not on file  Occupational History  . Not on file  Social Needs  . Financial resource strain: Not hard at all  . Food insecurity:    Worry: Never true    Inability: Never true  . Transportation needs:    Medical: No    Non-medical: Not on file  Tobacco Use  . Smoking status: Current Some Day Smoker    Years: 1.00    Types: Cigarettes  . Smokeless tobacco: Never Used   Substance and Sexual Activity  . Alcohol use: No  . Drug use: No  . Sexual activity: Yes    Birth control/protection: None  Lifestyle  . Physical activity:    Days per week: Not on file    Minutes per session: Not on file  . Stress: To some extent  Relationships  . Social connections:    Talks on phone: Not on file    Gets together: Not on file    Attends religious service: Not on file    Active member of club or organization: Not on file    Attends meetings of clubs or organizations: Not on file    Relationship status: Not on file  Other Topics Concern  . Not on file  Social History Narrative  . Not on file    Family History: Family History  Problem Relation Age of Onset  . Hypertension Maternal Grandmother   . Hypertension Maternal Aunt     Allergies: Allergies  Allergen Reactions  . Tussionex Pennkinetic Er [Hydrocod Polst-Cpm Polst Er] Hives, Swelling and Other (See Comments)    Reaction:  Unspecified swelling reaction     Medications Prior to Admission  Medication Sig Dispense Refill Last Dose  . albuterol (PROVENTIL HFA;VENTOLIN HFA) 108 (90 Base) MCG/ACT inhaler Inhale 1-2 puffs into the lungs every 6 (six) hours as needed for wheezing or shortness of breath.   as needed  . glyBURIDE (DIABETA) 5 MG tablet Take 1/2 tablet in am  1 tablet at after supper 60 tablet 1 03/25/2018  . Prenatal Vit-Fe Fumarate-FA (PRENATAL VITAMIN PO) Take by mouth daily.   03/25/2018     Review of Systems   All systems reviewed and negative except as stated in HPI  Blood pressure 123/74, pulse (!) 101, temperature 98.6 F (37 C), temperature source Oral, resp. rate 18, height 5\' 1"  (1.549 m), weight 90.9 kg, last menstrual period 06/25/2017, not currently breastfeeding. General appearance: alert, cooperative, appears stated age and no distress Lungs: clear to auscultation bilaterally Heart: regular rate and rhythm Abdomen: soft, non-tender; bowel sounds normal Extremities:  Homans sign is negative, no sign of DVT Presentation: cephalic Fetal monitoringBaseline: 130 bpm, Variability: Good {> 6 bpm), Accelerations: Reactive and Decelerations: Absent Uterine activityIntensity: mild Dilation: 4 Effacement (%): 60 Station: -3 Exam by:: stone rnc   Prenatal labs: ABO, Rh: --/--/PENDING (02/26 1429) Antibody: PENDING (02/26 1429) Rubella: 25.20 (08/01 0936) RPR: Non Reactive (12/06 0909)  HBsAg: Negative (02/15 1744)  HIV: Non Reactive (12/06 0909)  GBS: Negative (02/11 0000)  1 hr Glucola: A2 GDM  Genetic screening   Anatomy US 10/31/2017  Prenatal Transfer Tool  Maternal Diabetes: Yes:  Diabetes Type:  Insulin/Medication controlled Genetic Screening: Normal Maternal Ultrasounds/Referrals: Abnormal:  Findings:   Fetal Kidney Anomalies Fetal Ultrasounds or other Referrals:  None Maternal Substance Abuse:  No Significant Maternal Medications:  Meds include: Other:  Glyburide Significant Maternal Lab Results: None  Results for orders placed or performed during the hospital encounter of 03/25/18 (from the past 24 hour(s))  CBC   Collection Time: 03/25/18  2:29 PM  Result Value Ref Range   WBC 7.8 4.0 - 10.5 K/uL   RBC 4.15 3.87 - 5.11 MIL/uL   Hemoglobin 11.2 (L) 12.0 - 15.0 g/dL   HCT 62.9 (L) 47.6 - 54.6 %   MCV 84.6 80.0 - 100.0 fL   MCH 27.0 26.0 - 34.0 pg   MCHC 31.9 30.0 - 36.0 g/dL   RDW 50.3 54.6 - 56.8 %   Platelets 257 150 - 400 K/uL   nRBC 0.0 0.0 - 0.2 %  Type and screen   Collection Time: 03/25/18  2:29 PM  Result Value Ref Range   ABO/RH(D) PENDING    Antibody Screen PENDING    Sample Expiration      03/28/2018 Performed at [facility], [address], [place], [place] [address]   Glucose, capillary   Collection Time: 03/25/18  2:36 PM  Result Value Ref Range   Glucose-Capillary 79 70 - 99 mg/dL    Patient Active Problem List   Diagnosis Date Noted  . GDM, class A2 03/25/2018  . Elevated liver enzymes 03/18/2018   . Trauma during pregnancy 03/17/2018  . Abdominal trauma 03/17/2018  . Gallbladder sludge   . Abnormal glucose tolerance test (GTT) during pregnancy, antepartum 02/25/2018  . Gestational diabetes mellitus, class A2 01/30/2018  . Kidney abnormality of fetus on prenatal ultrasound 01/30/2018  . Supervision of high risk pregnancy, antepartum 08/28/2017  . History of gestational diabetes 06/20/2016  . Abnormal findings on diagnostic imaging of gall bladder 03/27/2016  Assessment/Plan:  Mary Hull is a 23 y.o. G2P1001 at [redacted]w[redacted]d here for IOL  #Labor: Induction. Start Pit 2x2.  #Pain: Epidural as requested #FWB: Cat I  #ID:  GBS - #MOF: bottle #MOC:considering vasectomy or BTL #Circ:  Outpatient #GDM: continue to monitor blood glucose q4h during latent labor, increase to q2 during active  #Kidney abnormality of fetus: pediatric evaluation at delivery  Francene Boyers, Student-PA  03/25/2018, 3:30 PM   OB FELLOW HISTORY AND PHYSICAL ATTESTATION  I have seen and examined this patient; I agree with above documentation in the resident's note.   Marcy Siren, D.O. OB Fellow  03/25/2018, 5:51 PM

## 2018-03-25 NOTE — Progress Notes (Signed)
Blood sugar 67. 4 oz sprite given per request. Rechecked and still 67. 4 oz juice given. Will recheck.

## 2018-03-25 NOTE — Progress Notes (Signed)
Mary Hull is a 23 y.o. G2P1001 at [redacted]w[redacted]d.  Subjective: Patient resting comfortably in bed, feels better with epidural  Objective: BP 118/80   Pulse 88   Temp 98.3 F (36.8 C) (Oral)   Resp 18   Ht 5\' 1"  (1.549 m)   Wt 90.9 kg   LMP 06/25/2017   SpO2 100%   BMI 37.88 kg/m    FHT:  FHR: 125 bpm, variability: mod,  accelerations:  present,  decelerations:  absent UC:   Q 3-1minutes, irregular Dilation: 5.5 Effacement (%): 70 Station: -3 Presentation: Vertex Exam by:: Mattel, RN  Labs: Results for orders placed or performed during the hospital encounter of 03/25/18 (from the past 24 hour(s))  CBC     Status: Abnormal   Collection Time: 03/25/18  2:29 PM  Result Value Ref Range   WBC 7.8 4.0 - 10.5 K/uL   RBC 4.15 3.87 - 5.11 MIL/uL   Hemoglobin 11.2 (L) 12.0 - 15.0 g/dL   HCT 70.4 (L) 88.8 - 91.6 %   MCV 84.6 80.0 - 100.0 fL   MCH 27.0 26.0 - 34.0 pg   MCHC 31.9 30.0 - 36.0 g/dL   RDW 94.5 03.8 - 88.2 %   Platelets 257 150 - 400 K/uL   nRBC 0.0 0.0 - 0.2 %  Comprehensive metabolic panel     Status: Abnormal   Collection Time: 03/25/18  2:29 PM  Result Value Ref Range   Sodium 133 (L) 135 - 145 mmol/L   Potassium 4.1 3.5 - 5.1 mmol/L   Chloride 105 98 - 111 mmol/L   CO2 18 (L) 22 - 32 mmol/L   Glucose, Bld 75 70 - 99 mg/dL   BUN 5 (L) 6 - 20 mg/dL   Creatinine, Ser 8.00 0.44 - 1.00 mg/dL   Calcium 8.9 8.9 - 34.9 mg/dL   Total Protein 5.7 (L) 6.5 - 8.1 g/dL   Albumin 2.3 (L) 3.5 - 5.0 g/dL   AST 179 (H) 15 - 41 U/L   ALT 188 (H) 0 - 44 U/L   Alkaline Phosphatase 146 (H) 38 - 126 U/L   Total Bilirubin 0.4 0.3 - 1.2 mg/dL   GFR calc non Af Amer >60 >60 mL/min   GFR calc Af Amer >60 >60 mL/min   Anion gap 10 5 - 15  Type and screen     Status: None   Collection Time: 03/25/18  2:29 PM  Result Value Ref Range   ABO/RH(D) A POS    Antibody Screen NEG    Sample Expiration      03/28/2018 Performed at [facility], [address],  [place], [place] [address]   ABO/Rh     Status: None   Collection Time: 03/25/18  2:29 PM  Result Value Ref Range   ABO/RH(D)      A POS Performed at [facility], [address] [place], [place] [address]   Glucose, capillary     Status: None   Collection Time: 03/25/18  2:36 PM  Result Value Ref Range   Glucose-Capillary 79 70 - 99 mg/dL  Glucose, capillary     Status: Abnormal   Collection Time: 03/25/18  6:26 PM  Result Value Ref Range   Glucose-Capillary 67 (L) 70 - 99 mg/dL  Glucose, capillary     Status: Abnormal   Collection Time: 03/25/18  6:50 PM  Result Value Ref Range   Glucose-Capillary 67 (L) 70 - 99 mg/dL  Glucose,  capillary     Status: None   Collection Time: 03/25/18  7:07 PM  Result Value Ref Range   Glucose-Capillary 80 70 - 99 mg/dL    Assessment / Plan: [redacted]w[redacted]d week IUP here for IOL due to A2GDM Labor: progressing on Pitocin Fetal Wellbeing:  Category 1 Pain Control:  Epidural in place Anticipated MOD:  SVD  Arlyce Harman, DO  [facility], PGY-2 03/25/2018 8:48 PM

## 2018-03-26 ENCOUNTER — Encounter (HOSPITAL_COMMUNITY): Payer: Self-pay

## 2018-03-26 LAB — RPR: RPR Ser Ql: NONREACTIVE

## 2018-03-26 MED ORDER — BENZOCAINE-MENTHOL 20-0.5 % EX AERO
1.0000 "application " | INHALATION_SPRAY | CUTANEOUS | Status: DC | PRN
  Administered 2018-03-26: 1 via TOPICAL
  Filled 2018-03-26: qty 56

## 2018-03-26 MED ORDER — SIMETHICONE 80 MG PO CHEW: 80.0000 mg | CHEWABLE_TABLET | ORAL | Status: DC | PRN

## 2018-03-26 MED ORDER — ONDANSETRON HCL 4 MG PO TABS: 4.0000 mg | ORAL_TABLET | ORAL | Status: DC | PRN

## 2018-03-26 MED ORDER — ZOLPIDEM TARTRATE 5 MG PO TABS: 5.0000 mg | ORAL_TABLET | Freq: Every evening | ORAL | Status: DC | PRN

## 2018-03-26 MED ORDER — SENNOSIDES-DOCUSATE SODIUM 8.6-50 MG PO TABS
2.0000 | ORAL_TABLET | ORAL | Status: DC
  Administered 2018-03-26: 2 via ORAL
  Filled 2018-03-26: qty 2

## 2018-03-26 MED ORDER — DIBUCAINE 1 % RE OINT: 1.0000 "application " | TOPICAL_OINTMENT | RECTAL | Status: DC | PRN

## 2018-03-26 MED ORDER — IBUPROFEN 600 MG PO TABS
600.0000 mg | ORAL_TABLET | Freq: Four times a day (QID) | ORAL | Status: DC
  Administered 2018-03-26 – 2018-03-27 (×6): 600 mg via ORAL
  Filled 2018-03-26 (×6): qty 1

## 2018-03-26 MED ORDER — DIPHENHYDRAMINE HCL 25 MG PO CAPS: 25.0000 mg | ORAL_CAPSULE | Freq: Four times a day (QID) | ORAL | Status: DC | PRN

## 2018-03-26 MED ORDER — ONDANSETRON HCL 4 MG/2ML IJ SOLN: 4.0000 mg | INTRAMUSCULAR | Status: DC | PRN

## 2018-03-26 MED ORDER — OXYCODONE-ACETAMINOPHEN 5-325 MG PO TABS
1.0000 | ORAL_TABLET | Freq: Once | ORAL | Status: AC
  Administered 2018-03-26: 1 via ORAL
  Filled 2018-03-26: qty 1

## 2018-03-26 MED ORDER — WITCH HAZEL-GLYCERIN EX PADS: 1.0000 "application " | MEDICATED_PAD | CUTANEOUS | Status: DC | PRN

## 2018-03-26 MED ORDER — PRENATAL MULTIVITAMIN CH
1.0000 | ORAL_TABLET | Freq: Every day | ORAL | Status: DC
  Administered 2018-03-26 – 2018-03-27 (×2): 1 via ORAL
  Filled 2018-03-26 (×2): qty 1

## 2018-03-26 MED ORDER — COCONUT OIL OIL: 1.0000 "application " | TOPICAL_OIL | Status: DC | PRN

## 2018-03-26 MED ORDER — TETANUS-DIPHTH-ACELL PERTUSSIS 5-2.5-18.5 LF-MCG/0.5 IM SUSP: 0.5000 mL | Freq: Once | INTRAMUSCULAR | Status: DC

## 2018-03-26 MED ORDER — ACETAMINOPHEN 325 MG PO TABS
650.0000 mg | ORAL_TABLET | ORAL | Status: DC | PRN
  Administered 2018-03-26 (×2): 650 mg via ORAL
  Filled 2018-03-26 (×2): qty 2

## 2018-03-26 NOTE — Discharge Summary (Signed)
Postpartum Discharge Summary     Patient Name: Mary Hull DOB: 1995-08-31 MRN: [id number]  Date of admission: 03/25/2018 Delivering Provider: Aviva Signs   Date of discharge: 03/27/2018  Admitting diagnosis: preadmission Intrauterine pregnancy: [redacted]w[redacted]d     Secondary diagnosis:  Active Problems:   History of gestational diabetes   Gestational diabetes mellitus, class A2   Kidney abnormality of fetus on prenatal ultrasound   Elevated liver enzymes   GDM, class A2  Additional problems: elevated LFTs, fetal renal pelvis dilation, hx PP depression     Discharge diagnosis: Term Pregnancy Delivered and GDM A2                                                                                                Post partum procedures:none  Augmentation: Pitocin  Complications: None  Hospital course:  Induction of Labor With Vaginal Delivery   23 y.o. yo [id number] at [redacted]w[redacted]d was admitted to the hospital 03/25/2018 for induction of labor.  Indication for induction: A2 DM.  Patient had an uncomplicated labor course as follows: Membrane Rupture Time/Date: 9:56 PM ,03/25/2018   Progressed quickly in labor, uncomplicated birth Intrapartum Procedures: Episiotomy: None [1]                                         Lacerations:  Periurethral [8]  Patient had delivery of a Viable infant.  Information for the patient's newborn:  Jiyah, Teachman [[id number]]  Delivery Method: Vag-Spont   03/26/2018  Details of delivery can be found in separate delivery note.  Patient had a routine postpartum course. Patient is discharged home 03/27/18.  Magnesium Sulfate recieved: No BMZ received: No  Physical exam  Vitals:   03/26/18 1938 03/26/18 2307 03/27/18 0543 03/27/18 0747  BP: 132/76 116/70 111/63 113/71  Pulse: 93 66 82 86  Resp: 19 18 17 18   Temp: 97.7 F (36.5 C) 97.6 F (36.4 C) (!) 97.5 F (36.4 C) 97.8 F (36.6 C)  TempSrc: Oral Oral Oral Oral  SpO2: 100% 100% 100% 100%   Weight:      Height:       General: alert, cooperative and no distress Lochia: appropriate Uterine Fundus: firm Incision: N/A DVT Evaluation: No evidence of DVT seen on physical exam. Labs: Lab Results  Component Value Date   WBC 7.8 03/25/2018   HGB 11.2 (L) 03/25/2018   HCT 35.1 (L) 03/25/2018   MCV 84.6 03/25/2018   PLT 257 03/25/2018   CMP Latest Ref Rng & Units 03/27/2018  Glucose 70 - 99 mg/dL 122(Q)  BUN 6 - 20 mg/dL 7  Creatinine 8.25 - 0.03 mg/dL 7.04  Sodium 888 - 916 mmol/L 137  Potassium 3.5 - 5.1 mmol/L 3.9  Chloride 98 - 111 mmol/L 107  CO2 22 - 32 mmol/L 22  Calcium 8.9 - 10.3 mg/dL 8.9  Total Protein 6.5 - 8.1 g/dL 9.4(H)  Total Bilirubin 0.3 - 1.2 mg/dL 0.4  Alkaline Phos 38 - 126 U/L 113  AST  15 - 41 U/L 135(H)  ALT 0 - 44 U/L 232(H)    Discharge instruction: per After Visit Summary and "Baby and Me Booklet".  After visit meds:  Allergies as of 03/27/2018      Reactions   Atuss Da [pse-bromphen-chlophedianol] Hives      Medication List    STOP taking these medications   albuterol 108 (90 Base) MCG/ACT inhaler Commonly known as:  PROVENTIL HFA;VENTOLIN HFA   glyBURIDE 5 MG tablet Commonly known as:  DIABETA     TAKE these medications   ibuprofen 600 MG tablet Commonly known as:  ADVIL,MOTRIN Take 1 tablet (600 mg total) by mouth every 6 (six) hours.   PRENATAL VITAMIN PO Take by mouth daily.       Diet: routine diet  Activity: Advance as tolerated. Pelvic rest for 6 weeks.   Outpatient follow up:4 weeks Follow up Appt: Future Appointments  Date Time Provider Department Center  05/01/2018 10:00 AM Cheral Marker, CNM [facility]   Follow up Visit:  [facility] Please schedule this patient for Postpartum visit in: 4 weeks with the following provider: Any provider For C/S patients schedule nurse incision check in weeks 2 weeks: no High risk pregnancy complicated by: GDM Delivery mode:  SVD Anticipated Birth Control:   Depo PP Procedures needed: 2 hour GTT  Schedule Integrated BH visit: no      Newborn Data: Live born female  Birth Weight: 6 lb 12.6 oz (3079 g) APGAR: 9, 9  Newborn Delivery   Birth date/time:  03/26/2018 00:20:00 Delivery type:  Vaginal, Spontaneous     Baby Feeding: Bottle Disposition:home with mother   03/27/2018 Scheryl Darter, MD

## 2018-03-26 NOTE — Anesthesia Postprocedure Evaluation (Signed)
Anesthesia Post Note  Patient: Mary Hull  Procedure(s) Performed: AN AD HOC LABOR EPIDURAL     Patient location during evaluation: OB High Risk Anesthesia Type: Epidural Level of consciousness: awake Pain management: pain level controlled Vital Signs Assessment: post-procedure vital signs reviewed and stable Respiratory status: spontaneous breathing Cardiovascular status: stable Postop Assessment: patient able to bend at knees, epidural receding and able to ambulate Anesthetic complications: no    Last Vitals:  Vitals:   03/26/18 0327 03/26/18 0634  BP: 136/76 114/70  Pulse: (!) 112 (!) 105  Resp: 18 16  Temp: 36.8 C 36.8 C  SpO2: 98% 98%    Last Pain:  Vitals:   03/26/18 0634  TempSrc: Oral  PainSc: 0-No pain   Pain Goal: Patients Stated Pain Goal: 3 (03/26/18 0350)                 Edison Pace

## 2018-03-27 LAB — COMPREHENSIVE METABOLIC PANEL
ALT: 232 U/L — ABNORMAL HIGH (ref 0–44)
AST: 135 U/L — ABNORMAL HIGH (ref 15–41)
Albumin: 1.9 g/dL — ABNORMAL LOW (ref 3.5–5.0)
Alkaline Phosphatase: 113 U/L (ref 38–126)
Anion gap: 8 (ref 5–15)
BUN: 7 mg/dL (ref 6–20)
CO2: 22 mmol/L (ref 22–32)
Calcium: 8.9 mg/dL (ref 8.9–10.3)
Chloride: 107 mmol/L (ref 98–111)
Creatinine, Ser: 0.61 mg/dL (ref 0.44–1.00)
GFR calc non Af Amer: >60 mL/min (ref >60)
Glucose, Bld: 100 mg/dL — ABNORMAL HIGH (ref 70–99)
Potassium: 3.9 mmol/L (ref 3.5–5.1)
Sodium: 137 mmol/L (ref 135–145)
Total Bilirubin: 0.4 mg/dL (ref 0.3–1.2)
Total Protein: 5.7 g/dL — ABNORMAL LOW (ref 6.5–8.1)

## 2018-03-27 MED ORDER — IBUPROFEN 600 MG PO TABS: 600.0000 mg | ORAL_TABLET | Freq: Four times a day (QID) | ORAL | 0 refills | Status: DC

## 2018-03-27 NOTE — Progress Notes (Signed)
Discharge instructions and prescriptions given to pt.  Discussed post-vaginal delivery care, signs and symptoms to report to the MD, upcoming appointments, and medications. Pt verbalizes understanding and has no questions or concerns at this time. Pt discharged from hospital with baby in stable condition.

## 2018-03-27 NOTE — Progress Notes (Signed)
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 CSW educated MOB about PPD. CSW informed MOB of possible supports and interventions to decrease PPD.  CSW also encouraged MOB to seek medical attention if needed for increased signs and symptoms for PPD. MOB expressed that when she first experienced PPD she was very angry about things, depressed at time, but never to point of not wanting to get out of bed. MOB reports that she never received any counselingduring that period, however she was placed on Lexapro for 6 months. MOB reports that this medication was short term and that it has now been discontinued.   MOB report that she has support from FOB Freida Busman), mother, and mother in Social worker. Pt report that she has 38 year old son that is currently staying with other family members at this time. Aside from previous PPD MOB denied having any further mental health difficulties. MOB reporst that she has everything needed to care for pt once home. CSW further discussed SIDS with MOB and PPD as well as provided her with resources in the vent that symptoms of PPD flare up after this pregnancy.    MOB reports no further concerns at this time. CSW identifies no further barriers to discharge once pt has been medically cleared. CSW will sign off.    Mary Hull. Mary Hull, MSW, LCSW-[facility] [phone]

## 2018-03-27 NOTE — Discharge Instructions (Signed)
Vaginal Delivery, Care After °Refer to this sheet in the next few weeks. These instructions provide you with information about caring for yourself after vaginal delivery. Your health care provider may also give you more specific instructions. Your treatment has been planned according to current medical practices, but problems sometimes occur. Call your health care provider if you have any problems or questions. °What can I expect after the procedure? °After vaginal delivery, it is common to have: °· Some bleeding from your vagina. °· Soreness in your abdomen, your vagina, and the area of skin between your vaginal opening and your anus (perineum). °· Pelvic cramps. °· Fatigue. °Follow these instructions at home: °Medicines °· Take over-the-counter and prescription medicines only as told by your health care provider. °· If you were prescribed an antibiotic medicine, take it as told by your health care provider. Do not stop taking the antibiotic until it is finished. °Driving ° °· Do not drive or operate heavy machinery while taking prescription pain medicine. °· Do not drive for 24 hours if you received a sedative. °Lifestyle °· Do not drink alcohol. This is especially important if you are breastfeeding or taking medicine to relieve pain. °· Do not use tobacco products, including cigarettes, chewing tobacco, or e-cigarettes. If you need help quitting, ask your health care provider. °Eating and drinking °· Drink at least 8 eight-ounce glasses of water every day unless you are told not to by your health care provider. If you choose to breastfeed your baby, you may need to drink more water than this. °· Eat high-fiber foods every day. These foods may help prevent or relieve constipation. High-fiber foods include: °? Whole grain cereals and breads. °? Brown rice. °? Beans. °? Fresh fruits and vegetables. °Activity °· Return to your normal activities as told by your health care provider. Ask your health care provider what  activities are safe for you. °· Rest as much as possible. Try to rest or take a nap when your baby is sleeping. °· Do not lift anything that is heavier than your baby or 10 lb (4.5 kg) until your health care provider says that it is safe. °· Talk with your health care provider about when you can engage in sexual activity. This may depend on your: °? Risk of infection. °? Rate of healing. °? Comfort and desire to engage in sexual activity. °Vaginal Care °· If you have an episiotomy or a vaginal tear, check the area every day for signs of infection. Check for: °? More redness, swelling, or pain. °? More fluid or blood. °? Warmth. °? Pus or a bad smell. °· Do not use tampons or douches until your health care provider says this is safe. °· Watch for any blood clots that may pass from your vagina. These may look like clumps of dark red, brown, or black discharge. °General instructions °· Keep your perineum clean and dry as told by your health care provider. °· Wear loose, comfortable clothing. °· Wipe from front to back when you use the toilet. °· Ask your health care provider if you can shower or take a bath. If you had an episiotomy or a perineal tear during labor and delivery, your health care provider may tell you not to take baths for a certain length of time. °· Wear a bra that supports your breasts and fits you well. °· If possible, have someone help you with household activities and help care for your baby for at least a few days after you   leave the hospital. °· Keep all follow-up visits for you and your baby as told by your health care provider. This is important. °Contact a health care provider if: °· You have: °? Vaginal discharge that has a bad smell. °? Difficulty urinating. °? Pain when urinating. °? A sudden increase or decrease in the frequency of your bowel movements. °? More redness, swelling, or pain around your episiotomy or vaginal tear. °? More fluid or blood coming from your episiotomy or vaginal  tear. °? Pus or a bad smell coming from your episiotomy or vaginal tear. °? A fever. °? A rash. °? Little or no interest in activities you used to enjoy. °? Questions about caring for yourself or your baby. °· Your episiotomy or vaginal tear feels warm to the touch. °· Your episiotomy or vaginal tear is separating or does not appear to be healing. °· Your breasts are painful, hard, or turn red. °· You feel unusually sad or worried. °· You feel nauseous or you vomit. °· You pass large blood clots from your vagina. If you pass a blood clot from your vagina, save it to show to your health care provider. Do not flush blood clots down the toilet without having your health care provider look at them. °· You urinate more than usual. °· You are dizzy or light-headed. °· You have not breastfed at all and you have not had a menstrual period for 12 weeks after delivery. °· You have stopped breastfeeding and you have not had a menstrual period for 12 weeks after you stopped breastfeeding. °Get help right away if: °· You have: °? Pain that does not go away or does not get better with medicine. °? Chest pain. °? Difficulty breathing. °? Blurred vision or spots in your vision. °? Thoughts about hurting yourself or your baby. °· You develop pain in your abdomen or in one of your legs. °· You develop a severe headache. °· You faint. °· You bleed from your vagina so much that you fill two sanitary pads in one hour. °This information is not intended to replace advice given to you by your health care provider. Make sure you discuss any questions you have with your health care provider. °Document Released: 01/12/2000 Document Revised: 06/28/2015 Document Reviewed: 01/29/2015 °Elsevier Interactive Patient Education © 2019 [employer] ° °

## 2018-03-29 IMAGING — DX DG CHEST 2V
3 series · 3 of 3 positions shown · non-contrast
Comparison: November 06, 2015

CLINICAL DATA: Cough and congestion.  Fever.

EXAM:
CHEST  2 VIEW

[chest lat (1 of 2)]
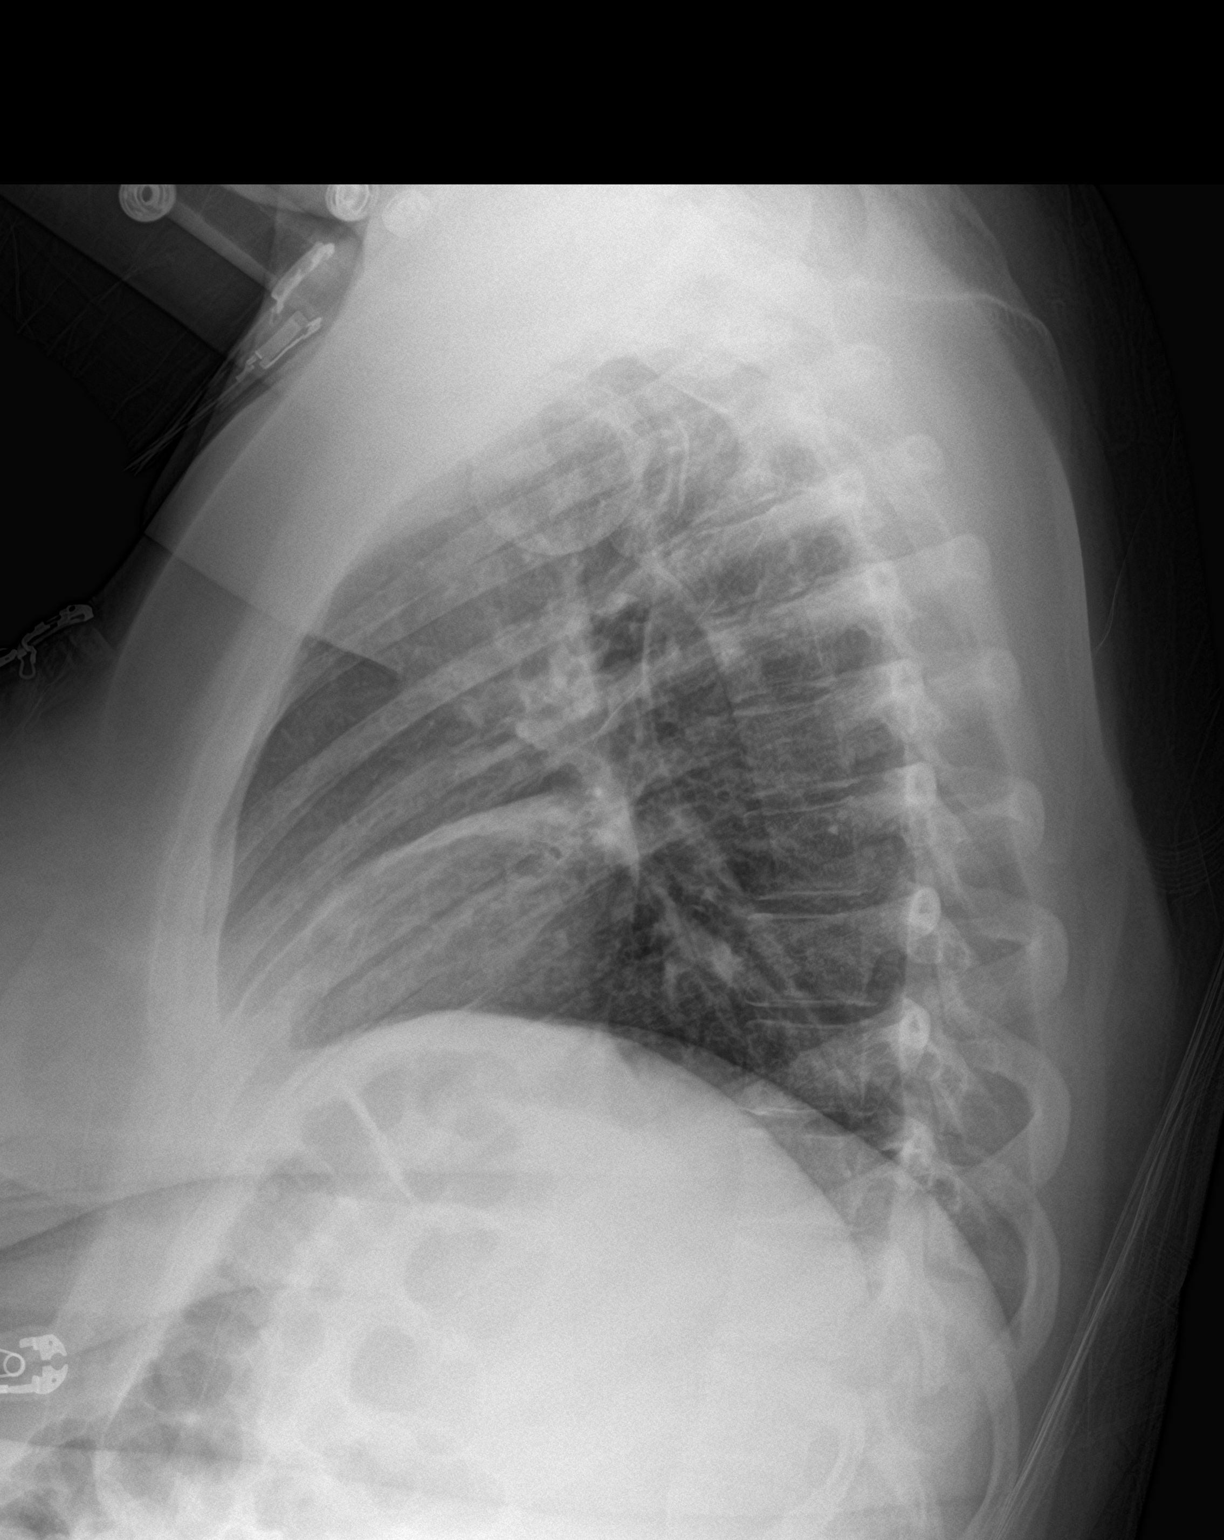

[chest ap]
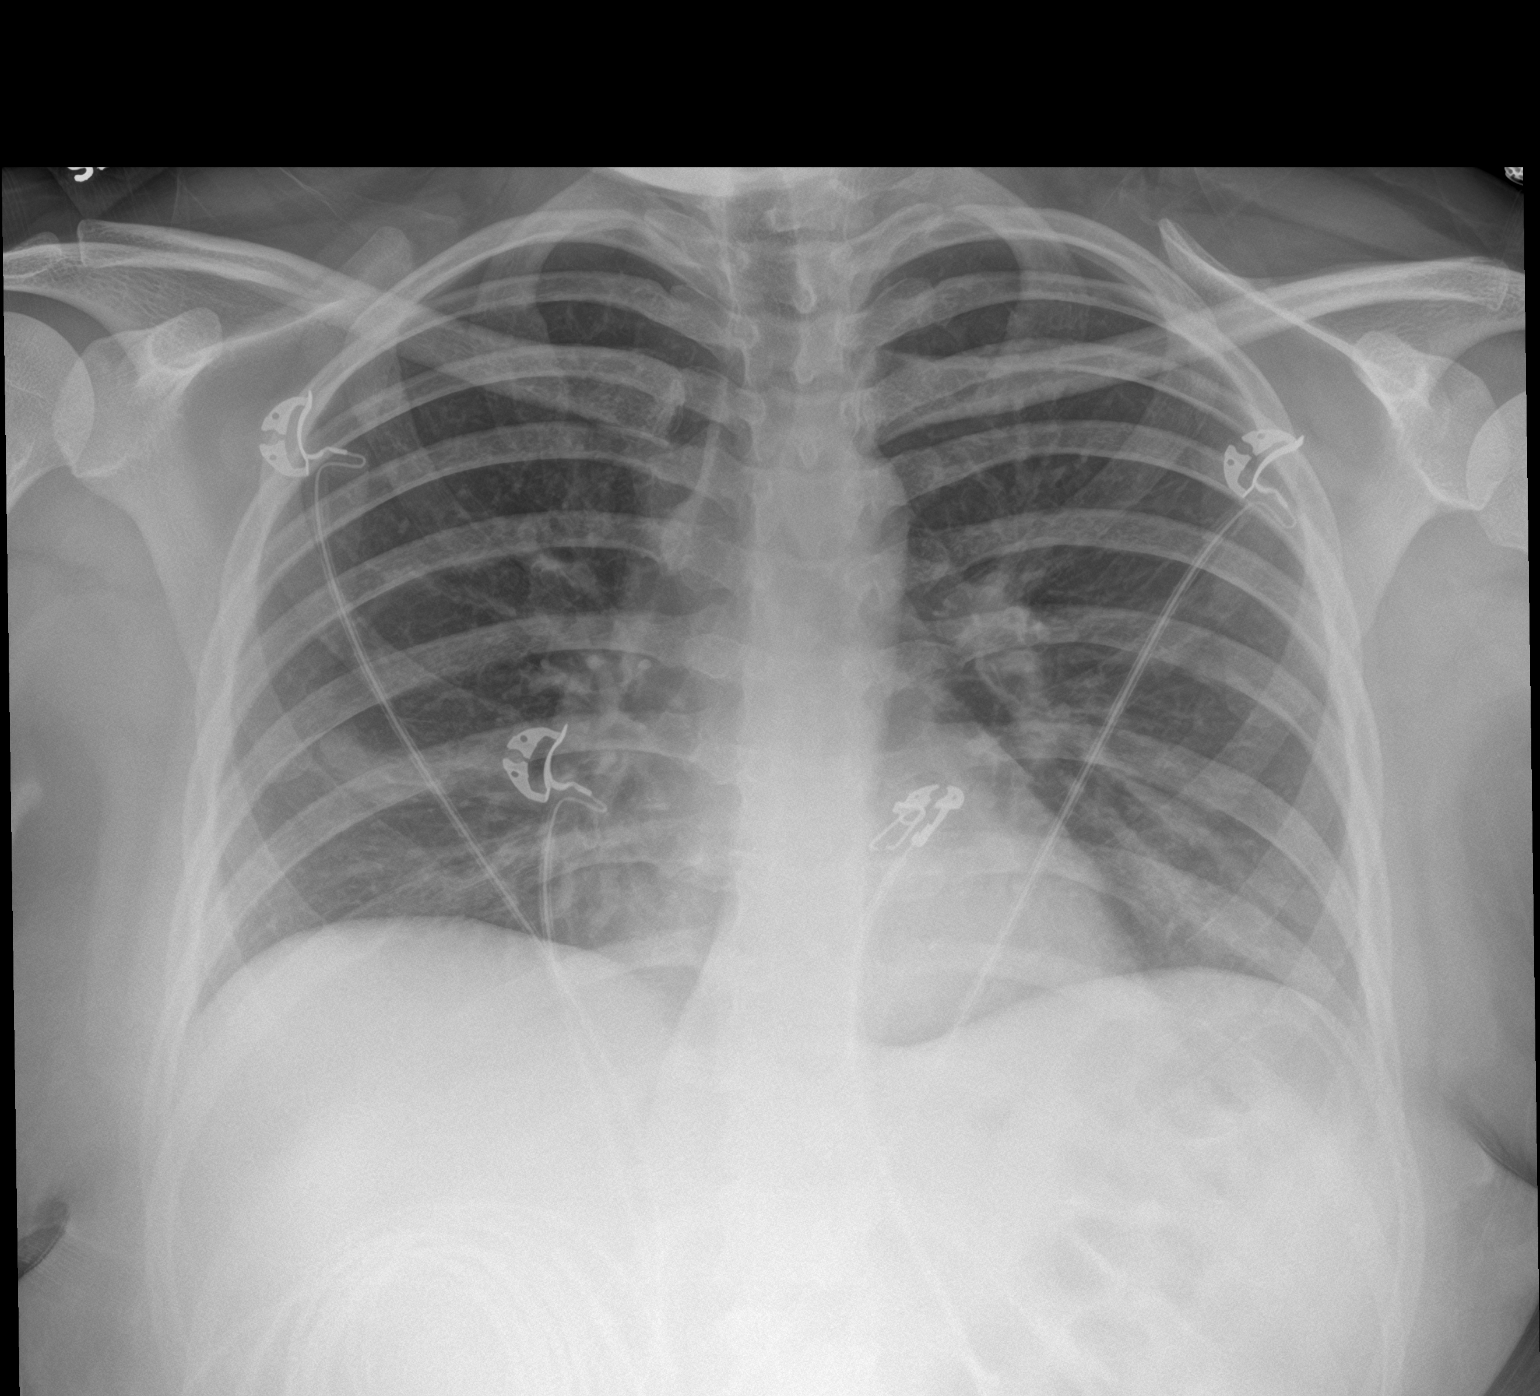

[chest lat (2 of 2)]
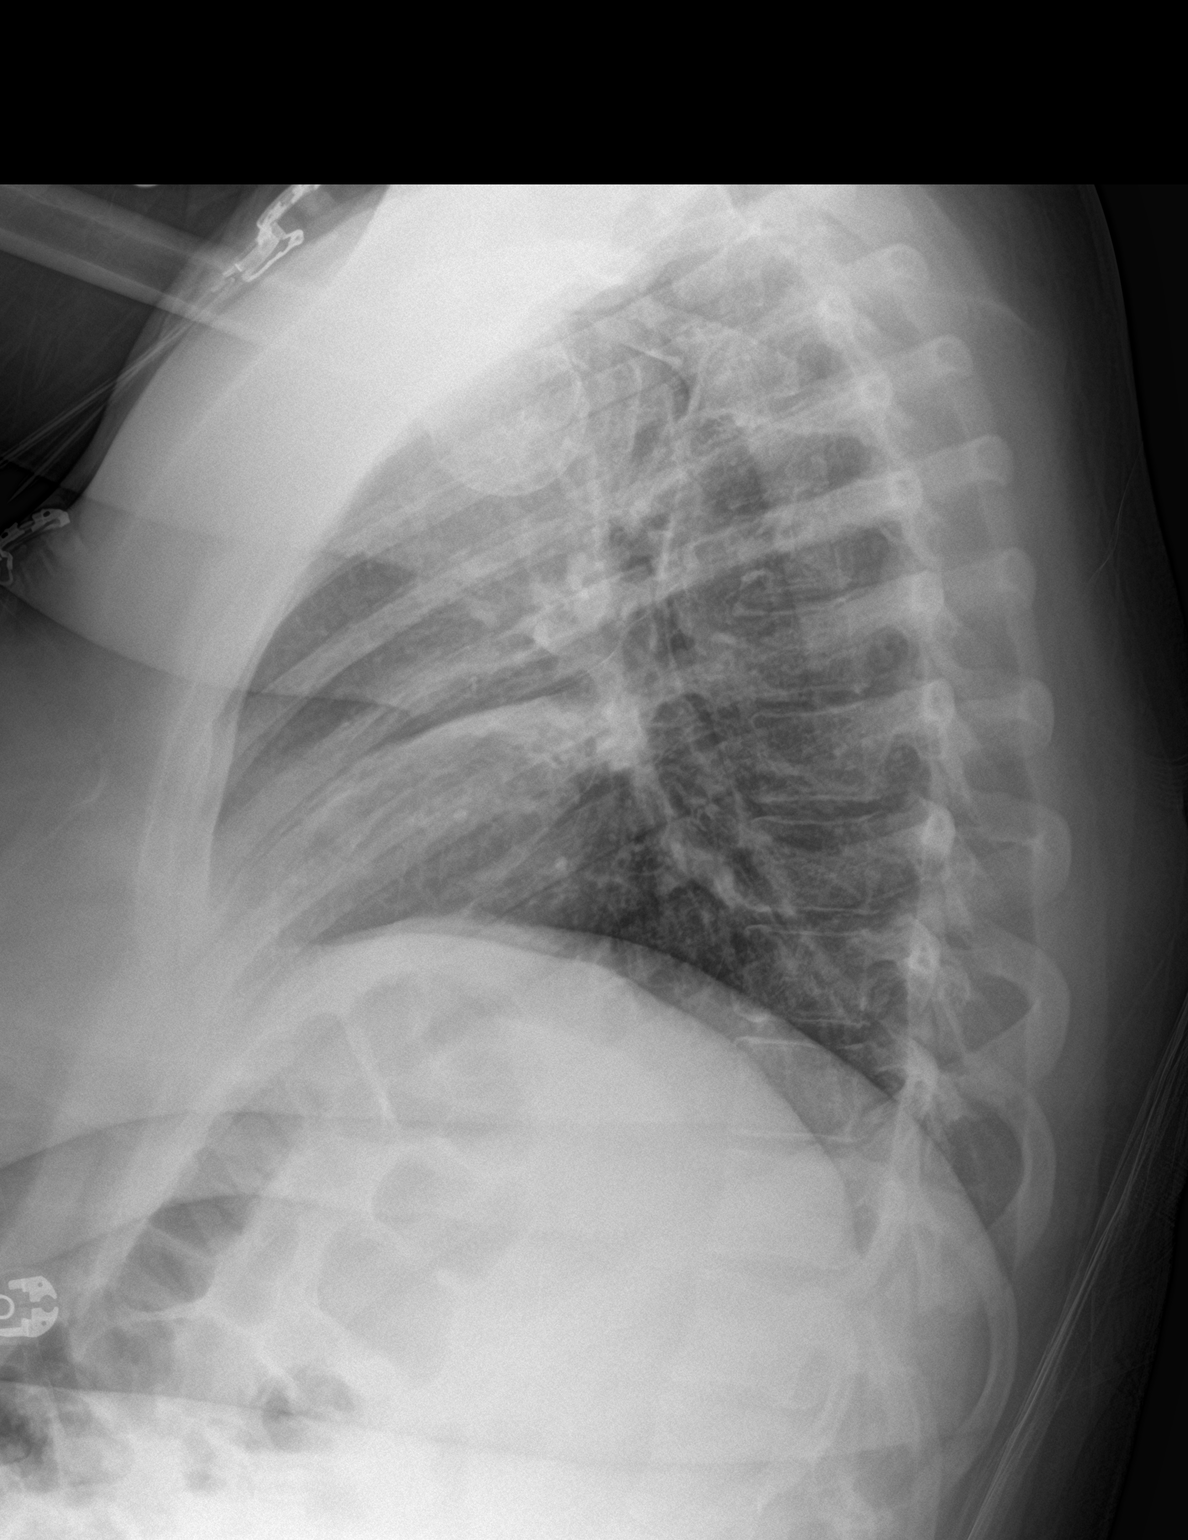

[3 of 3 positions shown; findings below may reference images not displayed]

FINDINGS: There is patchy infiltrate in the right middle lobe, better
appreciated on the lateral view. Lungs elsewhere are clear. Heart
size and pulmonary vascularity are normal. No adenopathy. No bone
lesions.
IMPRESSION: Patchy infiltrate right middle lobe.  Lungs elsewhere clear.

## 2018-04-30 ENCOUNTER — Encounter: Payer: Self-pay | Admitting: *Deleted

## 2018-05-01 ENCOUNTER — Encounter: Payer: Self-pay | Admitting: Women's Health

## 2018-05-01 ENCOUNTER — Ambulatory Visit (INDEPENDENT_AMBULATORY_CARE_PROVIDER_SITE_OTHER): Payer: Medicaid Other | Admitting: Women's Health

## 2018-05-01 ENCOUNTER — Other Ambulatory Visit: Payer: Self-pay

## 2018-05-01 DIAGNOSIS — R945 Abnormal results of liver function studies: Secondary | ICD-10-CM

## 2018-05-01 DIAGNOSIS — Z1389 Encounter for screening for other disorder: Secondary | ICD-10-CM

## 2018-05-01 DIAGNOSIS — R7989 Other specified abnormal findings of blood chemistry: Secondary | ICD-10-CM

## 2018-05-01 DIAGNOSIS — Z8632 Personal history of gestational diabetes: Secondary | ICD-10-CM

## 2018-05-01 NOTE — Progress Notes (Signed)
TELEHEALTH VIRTUAL POSTPARTUM VISIT ENCOUNTER NOTE  I connected with@ on 05/01/18 at 11:45 AM EDT by webex at home and verified that I am speaking with the correct person using two identifiers.   I discussed the limitations, risks, security and privacy concerns of performing an evaluation and management service by telephone and the availability of in person appointments. I also discussed with the patient that there may be a patient responsible charge related to this service. The patient expressed understanding and agreed to proceed.  Appointment Date: 05/01/2018  OBGYN Clinic: [facility]  Chief Complaint:  Chief Complaint  Patient presents with  . Postpartum Care    History of Present Illness: Mary Hull is a 23 y.o. Caucasian G2P2002 (No LMP recorded.), seen for the above chief complaint. Her past medical history is significant for A2DM and elevated LFTs during pregnancy.   She is s/p normal spontaneous vaginal delivery on 03/26/18 at 39.1 weeks after IOL for A2DM; she was discharged to home on PPD#1. Pregnancy complicated by A2DM, elevated LFTs. Baby is doing well.  Complains of nothing. No ruq/epigastric pain, n/v.   Vaginal bleeding or discharge: No  Mode of feeding infant: Bottle Intercourse: Yes , few weeks ago, but now separated from husband, no currently abstinent Contraception: abstinence PP depression s/s: No .  Any bowel or bladder issues: No  Pap smear: turned 22yo in Dec, hasn't had pap yet   Review of Systems: Positive for nothing. Her 12 point review of systems is negative or as noted in the History of Present Illness.  Patient Active Problem List   Diagnosis Date Noted  . Elevated liver enzymes 03/18/2018  . Gallbladder sludge   . History of gestational diabetes 06/20/2016  . Abnormal findings on diagnostic imaging of gall bladder 03/27/2016    Medications [name] had no medications administered during this visit. Current Outpatient  Medications  Medication Sig Dispense Refill  . Prenatal Vit-Fe Fumarate-FA (PRENATAL VITAMIN PO) Take by mouth daily.    Marland Kitchen ibuprofen (ADVIL,MOTRIN) 600 MG tablet Take 1 tablet (600 mg total) by mouth every 6 (six) hours. (Patient not taking: Reported on 05/01/2018) 30 tablet 0   No current facility-administered medications for this visit.     Allergies Atuss da [pse-bromphen-chlophedianol]  Physical Exam:  General:  Alert, oriented and cooperative.   Mental Status: Normal mood and affect perceived. Normal judgment and thought content.  Rest of physical exam deferred due to type of encounter  PP Depression Screening:   Edinburgh Postnatal Depression Scale - 05/01/18 1248      Edinburgh Postnatal Depression Scale:  In the Past 7 Days   I have been able to laugh and see the funny side of things.  0    I have looked forward with enjoyment to things.  0    I have blamed myself unnecessarily when things went wrong.  0    I have been anxious or worried for no good reason.  1    I have felt scared or panicky for no good reason.  0    Things have been getting on top of me.  1    I have been so unhappy that I have had difficulty sleeping.  0    I have felt sad or miserable.  0    I have been so unhappy that I have been crying.  1    The thought of harming myself has occurred to me.  0    Inocente Salles Postnatal  Depression Scale Total  3       Assessment:Patient is a 23 y.o. [id number] who is 5 weeks postpartum from a normal spontaneous vaginal delivery.  She is doing well.   Plan:  Pap & physical in w/ A1C d/t A2DM, will also recheck LFTs at that time  RTC  I discussed the assessment and treatment plan with the patient. The patient was provided an opportunity to ask questions and all were answered. The patient agreed with the plan and demonstrated an understanding of the instructions.   The patient was advised to call back or seek an in-person evaluation/go to the ED for any  concerning postpartum symptoms.  I provided 10 minutes of non-face-to-face time during this encounter.   Cheral Marker, CNM [facility] [employer], [facility]

## 2018-06-05 ENCOUNTER — Telehealth: Payer: Self-pay | Admitting: Obstetrics & Gynecology

## 2018-06-05 MED ORDER — ESCITALOPRAM OXALATE 20 MG PO TABS: 20.0000 mg | ORAL_TABLET | Freq: Every day | ORAL | 1 refills | Status: DC

## 2018-06-05 NOTE — Telephone Encounter (Signed)
Meds ordered this encounter  Medications  . escitalopram (LEXAPRO) 20 MG tablet    Sig: Take 1 tablet (20 mg total) by mouth daily.    Dispense:  30 tablet    Refill:  1

## 2018-08-03 ENCOUNTER — Other Ambulatory Visit: Payer: Medicaid Other | Admitting: Women's Health

## 2019-02-02 ENCOUNTER — Ambulatory Visit: Payer: Medicaid Other | Attending: Internal Medicine

## 2019-02-02 ENCOUNTER — Other Ambulatory Visit: Payer: Self-pay

## 2019-02-02 ENCOUNTER — Encounter: Payer: Self-pay | Admitting: Internal Medicine

## 2019-02-02 DIAGNOSIS — Z20822 Contact with and (suspected) exposure to covid-19: Secondary | ICD-10-CM

## 2019-02-04 LAB — NOVEL CORONAVIRUS, NAA: SARS-CoV-2, NAA: NOT DETECTED

## 2019-02-16 NOTE — Progress Notes (Deleted)
Referring Provider: [facility] Primary Care Physician:  Patient, No Pcp Per Primary Gastroenterologist:  Dr. Gala Romney  No chief complaint on file.   HPI:   Mary Hull is a 24 y.o. female presenting today at the request of [facility] ED for abdominal pain.    RUQ [place] in February 2018 with Prominent sludge noted gallbladder. Associated small stones cannot be excluded. Tiny gallbladder polyps cannot be excluded. No evidence of cholecystitis. No biliary distention.  LFTs elevated in February 2020. Last check in our system on 03/27/18 with AST 135, ALT 232, alk phos and total bilirubin normal. Of note, patient was pregnant at this time. Acute hepatitis panel negative. INR normal.     Past Medical History:  Diagnosis Date  . Asthma   . Gallbladder sludge   . Gestational diabetes    diet controlled  . Postpartum depression     Past Surgical History:  Procedure Laterality Date  . ADENOIDECTOMY    . FRACTURE SURGERY     L elbow  . TONSILLECTOMY      Current Outpatient Medications  Medication Sig Dispense Refill  . escitalopram (LEXAPRO) 20 MG tablet Take 1 tablet (20 mg total) by mouth daily. 30 tablet 1  . ibuprofen (ADVIL,MOTRIN) 600 MG tablet Take 1 tablet (600 mg total) by mouth every 6 (six) hours. (Patient not taking: Reported on 05/01/2018) 30 tablet 0  . Prenatal Vit-Fe Fumarate-FA (PRENATAL VITAMIN PO) Take by mouth daily.     No current facility-administered medications for this visit.    Allergies as of 02/17/2019 - Review Complete 05/01/2018  Allergen Reaction Noted  . Atuss da [pse-bromphen-chlophedianol] Hives 03/26/2018    Family History  Problem Relation Age of Onset  . Hypertension Maternal Grandmother   . Hypertension Maternal Aunt     Social History   Socioeconomic History  . Marital status: Married    Spouse name: Not on file  . Number of children: 1  . Years of education: Not on file  . Highest education level: Not on file    Occupational History  . Not on file  Tobacco Use  . Smoking status: Current Some Day Smoker    Years: 1.00    Types: Cigarettes  . Smokeless tobacco: Never Used  Substance and Sexual Activity  . Alcohol use: No  . Drug use: No  . Sexual activity: Yes    Birth control/protection: None  Other Topics Concern  . Not on file  Social History Narrative  . Not on file   Social Determinants of Health   Financial Resource Strain: Low Risk   . Difficulty of Paying Living Expenses: Not hard at all  Food Insecurity: No Food Insecurity  . Worried About Charity fundraiser in the Last Year: Never true  . Ran Out of Food in the Last Year: Never true  Transportation Needs: Unknown  . Lack of Transportation (Medical): No  . Lack of Transportation (Non-Medical): Not on file  Physical Activity:   . Days of Exercise per Week: Not on file  . Minutes of Exercise per Session: Not on file  Stress: Stress Concern Present  . Feeling of Stress : To some extent  Social Connections:   . Frequency of Communication with Friends and Family: Not on file  . Frequency of Social Gatherings with Friends and Family: Not on file  . Attends Religious Services: Not on file  . Active Member of Clubs or Organizations: Not on file  . Attends  Club or Organization Meetings: Not on file  . Marital Status: Not on file  Intimate Partner Violence: Not At Risk  . Fear of Current or Ex-Partner: No  . Emotionally Abused: No  . Physically Abused: No  . Sexually Abused: No    Review of Systems: Gen: Denies any fever, chills, fatigue, weight loss, lack of appetite.  CV: Denies chest pain, heart palpitations, peripheral edema, syncope.  Resp: Denies shortness of breath at rest or with exertion. Denies wheezing or cough.  GI: Denies dysphagia or odynophagia. Denies jaundice, hematemesis, fecal incontinence. GU : Denies urinary burning, urinary frequency, urinary hesitancy MS: Denies joint pain, muscle weakness, cramps,  or limitation of movement.  Derm: Denies rash, itching, dry skin Psych: Denies depression, anxiety, memory loss, and confusion Heme: Denies bruising, bleeding, and enlarged lymph nodes.  Physical Exam: There were no vitals taken for this visit. General:   Alert and oriented. Pleasant and cooperative. Well-nourished and well-developed.  Head:  Normocephalic and atraumatic. Eyes:  Without icterus, sclera clear and conjunctiva pink.  Ears:  Normal auditory acuity. Nose:  No deformity, discharge,  or lesions. Mouth:  No deformity or lesions, oral mucosa pink.  Neck:  Supple, without mass or thyromegaly. Lungs:  Clear to auscultation bilaterally. No wheezes, rales, or rhonchi. No distress.  Heart:  S1, S2 present without murmurs appreciated.  Abdomen:  +BS, soft, non-tender and non-distended. No HSM noted. No guarding or rebound. No masses appreciated.  Rectal:  Deferred  Msk:  Symmetrical without gross deformities. Normal posture. Pulses:  Normal pulses noted. Extremities:  Without clubbing or edema. Neurologic:  Alert and  oriented x4;  grossly normal neurologically. Skin:  Intact without significant lesions or rashes. Cervical Nodes:  No significant cervical adenopathy. Psych:  Alert and cooperative. Normal mood and affect.

## 2019-02-17 ENCOUNTER — Ambulatory Visit (INDEPENDENT_AMBULATORY_CARE_PROVIDER_SITE_OTHER): Payer: Medicaid Other | Admitting: Gastroenterology

## 2019-02-17 ENCOUNTER — Ambulatory Visit: Payer: Medicaid Other | Admitting: Gastroenterology

## 2019-02-17 ENCOUNTER — Other Ambulatory Visit: Payer: Self-pay

## 2019-02-17 ENCOUNTER — Encounter: Payer: Self-pay | Admitting: Gastroenterology

## 2019-02-17 VITALS — BP 139/91 | HR 108 | Temp 97.3°F | Ht 61.0 in | Wt 169.4 lb

## 2019-02-17 DIAGNOSIS — R110 Nausea: Secondary | ICD-10-CM | POA: Diagnosis not present

## 2019-02-17 DIAGNOSIS — D509 Iron deficiency anemia, unspecified: Secondary | ICD-10-CM | POA: Insufficient documentation

## 2019-02-17 DIAGNOSIS — R1013 Epigastric pain: Secondary | ICD-10-CM | POA: Diagnosis not present

## 2019-02-17 DIAGNOSIS — R109 Unspecified abdominal pain: Secondary | ICD-10-CM | POA: Insufficient documentation

## 2019-02-17 MED ORDER — PANTOPRAZOLE SODIUM 40 MG PO TBEC: 40.0000 mg | DELAYED_RELEASE_TABLET | Freq: Every day | ORAL | 3 refills | Status: AC

## 2019-02-17 NOTE — Patient Instructions (Signed)
Please have labs completed.   I am sending in Protonix 40 mg. You will take this daily 30 minutes before breakfast. Do not start this medication until you have completed the labs and breath test to check for H. Pylori (bacteria in your stomach) and this medication will mess up the test.   Avoid all NSAIDs. This include ibuprofen, Advil, Aleve, and Goody Powders.   If you have persistent Right upper abdominal pain with nausea and vomiting, you should proceed to the emergency room.   Otherwise, I will call you with results and further recommendations.   Ermalinda Memos, PA-C [facility]

## 2019-02-17 NOTE — Assessment & Plan Note (Addendum)
24 year old female with known gallbladder sludge and likely cholelithiasis since 2018 presenting for further evaluation of 1 month of intermittent daily epigastric pain typically worse after all meals regardless of what she eats but also with symptoms between meals at times.  Associated nausea without vomiting.  Recently seen at [facility] on 01/04/19 for acute right flank pain likely secondary to passage of kidney stone.  CT A/P without contrast revealed multiple bilateral renal calculi and cholelithiasis.  Labs with CMP essentially normal.  CBC remarkable for hemoglobin 9.9 with microcytic indices.  She was without epigastric pain at the time of ED visit.  This started 1-2 weeks after.  Denies GERD, dysphagia, bright red blood per rectum, melena unintentional weight loss.  Started taking ibuprofen 600-800 mg 3 times daily for abdominal pain.  Prior to 1 month ago would take ibuprofen rarely.  Admits to being told she was iron deficient growing up but last heard anything about this around 24 years old.  She does report monthly menstrual cycles with 3 days of heavy bleeding.  Abdominal exam with moderate tenderness to palpation in the epigastric area, minimal tenderness to deep palpation in right upper quadrant.  Differentials for abdominal pain include gastritis, duodenitis, esophagitis, PUD, H. pylori.  Biliary etiology is possible, but with microcytic anemia, epigastric pain rather than typical right upper quadrant pain, and symptoms between meals, I feel symptoms are less consistent with biliary etiology.   Update CBC and obtain iron panel.  We will also check for H. pylori as she is not on any acid suppression medications at this time. Start Protonix 40 mg daily 30 minutes before breakfast after completion of H. pylori breath test. Avoid all NSAIDs including ibuprofen, Aleve, Advil, and Goody powders. She was advised if she develops persistent right upper quadrant pain with nausea and vomiting, she  should proceed to the emergency room. Further recommendations following lab results.  If IDA is present, will likely schedule EGD + TCS.

## 2019-02-17 NOTE — Assessment & Plan Note (Signed)
Addressed under abdominal pain.  

## 2019-02-17 NOTE — Assessment & Plan Note (Addendum)
Microcytic anemia noted on recent labs from [facility] dated 01/04/2019.  Hemoglobin 9.9, MCV 73.9, MCH 22.1, MCHC 29.9.  CT abdomen and pelvis without contrast for right posterior flank pain revealed multiple bilateral renal calculi and cholelithiasis.  Current GI symptoms include intermittent daily epigastric pain worsened with all meals regardless of food type but also with pain between meals, associated nausea without vomiting.  No right upper quadrant pain.  Denies GERD, dysphagia, bright red blood per rectum, melena, gross hematuria.  Reports monthly menstrual periods with heavy bleeding for the first 3 days.  Has been taking ibuprofen 600-800 mg 3 times daily x1 month for abdominal pain.  Prior to this, rare ibuprofen use.  Reports being told she had low iron growing up but states she had not heard anything about this since around 24 years old.  Per chart review, hemoglobin was in the 11 range in February 2020 which is at the time of childbirth.   Although she has documented cholelithiasis, her symptoms are not classically biliary.  Additionally with microcytic anemia, I am more concerned for upper GI etiologies including peptic ulcer disease and H. Pylori.  Other differentials include AVMs, celiac disease, less likely malignancy.  Although she does report heavy menstrual cycles, her GI tract will need to be evaluated especially if iron deficiency is confirmed.    We will start with updating CBC, add iron panel, H. pylori breath test, and celiac serologies. Start Protonix 40 mg daily 30 minutes before breakfast after completing H. pylori breath test. Avoid all NSAIDs including ibuprofen, Aleve, Advil, and Goody powders. Pending lab results, if iron deficiency is confirmed, will likely schedule EGD + TCS for evaluation of upper GI symptoms and microcytic anemia.  I am less suspicious for lower GI etiologies of her microcytic anemia; however, this cannot be ruled out.  Further recommendations to  follow.

## 2019-02-17 NOTE — Progress Notes (Signed)
Referring Provider: [facility] Primary Care Physician:  Patient, No Pcp Per Primary Gastroenterologist:  Dr. Jena Gauss  Chief Complaint  Patient presents with  . Abdominal Pain    ruq, states she has gallstones  . Nausea    no vomiting    HPI:   Mary Hull is a 24 y.o. female presenting today at the request of [facility] ED for abdominal pain.    Patient was seen at [facility] on 01/04/2019 for urinary urgency and frequency with acute onset of right posterior flank pain. CT abdomen and pelvis without contrast revealed multiple bilateral renal calculi without hydronephrosis or ureteral stone as well as cholelithiasis. Labs at that time with CMP essentially normal remarkable only for glucose 109.  CBC with WBC 6.1, hemoglobin 9.9 (L), MCV 73.9 (L), MCH 22.1 (L), MCHC 29.9 (L), platelets normal at 360.  Urinalysis with moderate blood, moderate mucus, few bacteria. Patient reported passing a small black speck when urinating in the ED. She was diagnosed with gallstones and ureteral stone. Prescribed tramadol and Zofran.  Today: Had right sided flank pan in December which is why she presented to the ED. She passed a stone while in the emergency department and has not had any right-sided flank pain since denies right upper quadrant pain at the time of ED visit. 1-2 weeks after ED visit, she started having epigastric pain. States the only time she had pain in RUQ was 3 years ago when she was pregnant. Currently pain is epigastric with some extension towards the left side. Present for 1 month. Present daily. Comes and goes. Pain is typically after eating but also sometimes without eating. No nocturnal pain. Tried to limit spicy and greasy foods. No significant improvement. Continues with postprandial pain, doesn't matter what she eats. Pain will last from a few minutes to a couple hours. The longest it has lasted is 4 hours. Associated nausea. No vomiting. No nausea between episodes  of pain. No GERD symptoms. No dysphagia. No significant lower abdominal pain. BMs daily. No constipation or diarrhea. No blood in the stool. No melena. Taking ibuprofen 600-800 mg ibuprofen 3 times a day for abdominal pain without any significant improvement. Prior to 1 month ago, only took ibuprofen about once a month.   States she was told she had low iron growing up but the last she heard about it was around 24 years old. Denies gross hematuria. Last child birth Feb 2020. Has monthly menstrual period. Last 5-7 days. Typically heavy in the first 3 days. Will have to change ultra tampon every 2 hours. No known history of celiac disease.  Occasional alcohol.  Denies fever, chills, lightheadedness, dizziness, presyncope, or syncope.  Denies cold or flulike symptoms.  Denies chest pain, heart palpitations, shortness of breath, or cough.  Past Medical History:  Diagnosis Date  . Asthma   . Gallbladder sludge   . Gestational diabetes    diet controlled  . Postpartum depression     Past Surgical History:  Procedure Laterality Date  . ADENOIDECTOMY    . FRACTURE SURGERY     L elbow  . TONSILLECTOMY      Current Outpatient Medications  Medication Sig Dispense Refill  . albuterol (VENTOLIN HFA) 108 (90 Base) MCG/ACT inhaler Inhale into the lungs as needed for wheezing or shortness of breath.    Marland Kitchen ibuprofen (ADVIL,MOTRIN) 600 MG tablet Take 1 tablet (600 mg total) by mouth every 6 (six) hours. 30 tablet 0  . pantoprazole (PROTONIX)  40 MG tablet Take 1 tablet (40 mg total) by mouth daily before breakfast. 30 tablet 3   No current facility-administered medications for this visit.    Allergies as of 02/17/2019 - Review Complete 02/17/2019  Allergen Reaction Noted  . Atuss da [pse-bromphen-chlophedianol] Hives 03/26/2018    Family History  Problem Relation Age of Onset  . Hypertension Maternal Grandmother   . Hypertension Maternal Aunt   . Colon cancer Neg Hx     Social History    Socioeconomic History  . Marital status: Married    Spouse name: Not on file  . Number of children: 1  . Years of education: Not on file  . Highest education level: Not on file  Occupational History  . Not on file  Tobacco Use  . Smoking status: Current Some Day Smoker    Years: 1.00    Types: Cigarettes  . Smokeless tobacco: Never Used  Substance and Sexual Activity  . Alcohol use: Yes    Comment: Occasionally  . Drug use: No  . Sexual activity: Yes    Birth control/protection: None  Other Topics Concern  . Not on file  Social History Narrative  . Not on file   Social Determinants of Health   Financial Resource Strain: Low Risk   . Difficulty of Paying Living Expenses: Not hard at all  Food Insecurity: No Food Insecurity  . Worried About Programme researcher, broadcasting/film/video in the Last Year: Never true  . Ran Out of Food in the Last Year: Never true  Transportation Needs: Unknown  . Lack of Transportation (Medical): No  . Lack of Transportation (Non-Medical): Not on file  Physical Activity:   . Days of Exercise per Week: Not on file  . Minutes of Exercise per Session: Not on file  Stress: Stress Concern Present  . Feeling of Stress : To some extent  Social Connections:   . Frequency of Communication with Friends and Family: Not on file  . Frequency of Social Gatherings with Friends and Family: Not on file  . Attends Religious Services: Not on file  . Active Member of Clubs or Organizations: Not on file  . Attends Banker Meetings: Not on file  . Marital Status: Not on file  Intimate Partner Violence: Not At Risk  . Fear of Current or Ex-Partner: No  . Emotionally Abused: No  . Physically Abused: No  . Sexually Abused: No    Review of Systems: Gen: See HPI CV: HPI Resp: See HPI GI:  See HPI GU : Denies urinary burning, urinary frequency, urinary hesitancy. MS: Denies joint pain Derm: Denies rash Psych: Admits to anxiety. Denies depression.  Heme: See HPI   Physical Exam: BP (!) 139/91   Pulse (!) 108   Temp (!) 97.3 F (36.3 C) (Temporal)   Ht 5\' 1"  (1.549 m)   Wt 169 lb 6.4 oz (76.8 kg)   LMP 01/29/2019   Breastfeeding No   BMI 32.01 kg/m  General:   Alert and oriented. Pleasant and cooperative. Well-nourished and well-developed.  Head:  Normocephalic and atraumatic. Eyes:  Without icterus, sclera clear and conjunctiva pink.  Ears:  Normal auditory acuity. Lungs:  Clear to auscultation bilaterally. No wheezes, rales, or rhonchi. No distress.  Heart:  S1, S2 present without murmurs appreciated.  Abdomen:  +BS, soft, and non-distended. Moderate tenderness to palpation in the epigastric area. Minimal tenderness to deep palpation in RUQ. No HSM noted. No guarding or rebound. No masses appreciated.  Rectal:  Deferred  Msk:  Symmetrical without gross deformities. Normal posture. Extremities:  Without edema. Neurologic:  Alert and  oriented x4;  grossly normal neurologically. Skin:  Intact without significant lesions or rashes. Psych:  Normal mood and affect.

## 2019-02-18 ENCOUNTER — Telehealth: Payer: Self-pay | Admitting: Internal Medicine

## 2019-02-18 LAB — CBC WITH DIFFERENTIAL/PLATELET
Absolute Monocytes: 632 cells/uL (ref 200–950)
Basophils Absolute: 9 cells/uL (ref 0–200)
Basophils Relative: 0.1 %
Eosinophils Absolute: 383 cells/uL (ref 15–500)
Eosinophils Relative: 4.3 %
HCT: 34.9 % — ABNORMAL LOW (ref 35.0–45.0)
Hemoglobin: 10.8 g/dL — ABNORMAL LOW (ref 11.7–15.5)
Lymphs Abs: 2964 cells/uL (ref 850–3900)
MCH: 22.2 pg — ABNORMAL LOW (ref 27.0–33.0)
MCHC: 30.9 g/dL — ABNORMAL LOW (ref 32.0–36.0)
MCV: 71.8 fL — ABNORMAL LOW (ref 80.0–100.0)
MPV: 10.4 fL (ref 7.5–12.5)
Monocytes Relative: 7.1 %
Neutro Abs: 4913 cells/uL (ref 1500–7800)
Neutrophils Relative %: 55.2 %
Platelets: 302 10*3/uL (ref 140–400)
RBC: 4.86 10*6/uL (ref 3.80–5.10)
RDW: 16.9 % — ABNORMAL HIGH (ref 11.0–15.0)
Total Lymphocyte: 33.3 %
WBC: 8.9 10*3/uL (ref 3.8–10.8)

## 2019-02-18 LAB — IRON,TIBC AND FERRITIN PANEL
%SAT: 6 % (calc) — ABNORMAL LOW (ref 16–45)
Ferritin: 4 ng/mL — ABNORMAL LOW (ref 16–154)
Iron: 32 ug/dL — ABNORMAL LOW (ref 40–190)
TIBC: 500 mcg/dL (calc) — ABNORMAL HIGH (ref 250–450)

## 2019-02-18 LAB — IGA: Immunoglobulin A: 158 mg/dL (ref 47–310)

## 2019-02-18 LAB — TISSUE TRANSGLUTAMINASE, IGA: (tTG) Ab, IgA: 1 U/mL

## 2019-02-18 LAB — H. PYLORI BREATH TEST: H. pylori Breath Test: NOT DETECTED

## 2019-02-18 NOTE — Telephone Encounter (Signed)
Patient called asking about lab results, told her a nurse will be on contact when they are received

## 2019-02-18 NOTE — H&P (View-Only) (Signed)
Hemoglobin low at 10.8 with microcytic indices.  Iron panel with ferritin 4 (L), percent saturation 6% (L), iron 32 (L).  No evidence of celiac disease.  No H. Pylori.  Patient may start Protonix 40 mg daily 30 minutes before breakfast at this time. She also needs to start over-the-counter iron (should contain 65 mg iron) twice daily.  This may is cause dark stools or constipation.  She may take MiraLAX 1 capful daily in 8 ounces of water if she develops constipation.   We need to recheck her iron panel in 8 weeks.   I would also like to proceed with scheduling EGD + TCS for evaluation of IDA as discussed at office visit.  [facility] clinical pool: Please arrange EGD + TCS with propofol with Dr. Jena Gauss.  Diagnosis: IDA, epigastric pain, nausea without vomiting.  Hold iron x7 days prior to procedure.

## 2019-02-18 NOTE — Progress Notes (Signed)
Hemoglobin low at 10.8 with microcytic indices.  Iron panel with ferritin 4 (L), percent saturation 6% (L), iron 32 (L).  No evidence of celiac disease.  No H. Pylori.  Patient may start Protonix 40 mg daily 30 minutes before breakfast at this time. She also needs to start over-the-counter iron (should contain 65 mg iron) twice daily.  This may is cause dark stools or constipation.  She may take MiraLAX 1 capful daily in 8 ounces of water if she develops constipation.   We need to recheck her iron panel in 8 weeks.   I would also like to proceed with scheduling EGD + TCS for evaluation of IDA as discussed at office visit.  [facility] clinical pool: Please arrange EGD + TCS with propofol with Dr. Rourk.  Diagnosis: IDA, epigastric pain, nausea without vomiting.  Hold iron x7 days prior to procedure.

## 2019-02-18 NOTE — Telephone Encounter (Signed)
Documentation will be under result note. Lmom, waiting on a return call.

## 2019-02-19 ENCOUNTER — Other Ambulatory Visit: Payer: Self-pay

## 2019-02-19 DIAGNOSIS — D649 Anemia, unspecified: Secondary | ICD-10-CM

## 2019-02-22 ENCOUNTER — Other Ambulatory Visit: Payer: Self-pay

## 2019-02-22 MED ORDER — CLENPIQ 10-3.5-12 MG-GM -GM/160ML PO SOLN: 1.0000 | Freq: Once | ORAL | 0 refills | Status: AC

## 2019-02-25 ENCOUNTER — Telehealth: Payer: Self-pay | Admitting: Internal Medicine

## 2019-02-25 NOTE — Telephone Encounter (Signed)
Pt said she was returning a call. [phone]

## 2019-02-25 NOTE — Telephone Encounter (Signed)
Spoke with pt. Pt was seen 02/18/2019. Pt called to ask what does she need to do if she starts to have pain, nausea, until her procedure. Pt is taking the Pantoprazole 40 mg bid as directed. Pt has been on Pantoprazole for 1 week and she notices some improvement. Pt did have pain 2 days ago that was around an 8 that come and goes.  Per the ov note, If has persistent Right upper abdominal pain with nausea and vomiting, pt should proceed to the emergency room. Pt was advised of go to the ED per the note. Pt says she doesn't want to go to the ED, every time she has the pain and wants to ask [facility] if she should do something else besides go to the ED?

## 2019-02-25 NOTE — Telephone Encounter (Signed)
I am gad she is having some improvement. She has known gallstones; therefore, it is very possible her abdominal pain could also be related to her gallbladder. Last imaging was on 01/04/19 at [facility] which again revealed cholelithiasis. However, she was not having RUQ pain at that time. Is she still having pain? If so, we can go ahead and update a RUQ ultrasound to ensure her gallbladder is not inflamed.  She was instructed to go to the emergency department if she developed persistent right upper quadrant pain, nausea, vomiting as it is possible for a gallstone to get lodged in the duct coming from the gallbladder which is a surgical emergency.

## 2019-02-26 NOTE — Telephone Encounter (Signed)
Left a detailed message for pt on her VM with [facility] recommendations. Pt was advised to go to the ED if the pain rt upper quad was persistent, nausea ect occurs. Pt advised to call back if she is having pain to update imaging.

## 2019-03-01 ENCOUNTER — Other Ambulatory Visit: Payer: Self-pay

## 2019-03-01 ENCOUNTER — Encounter (HOSPITAL_COMMUNITY): Payer: Self-pay | Admitting: Emergency Medicine

## 2019-03-01 ENCOUNTER — Emergency Department (HOSPITAL_COMMUNITY)
Admission: EM | Admit: 2019-03-01 | Discharge: 2019-03-01 | Disposition: A | Discharge status: Home / Self Care | Payer: Medicaid Other | Attending: Emergency Medicine | Admitting: Emergency Medicine

## 2019-03-01 ENCOUNTER — Emergency Department (HOSPITAL_COMMUNITY): Payer: Medicaid Other

## 2019-03-01 DIAGNOSIS — Z79899 Other long term (current) drug therapy: Secondary | ICD-10-CM | POA: Diagnosis not present

## 2019-03-01 DIAGNOSIS — F17210 Nicotine dependence, cigarettes, uncomplicated: Secondary | ICD-10-CM | POA: Insufficient documentation

## 2019-03-01 DIAGNOSIS — J45909 Unspecified asthma, uncomplicated: Secondary | ICD-10-CM | POA: Insufficient documentation

## 2019-03-01 DIAGNOSIS — R1013 Epigastric pain: Secondary | ICD-10-CM | POA: Diagnosis not present

## 2019-03-01 LAB — CBC WITH DIFFERENTIAL/PLATELET
Abs Immature Granulocytes: 0.04 10*3/uL (ref 0.00–0.07)
Basophils Absolute: 0 10*3/uL (ref 0.0–0.1)
Basophils Relative: 0 %
Eosinophils Absolute: 0.4 10*3/uL (ref 0.0–0.5)
Eosinophils Relative: 5 %
HCT: 39.7 % (ref 36.0–46.0)
Hemoglobin: 11.6 g/dL — ABNORMAL LOW (ref 12.0–15.0)
Immature Granulocytes: 1 %
Lymphocytes Relative: 33 %
Lymphs Abs: 2.3 10*3/uL (ref 0.7–4.0)
MCH: 22.8 pg — ABNORMAL LOW (ref 26.0–34.0)
MCHC: 29.2 g/dL — ABNORMAL LOW (ref 30.0–36.0)
MCV: 78.1 fL — ABNORMAL LOW (ref 80.0–100.0)
Monocytes Absolute: 0.4 10*3/uL (ref 0.1–1.0)
Monocytes Relative: 6 %
Neutro Abs: 3.8 10*3/uL (ref 1.7–7.7)
Neutrophils Relative %: 55 %
Platelets: 372 10*3/uL (ref 150–400)
RBC: 5.08 MIL/uL (ref 3.87–5.11)
RDW: 21.2 % — ABNORMAL HIGH (ref 11.5–15.5)
WBC: 6.9 10*3/uL (ref 4.0–10.5)
nRBC: 0 % (ref 0.0–0.2)

## 2019-03-01 LAB — COMPREHENSIVE METABOLIC PANEL
ALT: 14 U/L (ref 0–44)
AST: 15 U/L (ref 15–41)
Albumin: 4.1 g/dL (ref 3.5–5.0)
Alkaline Phosphatase: 55 U/L (ref 38–126)
Anion gap: 9 (ref 5–15)
BUN: 14 mg/dL (ref 6–20)
CO2: 24 mmol/L (ref 22–32)
Calcium: 9.1 mg/dL (ref 8.9–10.3)
Chloride: 105 mmol/L (ref 98–111)
Creatinine, Ser: 0.55 mg/dL (ref 0.44–1.00)
GFR calc Af Amer: >60 mL/min (ref >60)
GFR calc non Af Amer: >60 mL/min (ref >60)
Glucose, Bld: 97 mg/dL (ref 70–99)
Potassium: 3.9 mmol/L (ref 3.5–5.1)
Sodium: 138 mmol/L (ref 135–145)
Total Bilirubin: 0.6 mg/dL (ref 0.3–1.2)
Total Protein: 7.4 g/dL (ref 6.5–8.1)

## 2019-03-01 LAB — I-STAT BETA HCG BLOOD, ED (MC, WL, AP ONLY): I-stat hCG, quantitative: <5 m[IU]/mL (ref <5)

## 2019-03-01 LAB — LIPASE, BLOOD: Lipase: 29 U/L (ref 11–51)

## 2019-03-01 MED ORDER — ONDANSETRON HCL 4 MG/2ML IJ SOLN
4.0000 mg | Freq: Once | INTRAMUSCULAR | Status: AC
  Administered 2019-03-01: 4 mg via INTRAVENOUS
  Filled 2019-03-01: qty 2

## 2019-03-01 MED ORDER — MORPHINE SULFATE (PF) 4 MG/ML IV SOLN
6.0000 mg | Freq: Once | INTRAVENOUS | Status: AC
  Administered 2019-03-01: 6 mg via INTRAVENOUS
  Filled 2019-03-01: qty 2

## 2019-03-01 MED ORDER — ONDANSETRON 8 MG PO TBDP: 8.0000 mg | ORAL_TABLET | Freq: Three times a day (TID) | ORAL | 0 refills | Status: DC | PRN

## 2019-03-01 MED ORDER — ACETAMINOPHEN ER 650 MG PO TBCR: 650.0000 mg | EXTENDED_RELEASE_TABLET | Freq: Three times a day (TID) | ORAL | 0 refills | Status: DC | PRN

## 2019-03-01 NOTE — ED Provider Notes (Signed)
7:20 AM-checkout from Dr. Rhunette Croft evaluate to evaluate after receiving ultrasound imaging to evaluate ongoing abdominal pain.  Plan will be to refer to general surgery for ongoing management, assuming no acute problems are found on the ultrasound.  Report: IMPRESSION:  Gallbladder stones and sludge without sonographic evidence of acute  cholecystitis.     The patient was evaluated by GI, recently for iron deficiency anemia, epigastric abdominal pain nausea and vomiting.  They schedule her for endoscopies upper and lower, 05/03/2019.  9:20 AM-I had a discussion with the patient regarding her pain.  She states that it is sharp pain in the epigastrium, that comes and goes without known causative factor.  Pain can be day or night and is unrelated to eating.  She is taking Protonix, and iron pills, prescribed about 10 days ago.  At this time she appears comfortable and does not express additional complaints.  We discussed the findings and recommended follow-up with general surgery for consideration of HIDA scan to assess gallbladder function.  It may help to attempt to get the endoscopies bumped up to an earlier date for definitive diagnosis of peptic ulcer disease symptoms.  The patient is stable for discharge.     Mancel Bale, MD 03/01/19 [phone]

## 2019-03-01 NOTE — Discharge Instructions (Addendum)
We evaluated you for abdominal pain.  The ultrasound shows gallstones and "sludge."  There was no inflammation or infection seen.  Treatment for this includes low-fat diet, and consideration for removal of the gallbladder.  Please follow-up with general surgery (Dr. Lovell Sheehan) for further evaluation of the gallbladder.  He may consider doing a HIDA scan to evaluate for the gallbladder function.  Call the GI doctor, to see if they can advance your endoscopies to a sooner date.  The Protonix you are taking may end up helping, and can be supplemented with a liquid antacid, like Maalox.  Tylenol is the best pain medicine to use.  Return here, if needed, for problems.

## 2019-03-01 NOTE — ED Notes (Signed)
[place] in to perform testing

## 2019-03-01 NOTE — ED Triage Notes (Signed)
Pt dx with gallstones in December. Seen by Rosalyn Charters and told if pain worse; to come to ED. Pt states that she has been having more frequent "flare-ups".

## 2019-03-01 NOTE — ED Provider Notes (Signed)
[facility] Provider Note   CSN: [id number] Arrival date & time: 03/01/19  0414     History Chief Complaint  Patient presents with  . Abdominal Pain    Mary Hull is a 24 y.o. female.  HPI    24 year old with history of gallbladder sludge comes in a chief complaint of abdominal pain.  Patient reports that she has been having abdominal pain off and on for the last several days.  She started having this current episode bother her around 3 PM.  The pain started improving later in the evening but it got intense again overnight and she could not sleep.  The pain is located primarily in the epigastric region.  The pain is nonradiating and described as sharp pain with no specific evoking, aggravating or relieving factors.   Patient was diagnosed with gallstones few weeks ago.  She has followed up with GI and they were planning on doing an endoscopy to evaluate for gastritis.  However the endoscopy appointment is not until April, and in the interim her pain has gotten worse.  Past Medical History:  Diagnosis Date  . Asthma   . Gallbladder sludge   . Gestational diabetes    diet controlled  . Postpartum depression     Patient Active Problem List   Diagnosis Date Noted  . Abdominal pain 02/17/2019  . Nausea without vomiting 02/17/2019  . Microcytic anemia 02/17/2019  . Elevated liver enzymes 03/18/2018  . Gallbladder sludge   . History of gestational diabetes 06/20/2016  . Abnormal findings on diagnostic imaging of gall bladder 03/27/2016    Past Surgical History:  Procedure Laterality Date  . ADENOIDECTOMY    . FRACTURE SURGERY     L elbow  . TONSILLECTOMY       OB History    Gravida  2   Para  2   Term  2   Preterm      AB      Living  2     SAB      TAB      Ectopic      Multiple  0   Live Births  2           Family History  Problem Relation Age of Onset  . Hypertension Maternal Grandmother   . Hypertension  Maternal Aunt   . Colon cancer Neg Hx     Social History   Tobacco Use  . Smoking status: Current Some Day Smoker    Years: 1.00    Types: Cigarettes  . Smokeless tobacco: Never Used  Substance Use Topics  . Alcohol use: Yes    Comment: Occasionally  . Drug use: No    Home Medications Prior to Admission medications   Medication Sig Start Date End Date Taking? Authorizing Provider  albuterol (VENTOLIN HFA) 108 (90 Base) MCG/ACT inhaler Inhale into the lungs as needed for wheezing or shortness of breath.    [provider]  ibuprofen (ADVIL,MOTRIN) 600 MG tablet Take 1 tablet (600 mg total) by mouth every 6 (six) hours. 03/27/18   Woodroe Mode, MD  pantoprazole (PROTONIX) 40 MG tablet Take 1 tablet (40 mg total) by mouth daily before breakfast. 02/17/19   Erenest Rasher, PA-C    Petrey [pse-bromphen-chlophedianol]  Review of Systems   Review of Systems  Constitutional: Positive for activity change.  Respiratory: Negative for shortness of breath.   Cardiovascular: Negative for chest pain.  Gastrointestinal: Positive for abdominal pain.  All other systems reviewed and are negative.   Physical Exam Updated Vital Signs BP 122/89   Pulse 89   Temp 98.1 F (36.7 C) (Oral)   Resp 18   Ht 5\' 1"  (1.549 m)   Wt 77 kg   LMP 02/24/2019 (Exact Date)   SpO2 100%   BMI 32.07 kg/m   Physical Exam Vitals and nursing note reviewed.  Constitutional:      Appearance: She is well-developed.  HENT:     Head: Normocephalic and atraumatic.  Cardiovascular:     Rate and Rhythm: Normal rate.  Pulmonary:     Effort: Pulmonary effort is normal.  Abdominal:     General: Bowel sounds are normal.     Tenderness: There is abdominal tenderness in the right upper quadrant and epigastric area. Negative signs include Murphy's sign.  Musculoskeletal:     Cervical back: Normal range of motion and neck supple.  Skin:    General: Skin is warm and dry.    Neurological:     Mental Status: She is alert and oriented to person, place, and time.     ED Results / Procedures / Treatments   Labs (all labs ordered are listed, but only abnormal results are displayed) Labs Reviewed  CBC WITH DIFFERENTIAL/PLATELET - Abnormal; Notable for the following components:      Result Value   Hemoglobin 11.6 (*)    MCV 78.1 (*)    MCH 22.8 (*)    MCHC 29.2 (*)    RDW 21.2 (*)    All other components within normal limits  COMPREHENSIVE METABOLIC PANEL  LIPASE, BLOOD  I-STAT BETA HCG BLOOD, ED ([facility], [facility], [facility] ONLY)    EKG None  Radiology No results found.  Procedures Procedures (including critical care time)  Medications Ordered in ED Medications  morphine 4 MG/ML injection 6 mg (6 mg Intravenous Given 03/01/19 0455)  ondansetron (ZOFRAN) injection 4 mg (4 mg Intravenous Given 03/01/19 0455)    ED Course  I have reviewed the triage vital signs and the nursing notes.  Pertinent labs & imaging results that were available during my care of the patient were reviewed by me and considered in my medical decision making (see chart for details).    MDM Rules/Calculators/A&P                      DDx includes: Pancreatitis Hepatobiliary pathology including cholecystitis Gastritis/PUD  Patient comes in a chief complaint of epigastric abdominal pain.  Differential mostly includes gastritis versus cholelithiasis.  Pregnancy test is negative.  Based on exam we are pretty convinced that she does not have cholecystitis.  However she is apprehensive about waiting until April for GI doctors to scope and then help her determine if cholelithiasis primary issue.  It might be worthwhile getting an ultrasound, and having her follow-up with general surgery.  HIDA scan can be ordered by them if needed.   Patient's ultrasound will be done in the morning.  Patient's care will be signed out to the incoming team.   Final Clinical Impression(s) / ED Diagnoses Final  diagnoses:  None    Rx / DC Orders ED Discharge Orders    None       May, MD 03/01/19 [phone]

## 2019-03-04 ENCOUNTER — Encounter (HOSPITAL_COMMUNITY): Payer: Self-pay

## 2019-03-04 ENCOUNTER — Other Ambulatory Visit: Payer: Self-pay | Admitting: Gastroenterology

## 2019-03-04 ENCOUNTER — Telehealth: Payer: Self-pay | Admitting: Internal Medicine

## 2019-03-04 ENCOUNTER — Other Ambulatory Visit: Payer: Self-pay

## 2019-03-04 DIAGNOSIS — R1013 Epigastric pain: Secondary | ICD-10-CM

## 2019-03-04 MED ORDER — LIDOCAINE VISCOUS HCL 2 % MT SOLN: OROMUCOSAL | 0 refills | Status: DC

## 2019-03-04 MED ORDER — SUCRALFATE 1 GM/10ML PO SUSP: 1.0000 g | Freq: Three times a day (TID) | ORAL | 1 refills | Status: DC

## 2019-03-04 NOTE — Telephone Encounter (Signed)
Lmom, waiting on a return call.  

## 2019-03-04 NOTE — Telephone Encounter (Signed)
Called pt, TCS/EGD w/Prop w/RMR moved up to 03/08/19 at 7:30am. She works at pharmacy and requested new instructions be faxed to [phone]. Instructions faxed. LMOVM for endo scheduler.

## 2019-03-04 NOTE — Telephone Encounter (Signed)
Pt is scheduled procedure with RMR on 05/03/2019. She went to the ER earlier this week due to abdominal pain and was told that she needed to have procedures moved up to something sooner. Please advise and call [phone] (forwarding to MB and AM)

## 2019-03-04 NOTE — Telephone Encounter (Signed)
COVID test 03/05/19 at 2:00pm. She will not need pre-op appt d/t recent labs. Pre-op nurse will call her tomorrow. Tried to call pt, no answer, LMOVM to inform her. MyChart message also sent.

## 2019-03-04 NOTE — Telephone Encounter (Signed)
Mary Hull   Spoke with patient.  She continues to have intermittent epigastric pain.  Minimal change since starting Protonix, but she is only been on this for about 2 weeks.  We will go ahead and add Carafate for now and lidocaine as needed.  She is advised to try lidocaine when the severe pain starts.  If this does not help, she is not to continue taking this.  Further recommendations to follow procedures.

## 2019-03-04 NOTE — Telephone Encounter (Signed)
Tried to call patient but no answer.   We can try adding Carafate 3 times daily before meals and at bedtime. I can also prescribe viscous lidocaine that she can try as needed when the pain starts to get severe. If this doesn't help, she likely needs a HIDA scan to evaluate her gallbladder as recommended by the ED as well. ED also stated she should follow-up with general surgery in 1 week.   We will see if her procedures can be moved up.   [facility] Clinical Pool: Can we try to move patients procedures up or at least get her on the cancellation list?

## 2019-03-04 NOTE — Telephone Encounter (Signed)
Noted  

## 2019-03-04 NOTE — Telephone Encounter (Signed)
Spoke with pt. Pt went to the ED this week due to her pain. Pt was given Morphine, nausea medication, blood was drawn and CT scan was done. Pt states she was told to call our office to see if her procedure could be moved up. Pt isn't sure what needs to happen if pain starts again, since pt was asked to f/u with our office.

## 2019-03-05 ENCOUNTER — Other Ambulatory Visit: Payer: Self-pay

## 2019-03-05 ENCOUNTER — Encounter (HOSPITAL_COMMUNITY)
Admission: RE | Admit: 2019-03-05 | Discharge: 2019-03-05 | Disposition: A | Discharge status: Home / Self Care | Payer: Medicaid Other | Source: Ambulatory Visit | Attending: Internal Medicine | Admitting: Internal Medicine

## 2019-03-05 ENCOUNTER — Other Ambulatory Visit (HOSPITAL_COMMUNITY)
Admission: RE | Admit: 2019-03-05 | Discharge: 2019-03-05 | Disposition: A | Discharge status: Home / Self Care | Payer: Medicaid Other | Source: Ambulatory Visit | Attending: Internal Medicine | Admitting: Internal Medicine

## 2019-03-05 DIAGNOSIS — Z20822 Contact with and (suspected) exposure to covid-19: Secondary | ICD-10-CM | POA: Diagnosis not present

## 2019-03-05 DIAGNOSIS — Z01812 Encounter for preprocedural laboratory examination: Secondary | ICD-10-CM | POA: Insufficient documentation

## 2019-03-05 LAB — SARS CORONAVIRUS 2 (TAT 6-24 HRS): SARS Coronavirus 2: NEGATIVE

## 2019-03-08 ENCOUNTER — Ambulatory Visit (HOSPITAL_COMMUNITY): Payer: Medicaid Other | Admitting: Anesthesiology

## 2019-03-08 ENCOUNTER — Ambulatory Visit (HOSPITAL_COMMUNITY)
Admission: RE | Admit: 2019-03-08 | Discharge: 2019-03-08 | Disposition: A | Discharge status: Home / Self Care | Payer: Medicaid Other | Attending: Internal Medicine | Admitting: Internal Medicine

## 2019-03-08 ENCOUNTER — Encounter (HOSPITAL_COMMUNITY)
Admission: RE | Disposition: A | Discharge status: Home / Self Care | Payer: Self-pay | Source: Home / Self Care | Attending: Internal Medicine

## 2019-03-08 ENCOUNTER — Encounter (HOSPITAL_COMMUNITY): Payer: Self-pay | Admitting: Internal Medicine

## 2019-03-08 ENCOUNTER — Other Ambulatory Visit: Payer: Self-pay

## 2019-03-08 DIAGNOSIS — J45909 Unspecified asthma, uncomplicated: Secondary | ICD-10-CM | POA: Insufficient documentation

## 2019-03-08 DIAGNOSIS — Z79899 Other long term (current) drug therapy: Secondary | ICD-10-CM | POA: Insufficient documentation

## 2019-03-08 DIAGNOSIS — R110 Nausea: Secondary | ICD-10-CM | POA: Insufficient documentation

## 2019-03-08 DIAGNOSIS — D509 Iron deficiency anemia, unspecified: Secondary | ICD-10-CM | POA: Insufficient documentation

## 2019-03-08 DIAGNOSIS — R1013 Epigastric pain: Secondary | ICD-10-CM | POA: Diagnosis present

## 2019-03-08 DIAGNOSIS — K2100 Gastro-esophageal reflux disease with esophagitis, without bleeding: Secondary | ICD-10-CM | POA: Insufficient documentation

## 2019-03-08 HISTORY — PX: ESOPHAGOGASTRODUODENOSCOPY (EGD) WITH PROPOFOL: SHX5813

## 2019-03-08 HISTORY — PX: COLONOSCOPY WITH PROPOFOL: SHX5780

## 2019-03-08 SURGERY — COLONOSCOPY WITH PROPOFOL
Anesthesia: General

## 2019-03-08 MED ORDER — PROPOFOL 500 MG/50ML IV EMUL
INTRAVENOUS | Status: DC | PRN
  Administered 2019-03-08: 200 ug/kg/min via INTRAVENOUS

## 2019-03-08 MED ORDER — CHLORHEXIDINE GLUCONATE CLOTH 2 % EX PADS: 6.0000 | MEDICATED_PAD | Freq: Once | CUTANEOUS | Status: DC

## 2019-03-08 MED ORDER — PROPOFOL 10 MG/ML IV BOLUS
INTRAVENOUS | Status: AC
  Filled 2019-03-08: qty 20

## 2019-03-08 MED ORDER — PROPOFOL 10 MG/ML IV BOLUS
INTRAVENOUS | Status: AC
  Filled 2019-03-08: qty 40

## 2019-03-08 MED ORDER — MIDAZOLAM HCL 2 MG/2ML IJ SOLN
INTRAMUSCULAR | Status: AC
  Filled 2019-03-08: qty 2

## 2019-03-08 MED ORDER — MIDAZOLAM HCL 5 MG/5ML IJ SOLN
INTRAMUSCULAR | Status: DC | PRN
  Administered 2019-03-08: 2 mg via INTRAVENOUS

## 2019-03-08 MED ORDER — LACTATED RINGERS IV SOLN: Freq: Once | INTRAVENOUS | Status: AC

## 2019-03-08 MED ORDER — PROPOFOL 10 MG/ML IV BOLUS
INTRAVENOUS | Status: DC | PRN
  Administered 2019-03-08: 100 mg via INTRAVENOUS

## 2019-03-08 NOTE — Interval H&P Note (Signed)
History and Physical Interval Note:  03/08/2019 7:27 AM  Mary Hull  has presented today for surgery, with the diagnosis of iron deficiency anemia, epigastric pain, nausea without vomiting.  The various methods of treatment have been discussed with the patient and family. After consideration of risks, benefits and other options for treatment, the patient has consented to  Procedure(s) with comments: COLONOSCOPY WITH PROPOFOL (N/A) - 9:30am ESOPHAGOGASTRODUODENOSCOPY (EGD) WITH PROPOFOL (N/A) as a surgical intervention.  The patient's history has been reviewed, patient examined, no change in status, stable for surgery.  I have reviewed the patient's chart and labs.  Questions were answered to the patient's satisfaction.     Mary Hull    No change.  Patient denies dysphagia.  Rare NSAID use.  EGD and colonoscopy per plan today. Patient endorses heavy menses.  The risks, benefits, limitations, imponderables and alternatives regarding both EGD and colonoscopy have been reviewed with the patient. Questions have been answered. All parties agreeable.

## 2019-03-08 NOTE — Transfer of Care (Signed)
Immediate Anesthesia Transfer of Care Note  Patient: Mary Hull  Procedure(s) Performed: COLONOSCOPY WITH PROPOFOL (N/A ) ESOPHAGOGASTRODUODENOSCOPY (EGD) WITH PROPOFOL (N/A )  Patient Location: PACU  Anesthesia Type:MAC  Level of Consciousness: awake, alert  and oriented  Airway & Oxygen Therapy: Patient Spontanous Breathing  Post-op Assessment: Report given to RN, Post -op Vital signs reviewed and stable and Patient moving all extremities X 4  Post vital signs: Reviewed and stable  Last Vitals:  Vitals Value Taken Time  BP    Temp    Pulse    Resp    SpO2      Last Pain:  Vitals:   03/08/19 0737  TempSrc:   PainSc: 0-No pain      Patients Stated Pain Goal: 7 (81/85/63 1497)  Complications: No apparent anesthesia complications

## 2019-03-08 NOTE — Anesthesia Postprocedure Evaluation (Signed)
Anesthesia Post Note  Patient: Mary Hull  Procedure(s) Performed: COLONOSCOPY WITH PROPOFOL (N/A ) ESOPHAGOGASTRODUODENOSCOPY (EGD) WITH PROPOFOL (N/A )  Patient location during evaluation: PACU Anesthesia Type: MAC Level of consciousness: awake, oriented and awake and alert Pain management: pain level controlled Vital Signs Assessment: post-procedure vital signs reviewed and stable Respiratory status: spontaneous breathing, respiratory function stable and nonlabored ventilation Cardiovascular status: stable Postop Assessment: no apparent nausea or vomiting Anesthetic complications: no     Last Vitals:  Vitals:   03/08/19 0639  BP: 130/78  Pulse: 74  Resp: 18  Temp: 36.7 C  SpO2: 100%    Last Pain:  Vitals:   03/08/19 0737  TempSrc:   PainSc: 0-No pain                 Kingston Shawgo

## 2019-03-08 NOTE — Anesthesia Preprocedure Evaluation (Signed)
Anesthesia Evaluation  Patient identified by MRN, date of birth, ID band Patient awake    Reviewed: Allergy & Precautions, NPO status , Patient's Chart, lab work & pertinent test results  Airway Mallampati: II  TM Distance: >3 FB Neck ROM: Full    Dental  (+) Dental Advisory Given, Poor Dentition, Chipped Upper front bridge:   Pulmonary asthma , Current SmokerPatient did not abstain from smoking.,    Pulmonary exam normal breath sounds clear to auscultation       Cardiovascular Exercise Tolerance: Good Normal cardiovascular exam Rhythm:Regular Rate:Normal  15-Sep-2016 18:03:22 [facility] ROUTINE RECORD Right and left arm electrode reversal, interpretation assumes no reversal Sinus tachycardia Right axis deviation Abnormal T, consider ischemia, lateral leads Baseline wander in lead(s) V2 No old tracing to compare Confirmed by Eber Hong ([id number]) on 09/15/2016 6:17:07 PM   Neuro/Psych PSYCHIATRIC DISORDERS Depression negative neurological ROS     GI/Hepatic Neg liver ROS, Bowel prep,GERD (denied GERD, h/o abdominal pain)  Medicated,  Endo/Other  diabetes, Gestational  Renal/GU negative Renal ROS     Musculoskeletal negative musculoskeletal ROS (+)   Abdominal   Peds  Hematology  (+) anemia ,   Anesthesia Other Findings   Reproductive/Obstetrics negative OB ROS                             Anesthesia Physical Anesthesia Plan  ASA: II  Anesthesia Plan: General   Post-op Pain Management:    Induction: Intravenous  PONV Risk Score and Plan: 0 and TIVA  Airway Management Planned: Nasal Cannula, Simple Face Mask and Natural Airway  Additional Equipment:   Intra-op Plan:   Post-operative Plan:   Informed Consent: I have reviewed the patients History and Physical, chart, labs and discussed the procedure including the risks, benefits and alternatives for the  proposed anesthesia with the patient or authorized representative who has indicated his/her understanding and acceptance.     Dental advisory given  Plan Discussed with: CRNA and Surgeon  Anesthesia Plan Comments:         Anesthesia Quick Evaluation

## 2019-03-08 NOTE — Op Note (Signed)
[facility] Patient Name: Mary Hull Procedure Date: 03/08/2019 7:02 AM MRN: [id number] Date of Birth: Jun 27, 1995 Attending MD: Norvel Richards , MD CSN: [id number] Age: 24 Admit Type: Outpatient Procedure:                Upper GI endoscopy Indications:              Epigastric abdominal pain Providers:                Norvel Richards, MD, Jeanann Lewandowsky. Sharon Seller, RN,                            Raphael Gibney, Technician Referring MD:              Medicines:                Propofol per Anesthesia Complications:            No immediate complications. Estimated Blood Loss:     Estimated blood loss: none. Procedure:                Pre-Anesthesia Assessment:                           - Prior to the procedure, a History and Physical                            was performed, and patient medications and                            allergies were reviewed. The patient's tolerance of                            previous anesthesia was also reviewed. The risks                            and benefits of the procedure and the sedation                            options and risks were discussed with the patient.                            All questions were answered, and informed consent                            was obtained. Prior Anticoagulants: The patient has                            taken no previous anticoagulant or antiplatelet                            agents. ASA Grade Assessment: II - A patient with                            mild systemic disease. After reviewing the risks  and benefits, the patient was deemed in                            satisfactory condition to undergo the procedure.                           After obtaining informed consent, the endoscope was                            passed under direct vision. Throughout the                            procedure, the patient's blood pressure, pulse, and                            oxygen  saturations were monitored continuously. The                            [id number]-[id number] ([id number]) scope was introduced through the                            mouth, and advanced to the third part of duodenum.                            The upper GI endoscopy was accomplished without                            difficulty. The patient tolerated the procedure                            well. Scope In: 7:41:52 AM Scope Out: 7:44:46 AM Total Procedure Duration: 0 hours 2 minutes 54 seconds  Findings:      Focal erosions seen within 5 mm of the GE junction. No Barrett's       epithelium seen.      The entire examined stomach was normal.      The duodenal bulb, second portion of the duodenum and third portion of       the duodenum were normal. Impression:               -Mild erosive reflux esophagitis. Normal stomach..                           - Normal duodenal bulb, second portion of the                            duodenum and third portion of the duodenum.                           - No specimens collected.                           I suspect some of her epigastric pain may be  related to reflux. She has gallstones. Gallbladder                            could be a contributing factor to her recent                            abdominal pain. However, we need to continue in the                            direction of treating reflux at this time. Moderate Sedation:      Moderate (conscious) sedation was administered by the endoscopy nurse       and supervised by the endoscopist. The following parameters were       monitored: oxygen saturation, heart rate, blood pressure, respiratory       rate, EKG, adequacy of pulmonary ventilation, and response to care. Recommendation:           - Patient has a contact number available for                            emergencies. The signs and symptoms of potential                            delayed complications were discussed with the                             patient. Return to normal activities tomorrow.                            Written discharge instructions were provided to the                            patient.                           - Advance diet as tolerated. Increase Protonix to                            40 mg twice daily. Office visit with [place] in 6 weeks.                            See colonoscopy report. Procedure Code(s):        --- Professional ---                           [phone], Esophagogastroduodenoscopy, flexible,                            transoral; diagnostic, including collection of                            specimen(s) by brushing or washing, when performed                            (separate procedure) Diagnosis Code(s):        ---  Professional ---                           R10.13, Epigastric pain CPT copyright 2019 [employer]. All rights reserved. The codes documented in this report are preliminary and upon coder review may  be revised to meet current compliance requirements. Gerrit Friends. Anitta Tenny, MD Gennette Pac, MD 03/08/2019 8:17:11 AM This report has been signed electronically. Number of Addenda: 0

## 2019-03-08 NOTE — Op Note (Signed)
[facility] Patient Name: Mary Hull Procedure Date: 03/08/2019 7:47 AM MRN: [id number] Date of Birth: Jun 25, 1995 Attending MD: Gennette Pac , MD CSN: [id number] Age: 24 Admit Type: Outpatient Procedure:                Colonoscopy Indications:              Unexplained iron deficiency anemia Providers:                Gennette Pac, MD, Loma Messing B. Patsy Lager, RN,                            Pandora Leiter, Technician Referring MD:              Medicines:                Propofol per Anesthesia Complications:            No immediate complications. Estimated Blood Loss:     Estimated blood loss: none. Procedure:                Pre-Anesthesia Assessment:                           - Prior to the procedure, a History and Physical                            was performed, and patient medications and                            allergies were reviewed. The patient's tolerance of                            previous anesthesia was also reviewed. The risks                            and benefits of the procedure and the sedation                            options and risks were discussed with the patient.                            All questions were answered, and informed consent                            was obtained. Prior Anticoagulants: The patient has                            taken no previous anticoagulant or antiplatelet                            agents. ASA Grade Assessment: II - A patient with                            mild systemic disease. After reviewing the risks  and benefits, the patient was deemed in                            satisfactory condition to undergo the procedure.                           After obtaining informed consent, the colonoscope                            was passed under direct vision. Throughout the                            procedure, the patient's blood pressure, pulse, and   oxygen saturations were monitored continuously. The                            CF-[id number] ([id number]) scope was introduced through                            the anus and advanced to the 5 cm into the ileum.                            The colonoscopy was performed without difficulty.                            The patient tolerated the procedure well. The                            quality of the bowel preparation was adequate. Scope In: 7:50:49 AM Scope Out: 8:02:35 AM Scope Withdrawal Time: 0 hours 7 minutes 25 seconds  Total Procedure Duration: 0 hours 11 minutes 46 seconds  Findings:      The perianal and digital rectal examinations were normal.      The colon (entire examined portion) appeared normal. Distal 5 cm of TI       appeared normal.      The retroflexed view of the distal rectum and anal verge was normal and       showed no anal or rectal abnormalities. Impression:               - The entire examined colon is normal.                           - The distal rectum and anal verge are normal on                            retroflexion view.                           - No specimens collected.                           I suspect iron deficiency anemia is related to                            patient's endorsed history of heavy  menstrual                            cycles for years. An element of iron malabsorption                            not excluded. Celiac screen negative. Moderate Sedation:      Moderate (conscious) sedation was personally administered by an       anesthesia professional. The following parameters were monitored: oxygen       saturation, heart rate, blood pressure, respiratory rate, EKG, adequacy       of pulmonary ventilation, and response to care. Recommendation:           - Patient has a contact number available for                            emergencies. The signs and symptoms of potential                            delayed complications were discussed with  the                            patient. Return to normal activities tomorrow.                            Written discharge instructions were provided to the                            patient.                           - Advance diet as tolerated.                           - Continue present medications. Increase Protonix                            to 40 mg twice daily (see EGD report)                           - Repeat colonoscopy at age 63 for screening                            purposes.                           - Return to GI office in 6 weeks. She needs to                            establish patient ship with her primary care                            physician. As explained to her and her spouse,  deficiency anemia due to malabsorption and/or                            menstrual losses needs to be further addressed by                            primary care physician. Further GI evaluation                            needed other than following up on her GERD history                            of gallstones. Office visit with [place] in 6 weeks. Procedure Code(s):        --- Professional ---                           [phone], Colonoscopy, flexible; diagnostic, including                            collection of specimen(s) by brushing or washing,                            when performed (separate procedure) Diagnosis Code(s):        --- Professional ---                           D50.9, Iron deficiency anemia, unspecified CPT copyright 2019 American Medical Association. All rights reserved. The codes documented in this report are preliminary and upon coder review may  be revised to meet current compliance requirements. Cristopher Estimable. [name], MD Norvel Richards, MD 03/08/2019 8:21:28 AM This report has been signed electronically. Number of Addenda: 0

## 2019-03-08 NOTE — Discharge Instructions (Signed)
Colonoscopy Discharge Instructions  Read the instructions outlined below and refer to this sheet in the next few weeks. These discharge instructions provide you with general information on caring for yourself after you leave the hospital. Your doctor may also give you specific instructions. While your treatment has been planned according to the most current medical practices available, unavoidable complications occasionally occur. If you have any problems or questions after discharge, call Dr. Gala Romney at [phone]. ACTIVITY  You may resume your regular activity, but move at a slower pace for the next 24 hours.   Take frequent rest periods for the next 24 hours.   Walking will help get rid of the air and reduce the bloated feeling in your belly (abdomen).   No driving for 24 hours (because of the medicine (anesthesia) used during the test).    Do not sign any important legal documents or operate any machinery for 24 hours (because of the anesthesia used during the test).  NUTRITION  Drink plenty of fluids.   You may resume your normal diet as instructed by your doctor.   Begin with a light meal and progress to your normal diet. Heavy or fried foods are harder to digest and may make you feel sick to your stomach (nauseated).   Avoid alcoholic beverages for 24 hours or as instructed.  MEDICATIONS  You may resume your normal medications unless your doctor tells you otherwise.  WHAT YOU CAN EXPECT TODAY  Some feelings of bloating in the abdomen.   Passage of more gas than usual.   Spotting of blood in your stool or on the toilet paper.  IF YOU HAD POLYPS REMOVED DURING THE COLONOSCOPY:  No aspirin products for 7 days or as instructed.   No alcohol for 7 days or as instructed.   Eat a soft diet for the next 24 hours.  FINDING OUT THE RESULTS OF YOUR TEST Not all test results are available during your visit. If your test results are not back during the visit, make an appointment  with your caregiver to find out the results. Do not assume everything is normal if you have not heard from your caregiver or the medical facility. It is important for you to follow up on all of your test results.  SEEK IMMEDIATE MEDICAL ATTENTION IF:  You have more than a spotting of blood in your stool.   Your belly is swollen (abdominal distention).   You are nauseated or vomiting.   You have a temperature over 101.   You have abdominal pain or discomfort that is severe or gets worse throughout the day.    EGD Discharge instructions Please read the instructions outlined below and refer to this sheet in the next few weeks. These discharge instructions provide you with general information on caring for yourself after you leave the hospital. Your doctor may also give you specific instructions. While your treatment has been planned according to the most current medical practices available, unavoidable complications occasionally occur. If you have any problems or questions after discharge, please call your doctor. ACTIVITY  You may resume your regular activity but move at a slower pace for the next 24 hours.   Take frequent rest periods for the next 24 hours.   Walking will help expel (get rid of) the air and reduce the bloated feeling in your abdomen.   No driving for 24 hours (because of the anesthesia (medicine) used during the test).   You may shower.   Do not sign  any important legal documents or operate any machinery for 24 hours (because of the anesthesia used during the test).  NUTRITION  Drink plenty of fluids.   You may resume your normal diet.   Begin with a light meal and progress to your normal diet.   Avoid alcoholic beverages for 24 hours or as instructed by your caregiver.  MEDICATIONS  You may resume your normal medications unless your caregiver tells you otherwise.  WHAT YOU CAN EXPECT TODAY  You may experience abdominal discomfort such as a feeling of  fullness or "gas" pains.  FOLLOW-UP  Your doctor will discuss the results of your test with you.  SEEK IMMEDIATE MEDICAL ATTENTION IF ANY OF THE FOLLOWING OCCUR:  Excessive nausea (feeling sick to your stomach) and/or vomiting.   Severe abdominal pain and distention (swelling).   Trouble swallowing.   Temperature over 101 F (37.8 C).   Rectal bleeding or vomiting of blood.   GERD information provided  You do have reflux esophagitis which may have much to do with your epigastric pain.  You may or may not have be having symptoms coming from your gallbladder  For now, increase Protonix to 40 mg twice daily  Office visit with [place] in 6 weeks  You will need a separate evaluation for your iron deficiency anemia..  May be more related to heavy menses than anything else.  You may not be absorbing iron well.  A primary care will help with further evaluation.  You should find a primary care physician  At patient request, I called Kirk Ruths, husband, [phone] and reviewed findings and recommendations.    Gastroesophageal Reflux Disease, Adult Gastroesophageal reflux (GER) happens when acid from the stomach flows up into the tube that connects the mouth and the stomach (esophagus). Normally, food travels down the esophagus and stays in the stomach to be digested. With GER, food and stomach acid sometimes move back up into the esophagus. You may have a disease called gastroesophageal reflux disease (GERD) if the reflux:  Happens often.  Causes frequent or very bad symptoms.  Causes problems such as damage to the esophagus. When this happens, the esophagus becomes sore and swollen (inflamed). Over time, GERD can make small holes (ulcers) in the lining of the esophagus. What are the causes? This condition is caused by a problem with the muscle between the esophagus and the stomach. When this muscle is weak or not normal, it does not close properly to keep food and acid from coming back up  from the stomach. The muscle can be weak because of:  Tobacco use.  Pregnancy.  Having a certain type of hernia (hiatal hernia).  Alcohol use.  Certain foods and drinks, such as coffee, chocolate, onions, and peppermint. What increases the risk? You are more likely to develop this condition if you:  Are overweight.  Have a disease that affects your connective tissue.  Use NSAID medicines. What are the signs or symptoms? Symptoms of this condition include:  Heartburn.  Difficult or painful swallowing.  The feeling of having a lump in the throat.  A bitter taste in the mouth.  Bad breath.  Having a lot of saliva.  Having an upset or bloated stomach.  Belching.  Chest pain. Different conditions can cause chest pain. Make sure you see your doctor if you have chest pain.  Shortness of breath or noisy breathing (wheezing).  Ongoing (chronic) cough or a cough at night.  Wearing away of the surface of teeth (tooth enamel).  Weight loss. How is this treated? Treatment will depend on how bad your symptoms are. Your doctor may suggest:  Changes to your diet.  Medicine.  Surgery. Follow these instructions at home: Eating and drinking   Follow a diet as told by your doctor. You may need to avoid foods and drinks such as: ? Coffee and tea (with or without caffeine). ? Drinks that contain alcohol. ? Energy drinks and sports drinks. ? Bubbly (carbonated) drinks or sodas. ? Chocolate and cocoa. ? Peppermint and mint flavorings. ? Garlic and onions. ? Horseradish. ? Spicy and acidic foods. These include peppers, chili powder, curry powder, vinegar, hot sauces, and BBQ sauce. ? Citrus fruit juices and citrus fruits, such as oranges, lemons, and limes. ? Tomato-based foods. These include red sauce, chili, salsa, and pizza with red sauce. ? Fried and fatty foods. These include donuts, french fries, potato chips, and high-fat dressings. ? High-fat meats. These  include hot dogs, rib eye steak, sausage, ham, and bacon. ? High-fat dairy items, such as whole milk, butter, and cream cheese.  Eat small meals often. Avoid eating large meals.  Avoid drinking large amounts of liquid with your meals.  Avoid eating meals during the 2-3 hours before bedtime.  Avoid lying down right after you eat.  Do not exercise right after you eat. Lifestyle   Do not use any products that contain nicotine or tobacco. These include cigarettes, e-cigarettes, and chewing tobacco. If you need help quitting, ask your doctor.  Try to lower your stress. If you need help doing this, ask your doctor.  If you are overweight, lose an amount of weight that is healthy for you. Ask your doctor about a safe weight loss goal. General instructions  Pay attention to any changes in your symptoms.  Take over-the-counter and prescription medicines only as told by your doctor. Do not take aspirin, ibuprofen, or other NSAIDs unless your doctor says it is okay.  Wear loose clothes. Do not wear anything tight around your waist.  Raise (elevate) the head of your bed about 6 inches (15 cm).  Avoid bending over if this makes your symptoms worse.  Keep all follow-up visits as told by your doctor. This is important. Contact a doctor if:  You have new symptoms.  You lose weight and you do not know why.  You have trouble swallowing or it hurts to swallow.  You have wheezing or a cough that keeps happening.  Your symptoms do not get better with treatment.  You have a hoarse voice. Get help right away if:  You have pain in your arms, neck, jaw, teeth, or back.  You feel sweaty, dizzy, or light-headed.  You have chest pain or shortness of breath.  You throw up (vomit) and your throw-up looks like blood or coffee grounds.  You pass out (faint).  Your poop (stool) is bloody or black.  You cannot swallow, drink, or eat. Summary  If a person has gastroesophageal reflux  disease (GERD), food and stomach acid move back up into the esophagus and cause symptoms or problems such as damage to the esophagus.  Treatment will depend on how bad your symptoms are.  Follow a diet as told by your doctor.  Take all medicines only as told by your doctor. This information is not intended to replace advice given to you by your health care provider. Make sure you discuss any questions you have with your health care provider. Document Revised: 07/23/2017 Document Reviewed: 07/23/2017 Elsevier Patient  Education  2020 [employer]    Monitored Anesthesia Care, Care After These instructions provide you with information about caring for yourself after your procedure. Your health care provider may also give you more specific instructions. Your treatment has been planned according to current medical practices, but problems sometimes occur. Call your health care provider if you have any problems or questions after your procedure. What can I expect after the procedure? After your procedure, you may:  Feel sleepy for several hours.  Feel clumsy and have poor balance for several hours.  Feel forgetful about what happened after the procedure.  Have poor judgment for several hours.  Feel nauseous or vomit.  Have a sore throat if you had a breathing tube during the procedure. Follow these instructions at home: For at least 24 hours after the procedure:      Have a responsible adult stay with you. It is important to have someone help care for you until you are awake and alert.  Rest as needed.  Do not: ? Participate in activities in which you could fall or become injured. ? Drive. ? Use heavy machinery. ? Drink alcohol. ? Take sleeping pills or medicines that cause drowsiness. ? Make important decisions or sign legal documents. ? Take care of children on your own. Eating and drinking  Follow the diet that is recommended by your health care provider.  If you  vomit, drink water, juice, or soup when you can drink without vomiting.  Make sure you have little or no nausea before eating solid foods. General instructions  Take over-the-counter and prescription medicines only as told by your health care provider.  If you have sleep apnea, surgery and certain medicines can increase your risk for breathing problems. Follow instructions from your health care provider about wearing your sleep device: ? Anytime you are sleeping, including during daytime naps. ? While taking prescription pain medicines, sleeping medicines, or medicines that make you drowsy.  If you smoke, do not smoke without supervision.  Keep all follow-up visits as told by your health care provider. This is important. Contact a health care provider if:  You keep feeling nauseous or you keep vomiting.  You feel light-headed.  You develop a rash.  You have a fever. Get help right away if:  You have trouble breathing. Summary  For several hours after your procedure, you may feel sleepy and have poor judgment.  Have a responsible adult stay with you for at least 24 hours or until you are awake and alert. This information is not intended to replace advice given to you by your health care provider. Make sure you discuss any questions you have with your health care provider. Document Revised: 04/14/2017 Document Reviewed: 05/07/2015 Elsevier Patient Education  2020 [employer].

## 2019-03-31 ENCOUNTER — Other Ambulatory Visit: Payer: Self-pay | Admitting: *Deleted

## 2019-03-31 DIAGNOSIS — D649 Anemia, unspecified: Secondary | ICD-10-CM

## 2019-04-01 ENCOUNTER — Encounter: Payer: Self-pay | Admitting: *Deleted

## 2019-04-13 ENCOUNTER — Encounter: Payer: Self-pay | Admitting: Gastroenterology

## 2019-04-13 NOTE — Progress Notes (Incomplete)
? ? ?  Referring Provider: No ref. provider found ?Primary Care Physician:  Patient, No Pcp Per ?Primary GI Physician: Dr. Marland Kitchen ? ?No chief complaint on file. ? ? ?HPI:   ?Mary Hull is a 24 y.o. female presenting today for follow-up of epigastric pain, nausea without vomiting, and microcytic anemia.   ? ?She was last seen in our office on 02/17/2019 for the same.  She had been seen at  [facility] on 01/04/2019 for urinary urgency/frequency and right posterior flank pain.  CT with multiple bilateral renal calculi and cholelithiasis.  Hemoglobin 9.9 with microcytic indices (down from 11 range in February 2020 at the time of childbirth).  Per patient, she passed a stone while in the ED and had resolution of right-sided flank pain.  1-2 weeks after ED visit, she started having intermittent daily epigastric pain.  Sometimes after eating but not always.  Associated nausea without vomiting.  No RUQ pain. Denied GERD symptoms or any other significant upper or lower GI symptoms.  Admitted to 600-800 mg ibuprofen 3 times daily for abdominal pain.  Regarding anemia, she denied gross hematuria.  Reported monthly menstrual cycles with heavy flow for the first 3 days.  Felt epigastric pain in the setting of microcytic anemia was likely secondary to upper GI source rather than biliary etiology.  Plans to evaluate for H. pylori and celiac disease. Update CBC and obtain iron panel. If iron deficiency is present, proceed with EGD plus TCS. Would start Protonix 40 mg daily after completion of H. pylori breath test.  Advised to avoid NSAIDs.  ? ?Labs completed 02/17/2019.  Hemoglobin up to 10.8.  Microcytic indices persist.  Ferritin 4, iron 32, percent saturation 6%.  Celiac screen negative.  H. pylori breath test negative.  She was advised to start Protonix 40 mg daily as well as OTC iron twice daily and proceed with EGD plus TCS for evaluation of IDA.  Hold iron 7 days prior to procedures. ? ? Patient presented to the ED on 03/01/2019 for worsening epigastric pain.  Ultrasound

## 2019-04-14 ENCOUNTER — Ambulatory Visit: Payer: Medicaid Other | Admitting: Gastroenterology

## 2019-04-14 ENCOUNTER — Telehealth: Payer: Self-pay | Admitting: Internal Medicine

## 2019-04-14 ENCOUNTER — Encounter: Payer: Self-pay | Admitting: Internal Medicine

## 2019-04-14 NOTE — Telephone Encounter (Signed)
Patient was a no show and letter sent  °

## 2019-04-14 NOTE — Telephone Encounter (Signed)
Noted  

## 2019-04-29 ENCOUNTER — Other Ambulatory Visit (HOSPITAL_COMMUNITY): Payer: Medicaid Other

## 2019-05-05 ENCOUNTER — Ambulatory Visit: Payer: Medicaid Other | Admitting: Adult Health

## 2019-06-02 IMAGING — DX DG CHEST 2V
2 series · 2 of 2 positions shown · non-contrast
Comparison: 09/15/2016

CLINICAL DATA: Cough and short of breath

EXAM:
CHEST - 2 VIEW

[chest pa]
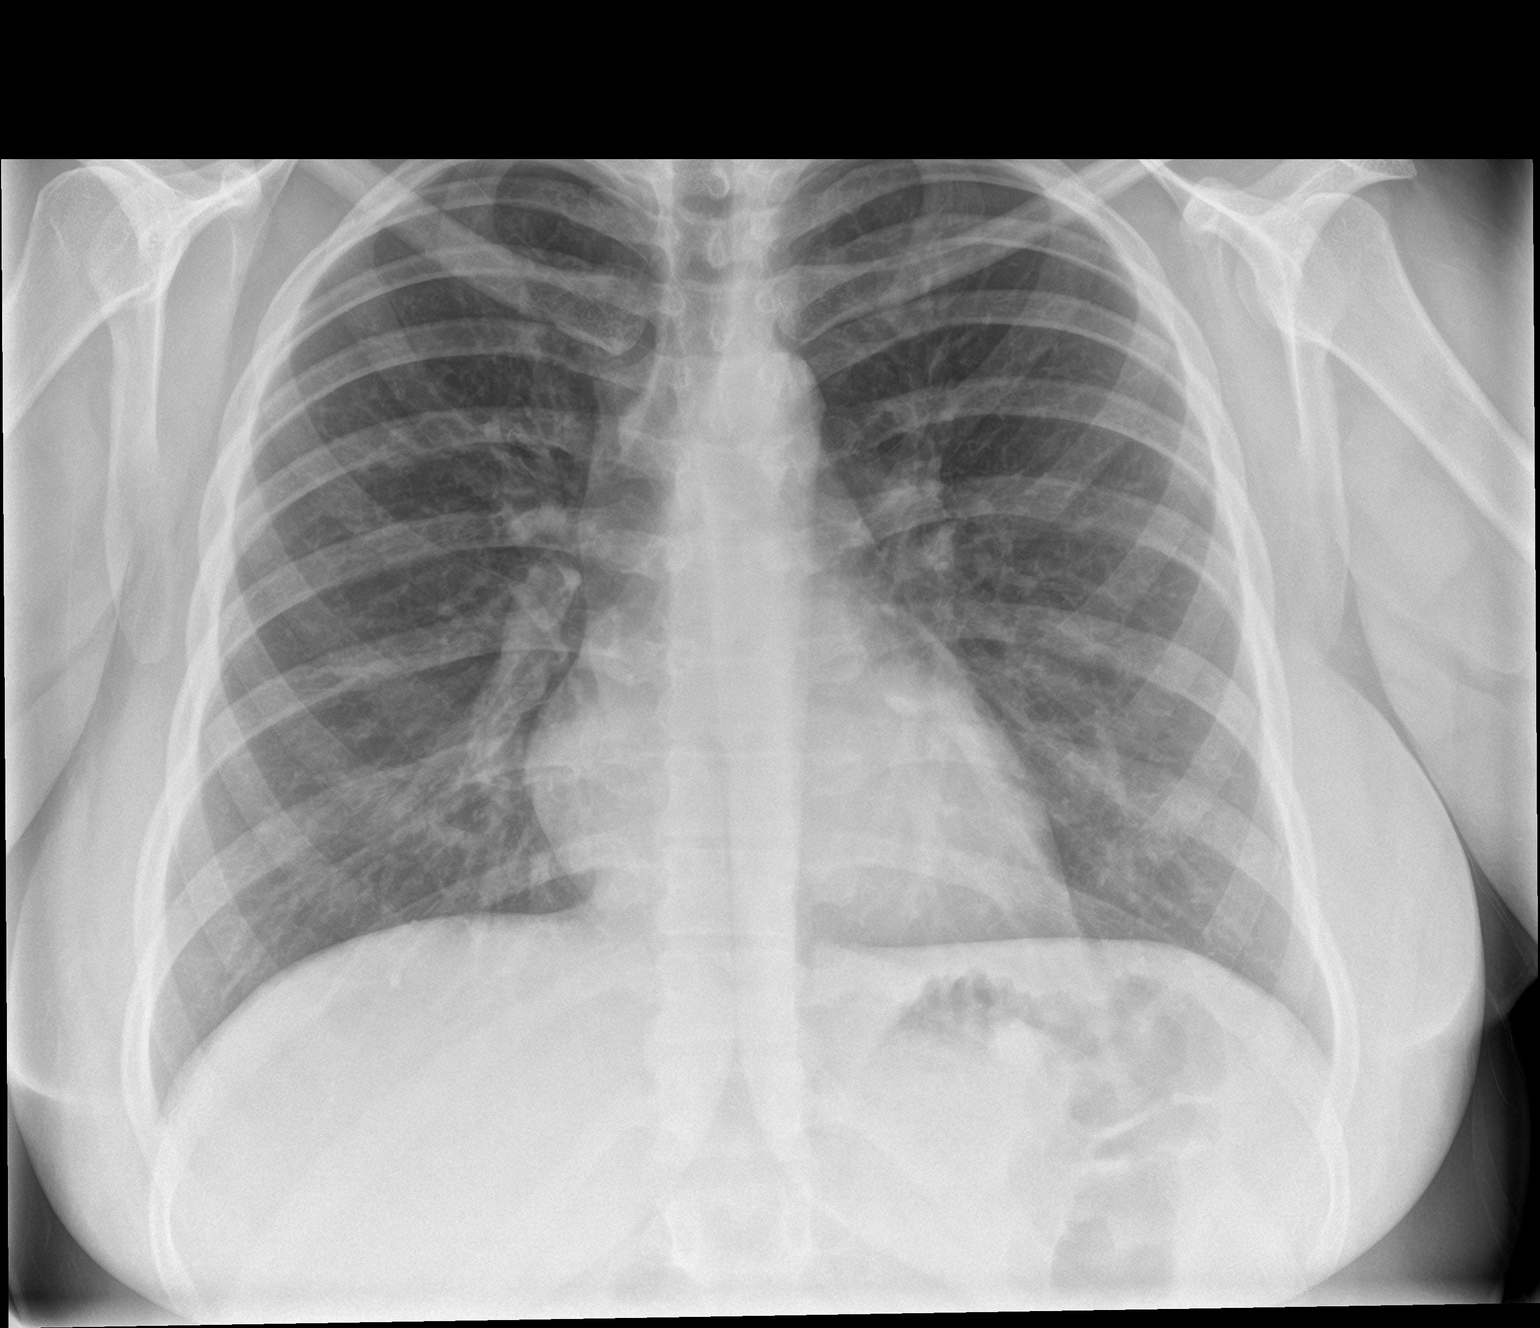

[chest lat]
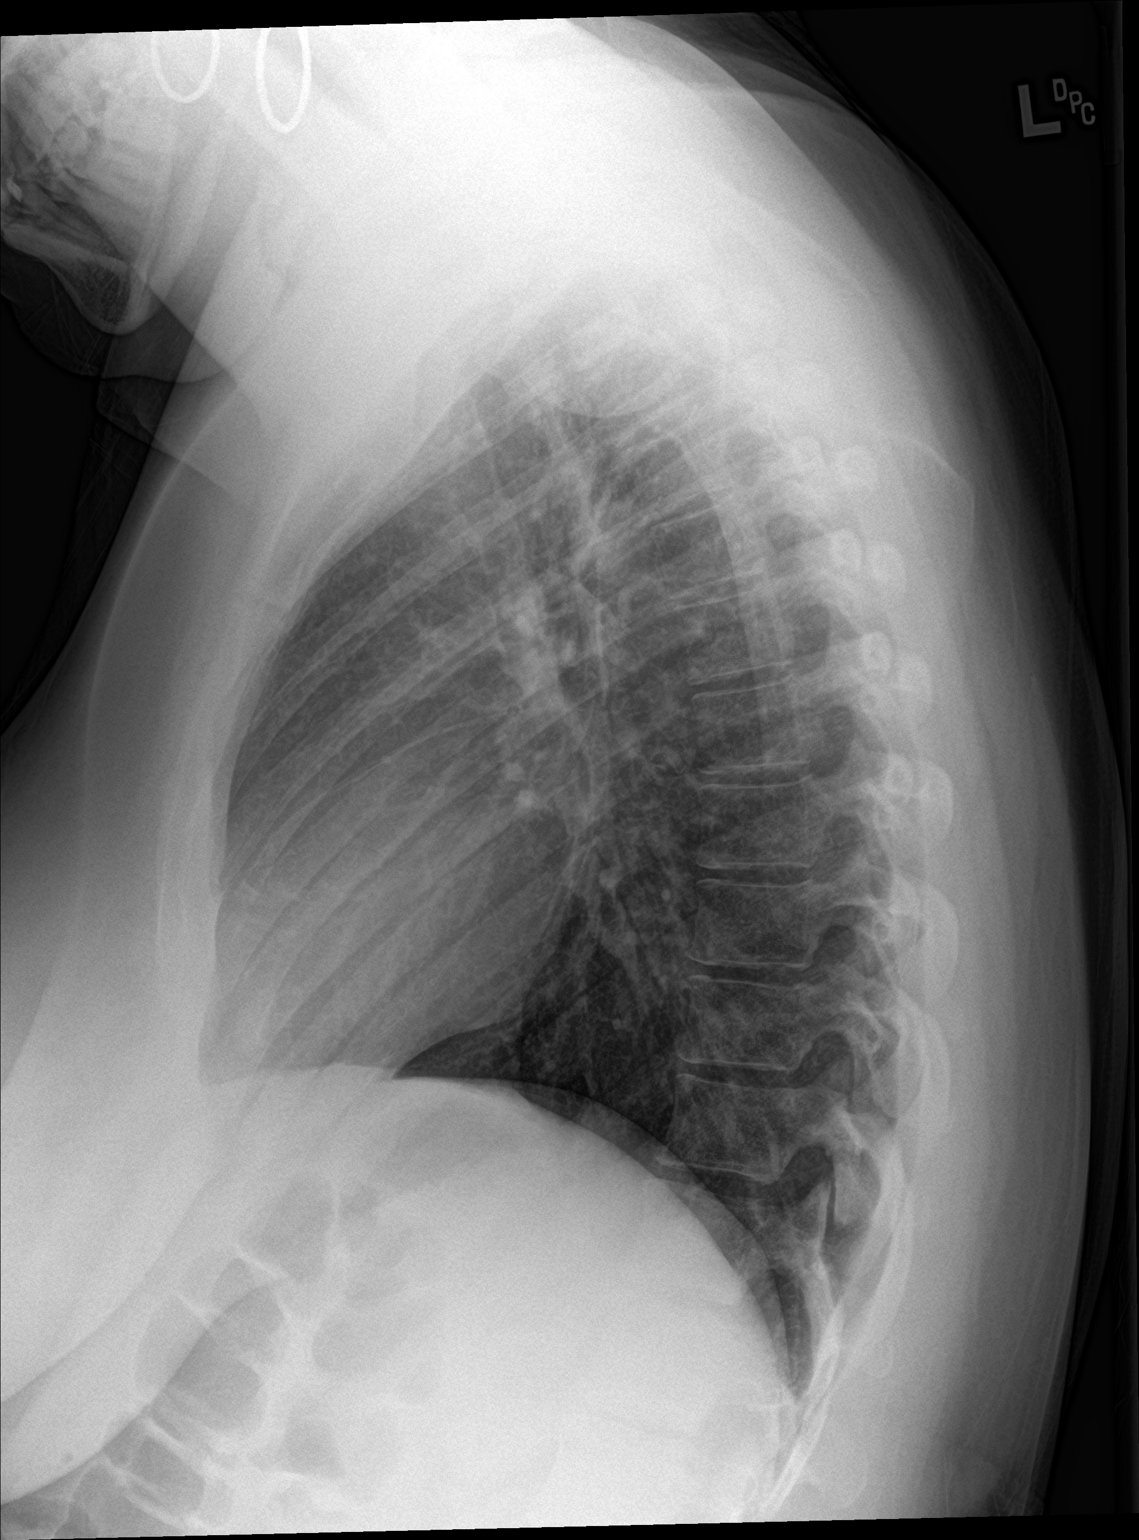

[2 of 2 positions shown; findings below may reference images not displayed]

FINDINGS: The heart size and mediastinal contours are within normal limits.
Both lungs are clear. The visualized skeletal structures are
unremarkable.
IMPRESSION: No active cardiopulmonary disease.

## 2019-06-04 NOTE — Progress Notes (Deleted)
Psychiatric Initial Adult Assessment   Patient Identification: Mary Hull MRN:  [id number] Date of Evaluation:  06/04/2019 Referral Source: *** Chief Complaint:   Visit Diagnosis: No diagnosis found.  History of Present Illness:   Mary Hull is a 24 y.o. year old female with a history of , iron deficiency anemia, who is referred for   Associated Signs/Symptoms: Depression Symptoms:  {DEPRESSION SYMPTOMS:20000} (Hypo) Manic Symptoms:  {BHH MANIC SYMPTOMS:22872} Anxiety Symptoms:  {BHH ANXIETY SYMPTOMS:22873} Psychotic Symptoms:  {BHH PSYCHOTIC SYMPTOMS:22874} PTSD Symptoms: {BHH PTSD SYMPTOMS:22875}  Past Psychiatric History:  Outpatient:  Psychiatry admission:  Previous suicide attempt:  Past trials of medication:  History of violence:   Previous Psychotropic Medications: {YES/NO:21197}  Substance Abuse History in the last 12 months:  {yes no:314532}  Consequences of Substance Abuse: {BHH CONSEQUENCES OF SUBSTANCE ABUSE:22880}  Past Medical History:  Past Medical History:  Diagnosis Date  . Asthma   . Gallbladder sludge   . Gestational diabetes    diet controlled  . Postpartum depression     Past Surgical History:  Procedure Laterality Date  . ADENOIDECTOMY    . COLONOSCOPY WITH PROPOFOL N/A 03/08/2019   Procedure: COLONOSCOPY WITH PROPOFOL;  Surgeon: Daneil Dolin, MD;  Entire examined colon is normal.  Distal TI appeared normal.  Repeat at age 26 for screening purposes.  Suspected IDA secondary to heavy menstrual cycles, possible element of iron malabsorption.  . ESOPHAGOGASTRODUODENOSCOPY (EGD) WITH PROPOFOL N/A 03/08/2019   Procedure: ESOPHAGOGASTRODUODENOSCOPY (EGD) WITH PROPOFOL;  Surgeon: Daneil Dolin, MD;  Mild erosive reflux esophagitis, normal stomach, normal examined duodenum.  . FRACTURE SURGERY Left    L elbow  . TONSILLECTOMY      Family Psychiatric History: ***  Family History:  Family History  Problem Relation Age of Onset   . Hypertension Maternal Grandmother   . Hypertension Maternal Aunt   . Colon cancer Neg Hx     Social History:   Social History   Socioeconomic History  . Marital status: Married    Spouse name: Not on file  . Number of children: 1  . Years of education: Not on file  . Highest education level: Not on file  Occupational History  . Not on file  Tobacco Use  . Smoking status: Current Some Day Smoker    Packs/day: 0.25    Years: 1.00    Pack years: 0.25    Types: Cigarettes  . Smokeless tobacco: Never Used  Substance and Sexual Activity  . Alcohol use: Yes    Comment: Occasionally  . Drug use: No  . Sexual activity: Yes    Birth control/protection: None  Other Topics Concern  . Not on file  Social History Narrative  . Not on file   Social Determinants of Health   Financial Resource Strain:   . Difficulty of Paying Living Expenses:   Food Insecurity:   . Worried About Charity fundraiser in the Last Year:   . Arboriculturist in the Last Year:   Transportation Needs:   . Film/video editor (Medical):   Marland Kitchen Lack of Transportation (Non-Medical):   Physical Activity:   . Days of Exercise per Week:   . Minutes of Exercise per Session:   Stress:   . Feeling of Stress :   Social Connections:   . Frequency of Communication with Friends and Family:   . Frequency of Social Gatherings with Friends and Family:   . Attends Religious Services:   .  Active Member of Clubs or Organizations:   . Attends Banker Meetings:   Marland Kitchen Marital Status:     Additional Social History: ***  Allergies:   Allergies  Allergen Reactions  . Atuss Da [Pse-Bromphen-Chlophedianol] Hives    Metabolic Disorder Labs: Lab Results  Component Value Date   HGBA1C 5.4 08/28/2017   No results found for: PROLACTIN No results found for: CHOL, TRIG, HDL, CHOLHDL, VLDL, LDLCALC Lab Results  Component Value Date   TSH 1.143 09/15/2016    Therapeutic Level Labs: No results found  for: LITHIUM No results found for: CBMZ No results found for: VALPROATE  Current Medications: Current Outpatient Medications  Medication Sig Dispense Refill  . albuterol (VENTOLIN HFA) 108 (90 Base) MCG/ACT inhaler Inhale 1-2 puffs into the lungs every 6 (six) hours as needed for wheezing or shortness of breath.     . ferrous sulfate 325 (65 FE) MG tablet Take 325 mg by mouth at bedtime.    . lidocaine (XYLOCAINE) 2 % solution USING A SYRINGE PLACE 34mL IN THE BACK OF YOUR THROAT AND SWALLOW AS NEEDED FOR UPPER ABDOMINAL PAIN UP TO 3 TIMES DAILY. (Patient taking differently: Use as directed 10 mLs in the mouth or throat 3 (three) times daily as needed (throat pain). ) 100 mL 0  . ondansetron (ZOFRAN ODT) 8 MG disintegrating tablet Take 1 tablet (8 mg total) by mouth every 8 (eight) hours as needed for nausea. 20 tablet 0  . pantoprazole (PROTONIX) 40 MG tablet Take 1 tablet (40 mg total) by mouth daily before breakfast. 30 tablet 3   No current facility-administered medications for this visit.    Musculoskeletal: Strength & Muscle Tone: N/A Gait & Station: N/A Patient leans: N/A  Psychiatric Specialty Exam: Review of Systems  not currently breastfeeding.There is no height or weight on file to calculate BMI.  General Appearance: {Appearance:22683}  Eye Contact:  {BHH EYE CONTACT:22684}  Speech:  Clear and Coherent  Volume:  Normal  Mood:  {BHH MOOD:22306}  Affect:  {Affect (PAA):22687}  Thought Process:  Coherent  Orientation:  Full (Time, Place, and Person)  Thought Content:  Logical  Suicidal Thoughts:  {ST/HT (PAA):22692}  Homicidal Thoughts:  {ST/HT (PAA):22692}  Memory:  Immediate;   Good  Judgement:  {Judgement (PAA):22694}  Insight:  {Insight (PAA):22695}  Psychomotor Activity:  Normal  Concentration:  Concentration: Good and Attention Span: Good  Recall:  Good  Fund of Knowledge:Good  Language: Good  Akathisia:  No  Handed:  Right  AIMS (if indicated):  not done   Assets:  Communication Skills Desire for Improvement  ADL's:  Intact  Cognition: WNL  Sleep:  {BHH GOOD/FAIR/POOR:22877}   Screenings: PHQ2-9     Nutrition from [facility]/2018 [facility] Initial [facility]renatal from 08/28/2017 in [facility] Office Visit from 11/13/2016 in [facility] Initial Prenatal from 01/17/2016 in [facility] Office Visit from 12/29/2015 in [facility]  PHQ-2 Total Score  0  0  4  0  0  PHQ-9 Total Score  -  0  9  5  -      Assessment and Plan:  Assessment  Plan  The patient demonstrates the following risk factors for suicide: Chronic risk factors for suicide include: {Chronic Risk Factors for PJKDTOI:71245809}. Acute risk factors for suicide include: {Acute Risk Factors for XIPJASN:05397673}. Protective factors for this patient include: {Protective Factors for Suicide ALPF:79024097}. Considering these factors, the overall suicide risk at this point  appears to be {Desc; low/moderate/high:110033}. Patient {ACTION; IS/IS OQH:47654650} appropriate for outpatient follow up.   Neysa Hotter, MD 5/7/202110:19 PM

## 2019-06-05 ENCOUNTER — Other Ambulatory Visit: Payer: Self-pay

## 2019-06-05 ENCOUNTER — Telehealth (HOSPITAL_COMMUNITY): Payer: Self-pay | Admitting: Psychiatry

## 2019-06-05 ENCOUNTER — Telehealth (HOSPITAL_COMMUNITY): Payer: Medicaid Other | Admitting: Psychiatry

## 2019-06-05 NOTE — Telephone Encounter (Signed)
Sent link for video visit through Epic. Patient did not sign in. Called the patient  for appointment scheduled today. The patient did not answer the phone. Left voice message to contact the office.  

## 2019-07-08 ENCOUNTER — Other Ambulatory Visit: Payer: Self-pay

## 2019-07-08 ENCOUNTER — Telehealth (INDEPENDENT_AMBULATORY_CARE_PROVIDER_SITE_OTHER): Payer: Medicaid Other | Admitting: Psychiatry

## 2019-07-08 ENCOUNTER — Encounter (HOSPITAL_COMMUNITY): Payer: Self-pay | Admitting: Psychiatry

## 2019-07-08 DIAGNOSIS — F330 Major depressive disorder, recurrent, mild: Secondary | ICD-10-CM

## 2019-07-08 NOTE — Patient Instructions (Signed)
1. Continue bupropion 150 mg daily - she will contact the office if she needs any refill 2.  Next appointment: 7/8 at 3:50

## 2019-07-08 NOTE — Progress Notes (Addendum)
Virtual Visit via Video Note  I connected with Mary Hull on 07/08/19 at 10:20 AM EDT by a video enabled telemedicine application and verified that I am speaking with the correct person using two identifiers.   I discussed the limitations of evaluation and management by telemedicine and the availability of in person appointments. The patient expressed understanding and agreed to proceed.     I discussed the assessment and treatment plan with the patient. The patient was provided an opportunity to ask questions and all were answered. The patient agreed with the plan and demonstrated an understanding of the instructions.   The patient was advised to call back or seek an in-person evaluation if the symptoms worsen or if the condition fails to improve as anticipated.  Location: patient- work, provider- office   I provided 40 minutes of non-face-to-face time during this encounter.   Neysa Hotter, MD      Psychiatric Initial Adult Assessment   Patient Identification: Mary Hull MRN:  [id number] Date of Evaluation:  07/08/2019 Referral Source: No ref. provider found Self Chief Complaint:  "I want to be better." Visit Diagnosis:    ICD-10-CM   1. MDD (major depressive disorder), recurrent episode, mild (HCC)  F33.0     History of Present Illness:   Mary Hull is a 24 y.o. year old female with a history of depression, anxiety, iron deficiency anemia, who is referred for depression.   She states that she has been suffering from depression since last year.  Her husband disclose his infidelity when she was 7 months pregnant of her youngest son.  They were separated for 8 months.  She felt traumatized by this. She did not see any sign and thought they were doing good.  This makes her feel scared as she is concerned that it might be happen again, although her husband gives her reassurance.  She feels that "it's me, I'm the problem." She wants to stay in the marriage as  he is good to her and her children. She believes that she has great relationship with him.  She takes good care of her children despite her mood.  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  Although she did miss a day from work due to depression, it has been more manageable since she was started on bupropion.   Depression- She feels exhausted. She has middle insomnia. She has crying spells, and feels irritable. Although she loves her children, she sometimes wants to be alone. She has passive SI a few weeks ago, which has improved since being started on bupropion.  She denies any SI/HI.   Substance- occasional drink, mixed drink, smokes marijuana occasionally to feel calmer  Other ROS as below.   Medication- Bupropion 150 mg daily for two weeks   Associated Signs/Symptoms:  Depression Symptoms:  depressed mood, insomnia, fatigue, anxiety, (Hypo) Manic Symptoms:  denies decreased need for sleep, euphoria Anxiety Symptoms:  Panic Symptoms, mild anxiety Psychotic Symptoms:  denies AH, VH PTSD Symptoms:  Negative  Past Psychiatric History:  Outpatient:   Managed by NP, post partum depression Psychiatry admission: denies  Previous suicide attempt: denies  Past trials of medication: sertraline, lexapro,  History of violence: denies   Previous Psychotropic Medications: Yes   Substance Abuse History in the last 12 months:  No.  Consequences of Substance Abuse: NA  Past Medical History:  Past Medical History:  Diagnosis Date  . Asthma   . Gallbladder  sludge   . Gestational diabetes    diet controlled  . Postpartum depression     Past Surgical History:  Procedure Laterality Date  . ADENOIDECTOMY    . COLONOSCOPY WITH PROPOFOL N/A 03/08/2019   Procedure: COLONOSCOPY WITH PROPOFOL;  Surgeon: Corbin Ade, MD;  Entire examined colon is normal.  Distal TI appeared normal.  Repeat at age 79 for screening purposes.  Suspected IDA secondary to  heavy menstrual cycles, possible element of iron malabsorption.  . ESOPHAGOGASTRODUODENOSCOPY (EGD) WITH PROPOFOL N/A 03/08/2019   Procedure: ESOPHAGOGASTRODUODENOSCOPY (EGD) WITH PROPOFOL;  Surgeon: Corbin Ade, MD;  Mild erosive reflux esophagitis, normal stomach, normal examined duodenum.  . FRACTURE SURGERY Left    L elbow  . TONSILLECTOMY      Family Psychiatric History:  As below  Family History:  Family History  Problem Relation Age of Onset  . Anxiety disorder Father   . Hypertension Maternal Grandmother   . Hypertension Maternal Aunt   . Colon cancer Neg Hx     Social History:   Social History   Socioeconomic History  . Marital status: Married    Spouse name: Not on file  . Number of children: 1  . Years of education: Not on file  . Highest education level: Not on file  Occupational History  . Not on file  Tobacco Use  . Smoking status: Current Some Day Smoker    Packs/day: 0.25    Years: 1.00    Pack years: 0.25    Types: Cigarettes  . Smokeless tobacco: Never Used  Vaping Use  . Vaping Use: Never used  Substance and Sexual Activity  . Alcohol use: Yes    Comment: Occasionally  . Drug use: No  . Sexual activity: Yes    Birth control/protection: None  Other Topics Concern  . Not on file  Social History Narrative  . Not on file   Social Determinants of Health   Financial Resource Strain:   . Difficulty of Paying Living Expenses:   Food Insecurity:   . Worried About Programme researcher, broadcasting/film/video in the Last Year:   . Barista in the Last Year:   Transportation Needs:   . Freight forwarder (Medical):   Marland Kitchen Lack of Transportation (Non-Medical):   Physical Activity:   . Days of Exercise per Week:   . Minutes of Exercise per Session:   Stress:   . Feeling of Stress :   Social Connections:   . Frequency of Communication with Friends and Family:   . Frequency of Social Gatherings with Friends and Family:   . Attends Religious Services:   .  Active Member of Clubs or Organizations:   . 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 Atuss Da [Pse-Bromphen-Chlophedianol] Hives    Metabolic Disorder Labs: Lab Results  Component Value Date   HGBA1C 5.4 08/28/2017   No results found for: PROLACTIN No results found for: CHOL, TRIG, HDL, CHOLHDL, VLDL, LDLCALC Lab Results  Component Value Date   TSH 1.143 09/15/2016    Therapeutic Level Labs: No results found for: LITHIUM No results found for: CBMZ No results found for:  VALPROATE  Current Medications: Current Outpatient Medications  Medication Sig Dispense Refill  . albuterol (VENTOLIN HFA) 108 (90 Base) MCG/ACT inhaler Inhale 1-2 puffs into the lungs every 6 (six) hours as needed for wheezing or shortness of breath.  (Patient not taking: Reported on 07/08/2019)    . ferrous sulfate 325 (65 FE) MG tablet Take 325 mg by mouth at bedtime. (Patient not taking: Reported on 07/08/2019)    . lidocaine (XYLOCAINE) 2 % solution USING A SYRINGE PLACE 58mL IN THE BACK OF YOUR THROAT AND SWALLOW AS NEEDED FOR UPPER ABDOMINAL PAIN UP TO 3 TIMES DAILY. (Patient not taking: Reported on 07/08/2019) 100 mL 0  . ondansetron (ZOFRAN ODT) 8 MG disintegrating tablet Take 1 tablet (8 mg total) by mouth every 8 (eight) hours as needed for nausea. (Patient not taking: Reported on 07/08/2019) 20 tablet 0  . pantoprazole (PROTONIX) 40 MG tablet Take 1 tablet (40 mg total) by mouth daily before breakfast. (Patient not taking: Reported on 07/08/2019) 30 tablet 3   No current facility-administered medications for this visit.    Musculoskeletal: Strength & Muscle Tone: N/A Gait &  Station: N/A Patient leans: N/A  Psychiatric Specialty Exam: Review of Systems  Psychiatric/Behavioral: Positive for dysphoric mood and sleep disturbance. Negative for agitation, behavioral problems, confusion, decreased concentration, hallucinations, self-injury and suicidal ideas. The patient is nervous/anxious. The patient is not hyperactive.   All other systems reviewed and are negative.   not currently breastfeeding.There is no height or weight on file to calculate BMI.  General Appearance: Fairly Groomed  Eye Contact:  Good  Speech:  Clear and Coherent  Volume:  Normal  Mood:  Anxious and Depressed  Affect:  Appropriate, Congruent and slightly down  Thought Process:  Coherent  Orientation:  Full (Time, Place, and Person)  Thought Content:  Logical  Suicidal Thoughts:  No  Homicidal Thoughts:  No  Memory:  Immediate;   Good  Judgement:  Good  Insight:  Fair  Psychomotor Activity:  Normal  Concentration:  Concentration: Good and Attention Span: Good  Recall:  Good  Fund of Knowledge:Good  Language: Good  Akathisia:  No  Handed:  Right  AIMS (if indicated):  not done  Assets:  Communication Skills Desire for Improvement  ADL's:  Intact  Cognition: WNL  Sleep:  Poor   Screenings: PHQ2-9     Nutrition from 02/25/2018 in [facility] Initial Prenatal from 08/28/2017 in [facility] Office Visit from 11/13/2016 in [facility] Initial Prenatal from 01/17/2016 in [facility] Office Visit from 12/29/2015 in [facility]  PHQ-2 Total Score 0 0 4 0 0  PHQ-9 Total Score -- 0 9 5 --      Assessment and Plan:  Mary Hull is a 24 y.o. year old female with a history of depression, anxiety, iron deficiency anemia, who is referred for depression.   1. MDD (major depressive disorder), recurrent episode, mild (HCC) She reports overall improvement in depressive symptoms after being started on bupropion.  Psychosocial  stressors includes marital conflict with her husband, who disclosed his infidelity while she was pregnant with her youngest son last year.  We will continue the current dose of medication at this time to see if he thinks there is full benefit for depression.  Will consider adding SSRI in the future if any worsening in anxiety.  She will greatly benefit from CBT; will make a referral.   # Marijuana use She is at  pre-contemplative stage for marijuana use for anxiety.  Will continue motivational interview.    Plan I have reviewed and updated plans as below 1. Continue bupropion 150 mg daily - she will contact the office if she needs any refill 2.  Next appointment: 7/8 at 3:50 for 30 mins, video 3. Referral to therapy - tsh was reportedly checked at PCP. She is advised to ask the result  The patient demonstrates the following risk factors for suicide: Chronic risk factors for suicide include: psychiatric disorder of depression, anxiety. Acute risk factors for suicide include: family or marital conflict. Protective factors for this patient include: positive social support. Considering these factors, the overall suicide risk at this point appears to be low. Patient is appropriate for outpatient follow up.   Norman Clay, MD 6/10/202111:13 AM

## 2019-07-12 ENCOUNTER — Other Ambulatory Visit: Payer: Self-pay

## 2019-07-12 ENCOUNTER — Emergency Department (HOSPITAL_COMMUNITY)
Admission: EM | Admit: 2019-07-12 | Discharge: 2019-07-13 | Disposition: A | Discharge status: Home / Self Care | Payer: Medicaid Other | Attending: Emergency Medicine | Admitting: Emergency Medicine

## 2019-07-12 DIAGNOSIS — J029 Acute pharyngitis, unspecified: Secondary | ICD-10-CM | POA: Diagnosis not present

## 2019-07-12 DIAGNOSIS — R05 Cough: Secondary | ICD-10-CM | POA: Insufficient documentation

## 2019-07-12 DIAGNOSIS — Z5321 Procedure and treatment not carried out due to patient leaving prior to being seen by health care provider: Secondary | ICD-10-CM | POA: Diagnosis not present

## 2019-07-12 DIAGNOSIS — Z20822 Contact with and (suspected) exposure to covid-19: Secondary | ICD-10-CM | POA: Insufficient documentation

## 2019-07-12 DIAGNOSIS — R0602 Shortness of breath: Secondary | ICD-10-CM | POA: Diagnosis present

## 2019-07-12 NOTE — ED Triage Notes (Signed)
Patient states COVID like symptoms. Patient is short of breath, has a sore throat, cough, congestion, and body aches that started today.

## 2019-07-13 ENCOUNTER — Emergency Department (HOSPITAL_COMMUNITY): Payer: Medicaid Other

## 2019-07-13 LAB — POC SARS CORONAVIRUS 2 AG -  ED: SARS Coronavirus 2 Ag: NEGATIVE

## 2019-07-13 NOTE — ED Notes (Signed)
Patient left without being seen by ed physician walked out

## 2019-07-28 NOTE — Progress Notes (Deleted)
BH MD/PA/NP OP Progress Note  07/28/2019 3:10 PM Mary Hull  MRN:  [id number]  Chief Complaint:  HPI: *** Visit Diagnosis: No diagnosis found.  Past Psychiatric History: Please see initial evaluation for full details. I have reviewed the history. No updates at this time.     Past Medical History:  Past Medical History:  Diagnosis Date  . Asthma   . Gallbladder sludge   . Gestational diabetes    diet controlled  . Postpartum depression     Past Surgical History:  Procedure Laterality Date  . ADENOIDECTOMY    . COLONOSCOPY WITH PROPOFOL N/A 03/08/2019   Procedure: COLONOSCOPY WITH PROPOFOL;  Surgeon: Corbin Ade, MD;  Entire examined colon is normal.  Distal TI appeared normal.  Repeat at age 86 for screening purposes.  Suspected IDA secondary to heavy menstrual cycles, possible element of iron malabsorption.  . ESOPHAGOGASTRODUODENOSCOPY (EGD) WITH PROPOFOL N/A 03/08/2019   Procedure: ESOPHAGOGASTRODUODENOSCOPY (EGD) WITH PROPOFOL;  Surgeon: Corbin Ade, MD;  Mild erosive reflux esophagitis, normal stomach, normal examined duodenum.  . FRACTURE SURGERY Left    L elbow  . TONSILLECTOMY      Family Psychiatric History: Please see initial evaluation for full details. I have reviewed the history. No updates at this time.     Family History:  Family History  Problem Relation Age of Onset  . Anxiety disorder Father   . Hypertension Maternal Grandmother   . Hypertension Maternal Aunt   . Colon cancer Neg Hx     Social History:  Social History   Socioeconomic History  . Marital status: Married    Spouse name: Not on file  . Number of children: 1  . Years of education: Not on file  . Highest education level: Not on file  Occupational History  . Not on file  Tobacco Use  . Smoking status: Current Some Day Smoker    Packs/day: 0.25    Years: 1.00    Pack years: 0.25    Types: Cigarettes  . Smokeless tobacco: Never Used  Vaping Use  . Vaping Use: Never  used  Substance and Sexual Activity  . Alcohol use: Yes    Comment: Occasionally  . Drug use: No  . Sexual activity: Yes    Birth control/protection: None  Other Topics Concern  . Not on file  Social History Narrative  . Not on file   Social Determinants of Health   Financial Resource Strain:   . Difficulty of Paying Living Expenses:   Food Insecurity:   . Worried About Programme researcher, broadcasting/film/video in the Last Year:   . Barista in the Last Year:   Transportation Needs:   . Freight forwarder (Medical):   Marland Kitchen Lack of Transportation (Non-Medical):   Physical Activity:   . Days of Exercise per Week:   . Minutes of Exercise per Session:   Stress:   . Feeling of Stress :   Social Connections:   . Frequency of Communication with Friends and Family:   . Frequency of Social Gatherings with Friends and Family:   . Attends Religious Services:   . Active Member of Clubs or Organizations:   . Attends Banker Meetings:   Marland Kitchen Marital Status:     Allergies:  Allergies  Allergen Reactions  . Atuss Da [Pse-Bromphen-Chlophedianol] Hives    Metabolic Disorder Labs: Lab Results  Component Value Date   HGBA1C 5.4 08/28/2017   No results found for: PROLACTIN  No results found for: CHOL, TRIG, HDL, CHOLHDL, VLDL, LDLCALC Lab Results  Component Value Date   TSH 1.143 09/15/2016    Therapeutic Level Labs: No results found for: LITHIUM No results found for: VALPROATE No components found for:  CBMZ  Current Medications: Current Outpatient Medications  Medication Sig Dispense Refill  . albuterol (VENTOLIN HFA) 108 (90 Base) MCG/ACT inhaler Inhale 1-2 puffs into the lungs every 6 (six) hours as needed for wheezing or shortness of breath.  (Patient not taking: Reported on 07/08/2019)    . ferrous sulfate 325 (65 FE) MG tablet Take 325 mg by mouth at bedtime. (Patient not taking: Reported on 07/08/2019)    . lidocaine (XYLOCAINE) 2 % solution USING A SYRINGE PLACE 50mL IN  THE BACK OF YOUR THROAT AND SWALLOW AS NEEDED FOR UPPER ABDOMINAL PAIN UP TO 3 TIMES DAILY. (Patient not taking: Reported on 07/08/2019) 100 mL 0  . ondansetron (ZOFRAN ODT) 8 MG disintegrating tablet Take 1 tablet (8 mg total) by mouth every 8 (eight) hours as needed for nausea. (Patient not taking: Reported on 07/08/2019) 20 tablet 0  . pantoprazole (PROTONIX) 40 MG tablet Take 1 tablet (40 mg total) by mouth daily before breakfast. (Patient not taking: Reported on 07/08/2019) 30 tablet 3   No current facility-administered medications for this visit.     Musculoskeletal: Strength & Muscle Tone: N/A Gait & Station: N/A Patient leans: N/A  Psychiatric Specialty Exam: Review of Systems  not currently breastfeeding.There is no height or weight on file to calculate BMI.  General Appearance: {Appearance:22683}  Eye Contact:  {BHH EYE CONTACT:22684}  Speech:  Clear and Coherent  Volume:  Normal  Mood:  {BHH MOOD:22306}  Affect:  {Affect (PAA):22687}  Thought Process:  Coherent  Orientation:  Full (Time, Place, and Person)  Thought Content: Logical   Suicidal Thoughts:  {ST/HT (PAA):22692}  Homicidal Thoughts:  {ST/HT (PAA):22692}  Memory:  Immediate;   Good  Judgement:  {Judgement (PAA):22694}  Insight:  {Insight (PAA):22695}  Psychomotor Activity:  Normal  Concentration:  Concentration: Good and Attention Span: Good  Recall:  Good  Fund of Knowledge: Good  Language: Good  Akathisia:  No  Handed:  Right  AIMS (if indicated): not done  Assets:  Communication Skills Desire for Improvement  ADL's:  Intact  Cognition: WNL  Sleep:  {BHH GOOD/FAIR/POOR:22877}   Screenings: PHQ2-9     Nutrition [facility]/2018 [facility]ren[facility] from 08/28/2017 in [facility] Office Visit from 11/13/2016 in [facility] Initial Prenatal from 01/17/2016 in [facility] Office Visit from 12/29/2015 in [facility]  PHQ-2 Total Score 0 0 4 0  0  PHQ-9 Total Score -- 0 9 5 --       Assessment and Plan:  Mary Hull is a 24 y.o. year old female with a history of depression, anxiety, iron deficiency anemia, who presents for follow up appointment for below.     1. MDD (major depressive disorder), recurrent episode, mild (HCC) She reports overall improvement in depressive symptoms after being started on bupropion.  Psychosocial stressors includes marital conflict with her husband, who disclosed his infidelity while she was pregnant with her youngest son last year.  We will continue the current dose of medication at this time to see if he thinks there is full benefit for depression.  Will consider adding SSRI in the future if any worsening in anxiety.  She will greatly benefit from CBT; will make  a referral.   # Marijuana use She is at pre-contemplative stage for marijuana use for anxiety.  Will continue motivational interview.    Plan  1. Continue bupropion 150 mg daily - she will contact the office if she needs any refill 2.  Next appointment: 7/8 at 3:50 for 30 mins, video 3. Referral to therapy - tsh was reportedly checked at PCP. She is advised to ask the result  The patient demonstrates the following risk factors for suicide: Chronic risk factors for suicide include: psychiatric disorder of depression, anxiety. Acute risk factors for suicide include: family or marital conflict. Protective factors for this patient include: positive social support. Considering these factors, the overall suicide risk at this point appears to be low. Patient is appropriate for outpatient follow up.  Neysa Hotter, MD 07/28/2019, 3:10 PM

## 2019-08-05 ENCOUNTER — Telehealth (HOSPITAL_COMMUNITY): Payer: Medicaid Other | Admitting: Psychiatry

## 2019-08-12 NOTE — Progress Notes (Deleted)
BH MD/PA/NP OP Progress Note  08/12/2019 9:36 AM Mary Hull  MRN:  [id number]  Chief Complaint:  HPI: *** Visit Diagnosis: No diagnosis found.  Past Psychiatric History: Please see initial evaluation for full details. I have reviewed the history. No updates at this time.     Past Medical History:  Past Medical History:  Diagnosis Date  . Asthma   . Gallbladder sludge   . Gestational diabetes    diet controlled  . Postpartum depression     Past Surgical History:  Procedure Laterality Date  . ADENOIDECTOMY    . COLONOSCOPY WITH PROPOFOL N/A 03/08/2019   Procedure: COLONOSCOPY WITH PROPOFOL;  Surgeon: Corbin Ade, MD;  Entire examined colon is normal.  Distal TI appeared normal.  Repeat at age 50 for screening purposes.  Suspected IDA secondary to heavy menstrual cycles, possible element of iron malabsorption.  . ESOPHAGOGASTRODUODENOSCOPY (EGD) WITH PROPOFOL N/A 03/08/2019   Procedure: ESOPHAGOGASTRODUODENOSCOPY (EGD) WITH PROPOFOL;  Surgeon: Corbin Ade, MD;  Mild erosive reflux esophagitis, normal stomach, normal examined duodenum.  . FRACTURE SURGERY Left    L elbow  . TONSILLECTOMY      Family Psychiatric History: Please see initial evaluation for full details. I have reviewed the history. No updates at this time.     Family History:  Family History  Problem Relation Age of Onset  . Anxiety disorder Father   . Hypertension Maternal Grandmother   . Hypertension Maternal Aunt   . Colon cancer Neg Hx     Social History:  Social History   Socioeconomic History  . Marital status: Married    Spouse name: Not on file  . Number of children: 1  . Years of education: Not on file  . Highest education level: Not on file  Occupational History  . Not on file  Tobacco Use  . Smoking status: Current Some Day Smoker    Packs/day: 0.25    Years: 1.00    Pack years: 0.25    Types: Cigarettes  . Smokeless tobacco: Never Used  Vaping Use  . Vaping Use: Never  used  Substance and Sexual Activity  . Alcohol use: Yes    Comment: Occasionally  . Drug use: No  . Sexual activity: Yes    Birth control/protection: None  Other Topics Concern  . Not on file  Social History Narrative  . Not on file   Social Determinants of Health   Financial Resource Strain:   . Difficulty of Paying Living Expenses:   Food Insecurity:   . Worried About Programme researcher, broadcasting/film/video in the Last Year:   . Barista in the Last Year:   Transportation Needs:   . Freight forwarder (Medical):   Marland Kitchen Lack of Transportation (Non-Medical):   Physical Activity:   . Days of Exercise per Week:   . Minutes of Exercise per Session:   Stress:   . Feeling of Stress :   Social Connections:   . Frequency of Communication with Friends and Family:   . Frequency of Social Gatherings with Friends and Family:   . Attends Religious Services:   . Active Member of Clubs or Organizations:   . Attends Banker Meetings:   Marland Kitchen Marital Status:     Allergies:  Allergies  Allergen Reactions  . Atuss Da [Pse-Bromphen-Chlophedianol] Hives    Metabolic Disorder Labs: Lab Results  Component Value Date   HGBA1C 5.4 08/28/2017   No results found for: PROLACTIN  No results found for: CHOL, TRIG, HDL, CHOLHDL, VLDL, LDLCALC Lab Results  Component Value Date   TSH 1.143 09/15/2016    Therapeutic Level Labs: No results found for: LITHIUM No results found for: VALPROATE No components found for:  CBMZ  Current Medications: Current Outpatient Medications  Medication Sig Dispense Refill  . albuterol (VENTOLIN HFA) 108 (90 Base) MCG/ACT inhaler Inhale 1-2 puffs into the lungs every 6 (six) hours as needed for wheezing or shortness of breath.  (Patient not taking: Reported on 07/08/2019)    . ferrous sulfate 325 (65 FE) MG tablet Take 325 mg by mouth at bedtime. (Patient not taking: Reported on 07/08/2019)    . lidocaine (XYLOCAINE) 2 % solution USING A SYRINGE PLACE 51mL IN  THE BACK OF YOUR THROAT AND SWALLOW AS NEEDED FOR UPPER ABDOMINAL PAIN UP TO 3 TIMES DAILY. (Patient not taking: Reported on 07/08/2019) 100 mL 0  . ondansetron (ZOFRAN ODT) 8 MG disintegrating tablet Take 1 tablet (8 mg total) by mouth every 8 (eight) hours as needed for nausea. (Patient not taking: Reported on 07/08/2019) 20 tablet 0  . pantoprazole (PROTONIX) 40 MG tablet Take 1 tablet (40 mg total) by mouth daily before breakfast. (Patient not taking: Reported on 07/08/2019) 30 tablet 3   No current facility-administered medications for this visit.     Musculoskeletal: Strength & Muscle Tone: N/A Gait & Station: N/A Patient leans: N/A  Psychiatric Specialty Exam: Review of Systems  not currently breastfeeding.There is no height or weight on file to calculate BMI.  General Appearance: {Appearance:22683}  Eye Contact:  {BHH EYE CONTACT:22684}  Speech:  Clear and Coherent  Volume:  Normal  Mood:  {BHH MOOD:22306}  Affect:  {Affect (PAA):22687}  Thought Process:  Coherent  Orientation:  Full (Time, Place, and Person)  Thought Content: Logical   Suicidal Thoughts:  {ST/HT (PAA):22692}  Homicidal Thoughts:  {ST/HT (PAA):22692}  Memory:  Immediate;   Good  Judgement:  {Judgement (PAA):22694}  Insight:  {Insight (PAA):22695}  Psychomotor Activity:  Normal  Concentration:  Concentration: Good and Attention Span: Good  Recall:  Good  Fund of Knowledge: Good  Language: Good  Akathisia:  No  Handed:  Right  AIMS (if indicated): not done  Assets:  Communication Skills Desire for Improvement  ADL's:  Intact  Cognition: WNL  Sleep:  {BHH GOOD/FAIR/POOR:22877}   Screenings: PHQ2-9     Nutrition from 02/25/2018 in [facility] Initial Prenatal from 08/28/2017 in [facility] Office Visit from 11/13/2016 in [facility] Initial Prenatal from 01/17/2016 in [facility] Office Visit from 12/29/2015 in [facility]  PHQ-2 Total Score 0 0 4 0  0  PHQ-9 Total Score -- 0 9 5 --       Assessment and Plan:  Mary Hull is a 24 y.o. year old female with a history of depression, anxiety, iron deficiency anemia, who presents for follow up appointment for below.    1. MDD (major depressive disorder), recurrent episode, mild (HCC) She reports overall improvement in depressive symptoms after being started on bupropion.  Psychosocial stressors includes marital conflict with her husband, who disclosed his infidelity while she was pregnant with her youngest son last year.  We will continue the current dose of medication at this time to see if he thinks there is full benefit for depression.  Will consider adding SSRI in the future if any worsening in anxiety.  She will greatly benefit from CBT; will make a  referral.   # Marijuana use She is at pre-contemplative stage for marijuana use for anxiety.  Will continue motivational interview.    Plan  1. Continue bupropion 150 mg daily - she will contact the office if she needs any refill 2.  Next appointment: 7/8 at 3:50 for 30 mins, video 3. Referral to therapy - tsh was reportedly checked at PCP. She is advised to ask the result  The patient demonstrates the following risk factors for suicide: Chronic risk factors for suicide include: psychiatric disorder of depression, anxiety. Acute risk factors for suicide include: family or marital conflict. Protective factors for this patient include: positive social support. Considering these factors, the overall suicide risk at this point appears to be low. Patient is appropriate for outpatient follow up.  Neysa Hotter, MD 08/12/2019, 9:36 AM

## 2019-08-17 ENCOUNTER — Telehealth (HOSPITAL_COMMUNITY): Payer: Self-pay | Admitting: Psychiatry

## 2019-08-17 ENCOUNTER — Other Ambulatory Visit: Payer: Self-pay

## 2019-08-17 ENCOUNTER — Telehealth (HOSPITAL_COMMUNITY): Payer: Medicaid Other | Admitting: Psychiatry

## 2019-08-17 NOTE — Telephone Encounter (Signed)
Sent link for video visit through Epic. Patient did not sign in. Called the patient  for appointment scheduled today. The patient did not answer the phone. Left voice message to contact the office.  

## 2019-09-16 ENCOUNTER — Telehealth (HOSPITAL_COMMUNITY): Payer: Medicaid Other | Admitting: Psychiatry

## 2019-09-20 ENCOUNTER — Other Ambulatory Visit: Payer: Self-pay

## 2019-09-20 ENCOUNTER — Encounter (HOSPITAL_COMMUNITY): Payer: Self-pay | Admitting: Emergency Medicine

## 2019-09-20 ENCOUNTER — Emergency Department (HOSPITAL_COMMUNITY): Payer: Medicaid Other

## 2019-09-20 ENCOUNTER — Emergency Department (HOSPITAL_COMMUNITY)
Admission: EM | Admit: 2019-09-20 | Discharge: 2019-09-21 | Disposition: A | Discharge status: Home / Self Care | Payer: Medicaid Other | Attending: Emergency Medicine | Admitting: Emergency Medicine

## 2019-09-20 DIAGNOSIS — R0602 Shortness of breath: Secondary | ICD-10-CM | POA: Diagnosis not present

## 2019-09-20 DIAGNOSIS — R0789 Other chest pain: Secondary | ICD-10-CM | POA: Insufficient documentation

## 2019-09-20 DIAGNOSIS — Z5321 Procedure and treatment not carried out due to patient leaving prior to being seen by health care provider: Secondary | ICD-10-CM | POA: Insufficient documentation

## 2019-09-20 DIAGNOSIS — J45909 Unspecified asthma, uncomplicated: Secondary | ICD-10-CM | POA: Diagnosis not present

## 2019-09-20 LAB — TROPONIN I (HIGH SENSITIVITY): Troponin I (High Sensitivity): 4 ng/L (ref <18)

## 2019-09-20 LAB — BASIC METABOLIC PANEL
Anion gap: 13 (ref 5–15)
BUN: <5 mg/dL — ABNORMAL LOW (ref 6–20)
CO2: 20 mmol/L — ABNORMAL LOW (ref 22–32)
Calcium: 9.2 mg/dL (ref 8.9–10.3)
Chloride: 104 mmol/L (ref 98–111)
Creatinine, Ser: 0.57 mg/dL (ref 0.44–1.00)
GFR calc Af Amer: >60 mL/min (ref >60)
GFR calc non Af Amer: >60 mL/min (ref >60)
Glucose, Bld: 100 mg/dL — ABNORMAL HIGH (ref 70–99)
Potassium: 3.4 mmol/L — ABNORMAL LOW (ref 3.5–5.1)
Sodium: 137 mmol/L (ref 135–145)

## 2019-09-20 LAB — CBC
HCT: 40.3 % (ref 36.0–46.0)
Hemoglobin: 12.7 g/dL (ref 12.0–15.0)
MCH: 25 pg — ABNORMAL LOW (ref 26.0–34.0)
MCHC: 31.5 g/dL (ref 30.0–36.0)
MCV: 79.5 fL — ABNORMAL LOW (ref 80.0–100.0)
Platelets: 302 10*3/uL (ref 150–400)
RBC: 5.07 MIL/uL (ref 3.87–5.11)
RDW: 15.7 % — ABNORMAL HIGH (ref 11.5–15.5)
WBC: 10.8 10*3/uL — ABNORMAL HIGH (ref 4.0–10.5)
nRBC: 0 % (ref 0.0–0.2)

## 2019-09-20 NOTE — ED Triage Notes (Addendum)
Pt presents to ED POV. Pt c/o CP and SOB. PT reports that s/s began 09/17/19. Pt reports that she has had negative COVID test x2. Pt reports SOB all the time. Pt tachycardic in triage. Pt hx asthma, attempted to do home inhalers, no relief

## 2019-09-21 LAB — I-STAT BETA HCG BLOOD, ED (MC, WL, AP ONLY): I-stat hCG, quantitative: <5 m[IU]/mL (ref <5)

## 2019-09-21 LAB — TROPONIN I (HIGH SENSITIVITY): Troponin I (High Sensitivity): 4 ng/L (ref <18)

## 2019-09-21 NOTE — ED Notes (Signed)
Pt states she will look at her mychart and check in with her PCP, she feels better

## 2021-02-16 ENCOUNTER — Telehealth: Payer: Medicaid Other | Admitting: Physician Assistant

## 2021-02-16 DIAGNOSIS — J020 Streptococcal pharyngitis: Secondary | ICD-10-CM | POA: Diagnosis not present

## 2021-02-16 MED ORDER — ONDANSETRON HCL 4 MG PO TABS: 4.0000 mg | ORAL_TABLET | Freq: Three times a day (TID) | ORAL | 0 refills | Status: DC | PRN

## 2021-02-16 MED ORDER — BENZONATATE 100 MG PO CAPS: 100.0000 mg | ORAL_CAPSULE | Freq: Three times a day (TID) | ORAL | 0 refills | Status: DC | PRN

## 2021-02-16 MED ORDER — AMOXICILLIN 500 MG PO CAPS: 500.0000 mg | ORAL_CAPSULE | Freq: Two times a day (BID) | ORAL | 0 refills | Status: AC

## 2021-02-16 NOTE — Progress Notes (Signed)
Virtual Visit Consent   Mary Hull, you are scheduled for a virtual visit with a [place] provider today.     Just as with appointments in the office, your consent must be obtained to participate.  Your consent will be active for this visit and any virtual visit you may have with one of our providers in the next 365 days.     If you have a MyChart account, a copy of this consent can be sent to you electronically.  All virtual visits are billed to your insurance company just like a traditional visit in the office.    As this is a virtual visit, video technology does not allow for your provider to perform a traditional examination.  This may limit your provider's ability to fully assess your condition.  If your provider identifies any concerns that need to be evaluated in person or the need to arrange testing (such as labs, EKG, etc.), we will make arrangements to do so.     Although advances in technology are sophisticated, we cannot ensure that it will always work on either your end or our end.  If the connection with a video visit is poor, the visit may have to be switched to a telephone visit.  With either a video or telephone visit, we are not always able to ensure that we have a secure connection.     I need to obtain your verbal consent now.   Are you willing to proceed with your visit today?    Mary Hull has provided verbal consent on 02/16/2021 for a virtual visit (video or telephone).   Mar Daring, PA-C   Date: 02/16/2021 8:46 AM   Virtual Visit via Video Note   I, Mar Daring, connected with  Mary Hull  ([id number], [birth year]) on 02/16/21 at  8:45 AM EST by a video-enabled telemedicine application and verified that I am speaking with the correct person using two identifiers.  Location: Patient: Virtual Visit Location Patient: Home Provider: Virtual Visit Location Provider: Home Office   I discussed the limitations of evaluation and  management by telemedicine and the availability of in person appointments. The patient expressed understanding and agreed to proceed.    History of Present Illness: Mary Hull is a 26 y.o. who identifies as a female who was assigned female at birth, and is being seen today for fever, chills, coughing and vomiting.  HPI: Fever  This is a new problem. The current episode started yesterday. The problem occurs intermittently. The problem has been gradually worsening. Her temperature was unmeasured prior to arrival. Associated symptoms include congestion, coughing, headaches, muscle aches, nausea, a sore throat and vomiting. Pertinent negatives include no ear pain. Associated symptoms comments: Chills, mild rhinorrhea. She has tried fluids and NSAIDs (nyquil, dayquil) for the symptoms. The treatment provided no relief.  Risk factors: sick contacts (son tested positive for strep throat yesterday)      Problems:  Patient Active Problem List   Diagnosis Date Noted   Abdominal pain 02/17/2019   Nausea without vomiting 02/17/2019   Microcytic anemia 02/17/2019   Elevated liver enzymes 03/18/2018   Gallbladder sludge    History of gestational diabetes 06/20/2016   Abnormal findings on diagnostic imaging of gall bladder 03/27/2016    Allergies:  Allergies  Allergen Reactions   Atuss Da [Pse-Bromphen-Chlophedianol] Hives   Medications:  Current Outpatient Medications:    amoxicillin (AMOXIL) 500 MG capsule, Take 1 capsule (500 mg total) by  mouth 2 (two) times daily for 10 days., Disp: 20 capsule, Rfl: 0   benzonatate (TESSALON) 100 MG capsule, Take 1 capsule (100 mg total) by mouth 3 (three) times daily as needed., Disp: 30 capsule, Rfl: 0   ondansetron (ZOFRAN) 4 MG tablet, Take 1 tablet (4 mg total) by mouth every 8 (eight) hours as needed for nausea or vomiting., Disp: 20 tablet, Rfl: 0   albuterol (VENTOLIN HFA) 108 (90 Base) MCG/ACT inhaler, Inhale 1-2 puffs into the lungs every 6  (six) hours as needed for wheezing or shortness of breath.  (Patient not taking: Reported on 07/08/2019), Disp: , Rfl:    ferrous sulfate 325 (65 FE) MG tablet, Take 325 mg by mouth at bedtime. (Patient not taking: Reported on 07/08/2019), Disp: , Rfl:    lidocaine (XYLOCAINE) 2 % solution, USING A SYRINGE PLACE 32mL IN THE BACK OF YOUR THROAT AND SWALLOW AS NEEDED FOR UPPER ABDOMINAL PAIN UP TO 3 TIMES DAILY. (Patient not taking: Reported on 07/08/2019), Disp: 100 mL, Rfl: 0   ondansetron (ZOFRAN ODT) 8 MG disintegrating tablet, Take 1 tablet (8 mg total) by mouth every 8 (eight) hours as needed for nausea. (Patient not taking: Reported on 07/08/2019), Disp: 20 tablet, Rfl: 0   pantoprazole (PROTONIX) 40 MG tablet, Take 1 tablet (40 mg total) by mouth daily before breakfast. (Patient not taking: Reported on 07/08/2019), Disp: 30 tablet, Rfl: 3  Observations/Objective: Patient is well-developed, well-nourished in no acute distress.  Patient appears ill Resting comfortably at home.  Head is normocephalic, atraumatic.  No labored breathing.  Speech is clear and coherent with logical content.  Patient is alert and oriented at baseline.    Assessment and Plan: 1. Strep throat - amoxicillin (AMOXIL) 500 MG capsule; Take 1 capsule (500 mg total) by mouth 2 (two) times daily for 10 days.  Dispense: 20 capsule; Refill: 0 - ondansetron (ZOFRAN) 4 MG tablet; Take 1 tablet (4 mg total) by mouth every 8 (eight) hours as needed for nausea or vomiting.  Dispense: 20 tablet; Refill: 0 - benzonatate (TESSALON) 100 MG capsule; Take 1 capsule (100 mg total) by mouth 3 (three) times daily as needed.  Dispense: 30 capsule; Refill: 0  - Suspect strep due to exposure - Will prescribe amoxicillin, tessalon, and zofran - Salt water gargles and warm liquids - Tylenol and Ibuprofen can be alternated as needed - Seek in person evaluation if not improving or worsening  Follow Up Instructions: I discussed the assessment  and treatment plan with the patient. The patient was provided an opportunity to ask questions and all were answered. The patient agreed with the plan and demonstrated an understanding of the instructions.  A copy of instructions were sent to the patient via MyChart unless otherwise noted below.    The patient was advised to call back or seek an in-person evaluation if the symptoms worsen or if the condition fails to improve as anticipated.  Time:  I spent 11 minutes with the patient via telehealth technology discussing the above problems/concerns.    Mar Daring, PA-C

## 2021-02-16 NOTE — Patient Instructions (Signed)
Mary Hull, thank you for joining Mar Daring, PA-C for today's virtual visit.  While this provider is not your primary care provider (PCP), if your PCP is located in our provider database this encounter information will be shared with them immediately following your visit.  Consent: (Patient) Mary Hull provided verbal consent for this virtual visit at the beginning of the encounter.  Current Medications:  Current Outpatient Medications:    amoxicillin (AMOXIL) 500 MG capsule, Take 1 capsule (500 mg total) by mouth 2 (two) times daily for 10 days., Disp: 20 capsule, Rfl: 0   benzonatate (TESSALON) 100 MG capsule, Take 1 capsule (100 mg total) by mouth 3 (three) times daily as needed., Disp: 30 capsule, Rfl: 0   ondansetron (ZOFRAN) 4 MG tablet, Take 1 tablet (4 mg total) by mouth every 8 (eight) hours as needed for nausea or vomiting., Disp: 20 tablet, Rfl: 0   albuterol (VENTOLIN HFA) 108 (90 Base) MCG/ACT inhaler, Inhale 1-2 puffs into the lungs every 6 (six) hours as needed for wheezing or shortness of breath.  (Patient not taking: Reported on 07/08/2019), Disp: , Rfl:    ferrous sulfate 325 (65 FE) MG tablet, Take 325 mg by mouth at bedtime. (Patient not taking: Reported on 07/08/2019), Disp: , Rfl:    lidocaine (XYLOCAINE) 2 % solution, USING A SYRINGE PLACE 28mL IN THE BACK OF YOUR THROAT AND SWALLOW AS NEEDED FOR UPPER ABDOMINAL PAIN UP TO 3 TIMES DAILY. (Patient not taking: Reported on 07/08/2019), Disp: 100 mL, Rfl: 0   ondansetron (ZOFRAN ODT) 8 MG disintegrating tablet, Take 1 tablet (8 mg total) by mouth every 8 (eight) hours as needed for nausea. (Patient not taking: Reported on 07/08/2019), Disp: 20 tablet, Rfl: 0   pantoprazole (PROTONIX) 40 MG tablet, Take 1 tablet (40 mg total) by mouth daily before breakfast. (Patient not taking: Reported on 07/08/2019), Disp: 30 tablet, Rfl: 3   Medications ordered in this encounter:  Meds ordered this encounter   Medications   amoxicillin (AMOXIL) 500 MG capsule    Sig: Take 1 capsule (500 mg total) by mouth 2 (two) times daily for 10 days.    Dispense:  20 capsule    Refill:  0    Order Specific Question:   Supervising Provider    Answer:   MILLER, BRIAN [[id number]]   ondansetron (ZOFRAN) 4 MG tablet    Sig: Take 1 tablet (4 mg total) by mouth every 8 (eight) hours as needed for nausea or vomiting.    Dispense:  20 tablet    Refill:  0    Order Specific Question:   Supervising Provider    Answer:   MILLER, BRIAN [[id number]]   benzonatate (TESSALON) 100 MG capsule    Sig: Take 1 capsule (100 mg total) by mouth 3 (three) times daily as needed.    Dispense:  30 capsule    Refill:  0    Order Specific Question:   Supervising Provider    Answer:   Sabra Heck, [place]     *If you need refills on other medications prior to your next appointment, please contact your pharmacy*  Follow-Up: Call back or seek an in-person evaluation if the symptoms worsen or if the condition fails to improve as anticipated.  Other Instructions Strep Throat, Adult Strep throat is an infection in the throat that is caused by bacteria. It is common during the cold months of the year. It mostly affects children who are 36-60 years old.  However, people of all ages can get it at any time of the year. This infection spreads from person to person (is contagious) through coughing, sneezing, or having close contact. Your health care provider may use other names to describe the infection. When strep throat affects the tonsils, it is called tonsillitis. When it affects the back of the throat, it is called pharyngitis. What are the causes? This condition is caused by the Streptococcus pyogenes bacteria. What increases the risk? You are more likely to develop this condition if: You care for school-age children, or are around school-age children. Children are more likely to get strep throat and may spread it to others. You spend time in  crowded places where the infection can spread easily. You have close contact with someone who has strep throat. What are the signs or symptoms? Symptoms of this condition include: Fever or chills. Redness, swelling, or pain in the tonsils or throat. Pain or difficulty when swallowing. White or yellow spots on the tonsils or throat. Tender glands in the neck and under the jaw. Bad smelling breath. Red rash all over the body. This is rare. How is this diagnosed? This condition is diagnosed by tests that check for the presence and the amount of bacteria that cause strep throat. They are: Rapid strep test. Your throat is swabbed and checked for the presence of bacteria. Results are usually ready in minutes. Throat culture test. Your throat is swabbed. The sample is placed in a cup that allows infections to grow. Results are usually ready in 1 or 2 days. How is this treated? This condition may be treated with: Medicines that kill germs (antibiotics). Medicines that relieve pain or fever. These include: Ibuprofen or acetaminophen. Aspirin, only for people who are over the age of 7. Throat lozenges. Throat sprays. Follow these instructions at home: Medicines  Take over-the-counter and prescription medicines only as told by your health care provider. Take your antibiotic medicine as told by your health care provider. Do not stop taking the antibiotic even if you start to feel better. Eating and drinking  If you have trouble swallowing, try eating soft foods until your sore throat feels better. Drink enough fluid to keep your urine pale yellow. To help relieve pain, you may have: Warm fluids, such as soup and tea. Cold fluids, such as frozen desserts or popsicles. General instructions Gargle with a salt-water mixture 3-4 times a day or as needed. To make a salt-water mixture, completely dissolve -1 tsp (3-6 g) of salt in 1 cup (237 mL) of warm water. Get plenty of rest. Stay home from  work or school until you have been taking antibiotics for 24 hours. Do not use any products that contain nicotine or tobacco. These products include cigarettes, chewing tobacco, and vaping devices, such as e-cigarettes. If you need help quitting, ask your health care provider. It is up to you to get your test results. Ask your health care provider, or the department that is doing the test, when your results will be ready. Keep all follow-up visits. This is important. How is this prevented?  Do not share food, drinking cups, or personal items that could cause the infection to spread to other people. Wash your hands often with soap and water for at least 20 seconds. If soap and water are not available, use hand sanitizer. Make sure that all people in your house wash their hands well. Have family members tested if they have a sore throat or fever. They may  need an antibiotic if they have strep throat. Contact a health care provider if: You have swelling in your neck that keeps getting bigger. You develop a rash, cough, or earache. You cough up a thick mucus that is green, yellow-brown, or bloody. You have pain or discomfort that does not get better with medicine. Your symptoms seem to be getting worse. You have a fever. Get help right away if: You have new symptoms, such as vomiting, severe headache, stiff or painful neck, chest pain, or shortness of breath. You have severe throat pain, drooling, or changes in your voice. You have swelling of the neck, or the skin on the neck becomes red and tender. You have signs of dehydration, such as tiredness (fatigue), dry mouth, and decreased urination. You become increasingly sleepy, or you cannot wake up completely. Your joints become red or painful. These symptoms may represent a serious problem that is an emergency. Do not wait to see if the symptoms will go away. Get medical help right away. Call your local emergency services (911 in the [place] Do not  drive yourself to the hospital. Summary Strep throat is an infection in the throat that is caused by the Streptococcus pyogenes bacteria. This infection is spread from person to person (is contagious) through coughing, sneezing, or having close contact. Take your medicines, including antibiotics, as told by your health care provider. Do not stop taking the antibiotic even if you start to feel better. To prevent the spread of germs, wash your hands well with soap and water. Have others do the same. Do not share food, drinking cups, or personal items. Get help right away if you have new symptoms, such as vomiting, severe headache, stiff or painful neck, chest pain, or shortness of breath. This information is not intended to replace advice given to you by your health care provider. Make sure you discuss any questions you have with your health care provider. Document Revised: 05/09/2020 Document Reviewed: 05/09/2020 Elsevier Patient Education  2022 [employer].    If you have been instructed to have an in-person evaluation today at a local Urgent Care facility, please use the link below. It will take you to a list of all of our available [facility], including address, phone number and hours of operation. Please do not delay care.  [facility]  If you or a family member do not have a primary care provider, use the link below to schedule a visit and establish care. When you choose a [facility] primary care physician or advanced practice provider, you gain a long-term partner in health. Find a Primary Care Provider  Learn more about [place]'s in-office and virtual care options: [facility]

## 2021-02-19 ENCOUNTER — Other Ambulatory Visit: Payer: Self-pay

## 2021-02-19 ENCOUNTER — Encounter: Payer: Self-pay | Admitting: Emergency Medicine

## 2021-02-19 ENCOUNTER — Ambulatory Visit
Admission: EM | Admit: 2021-02-19 | Discharge: 2021-02-19 | Disposition: A | Discharge status: Home / Self Care | Payer: Medicaid Other | Attending: Family Medicine | Admitting: Family Medicine

## 2021-02-19 DIAGNOSIS — R062 Wheezing: Secondary | ICD-10-CM

## 2021-02-19 DIAGNOSIS — J069 Acute upper respiratory infection, unspecified: Secondary | ICD-10-CM

## 2021-02-19 DIAGNOSIS — Z1152 Encounter for screening for COVID-19: Secondary | ICD-10-CM

## 2021-02-19 DIAGNOSIS — J4541 Moderate persistent asthma with (acute) exacerbation: Secondary | ICD-10-CM | POA: Diagnosis not present

## 2021-02-19 DIAGNOSIS — R0602 Shortness of breath: Secondary | ICD-10-CM | POA: Diagnosis not present

## 2021-02-19 MED ORDER — PROMETHAZINE-DM 6.25-15 MG/5ML PO SYRP: 5.0000 mL | ORAL_SOLUTION | Freq: Four times a day (QID) | ORAL | 0 refills | Status: DC | PRN

## 2021-02-19 MED ORDER — BUDESONIDE-FORMOTEROL FUMARATE 160-4.5 MCG/ACT IN AERO: 2.0000 | INHALATION_SPRAY | Freq: Two times a day (BID) | RESPIRATORY_TRACT | 0 refills | Status: DC

## 2021-02-19 MED ORDER — PREDNISONE 20 MG PO TABS: 40.0000 mg | ORAL_TABLET | Freq: Every day | ORAL | 0 refills | Status: DC

## 2021-02-19 MED ORDER — DEXAMETHASONE SODIUM PHOSPHATE 10 MG/ML IJ SOLN
10.0000 mg | Freq: Once | INTRAMUSCULAR | Status: AC
  Administered 2021-02-19: 10 mg via INTRAMUSCULAR

## 2021-02-19 NOTE — ED Provider Notes (Signed)
[facility]    CSN: [id number] Arrival date & time: 02/19/21  1020      History   Chief Complaint Chief Complaint  Patient presents with   Fever   Shortness of Breath   Generalized Body Aches    HPI Mary Hull is a 26 y.o. female.   Presenting today with 4-day history of fever, shortness of breath, body aches, chills, productive cough of green sputum, fatigue.  Denies chest pain, abdominal pain, nausea vomiting or diarrhea, sore throat.  Did a virtual visit 3 days ago and was treated with amoxicillin, Zofran, Tessalon Perles for suspected a strep as her 89-year-old is currently sick with strep but states that she has not improved on 3 days of amoxicillin and has not had a sore throat throughout duration of symptoms.  She does have a history of asthma on albuterol inhaler and nebulizer which was helping mildly initially but is now not helping.  Has been trying the [facility] also with minimal relief.   Past Medical History:  Diagnosis Date   Asthma    Gallbladder sludge    Gestational diabetes    diet controlled   Postpartum depression     Patient Active Problem List   Diagnosis Date Noted   Abdominal pain 02/17/2019   Nausea without vomiting 02/17/2019   Microcytic anemia 02/17/2019   Elevated liver enzymes 03/18/2018   Gallbladder sludge    History of gestational diabetes 06/20/2016   Abnormal findings on diagnostic imaging of gall bladder 03/27/2016    Past Surgical History:  Procedure Laterality Date   ADENOIDECTOMY     COLONOSCOPY WITH PROPOFOL N/A 03/08/2019   Procedure: COLONOSCOPY WITH PROPOFOL;  Surgeon: Daneil Dolin, MD;  Entire examined colon is normal.  Distal TI appeared normal.  Repeat at age 30 for screening purposes.  Suspected IDA secondary to heavy menstrual cycles, possible element of iron malabsorption.   ESOPHAGOGASTRODUODENOSCOPY (EGD) WITH PROPOFOL N/A 03/08/2019   Procedure: ESOPHAGOGASTRODUODENOSCOPY (EGD) WITH  PROPOFOL;  Surgeon: Daneil Dolin, MD;  Mild erosive reflux esophagitis, normal stomach, normal examined duodenum.   FRACTURE SURGERY Left    L elbow   TONSILLECTOMY      OB History     Gravida  2   Para  2   Term  2   Preterm      AB      Living  2      SAB      IAB      Ectopic      Multiple  0   Live Births  2            Home Medications    Prior to Admission medications   Medication Sig Start Date End Date Taking? Authorizing Provider  albuterol (VENTOLIN HFA) 108 (90 Base) MCG/ACT inhaler Inhale 1-2 puffs into the lungs every 6 (six) hours as needed for wheezing or shortness of breath.   Yes [provider]  amoxicillin (AMOXIL) 500 MG capsule Take 1 capsule (500 mg total) by mouth 2 (two) times daily for 10 days. 02/16/21 02/26/21 Yes Burnette, Clearnce Sorrel, PA-C  benzonatate (TESSALON) 100 MG capsule Take 1 capsule (100 mg total) by mouth 3 (three) times daily as needed. 02/16/21  Yes Mar Daring, PA-C  budesonide-formoterol (SYMBICORT) 160-4.5 MCG/ACT inhaler Inhale 2 puffs into the lungs 2 (two) times daily. Rinse mouth with water after each use 02/19/21  Yes Volney American, PA-C  ondansetron [employer]) 4 MG  tablet Take 1 tablet (4 mg total) by mouth every 8 (eight) hours as needed for nausea or vomiting. 02/16/21  Yes Mar Daring, PA-C  predniSONE (DELTASONE) 20 MG tablet Take 2 tablets (40 mg total) by mouth daily with breakfast. 02/19/21  Yes Volney American, PA-C  promethazine-dextromethorphan (PROMETHAZINE-DM) 6.25-15 MG/5ML syrup Take 5 mLs by mouth 4 (four) times daily as needed. 02/19/21  Yes Volney American, PA-C  ferrous sulfate 325 (65 FE) MG tablet Take 325 mg by mouth at bedtime. Patient not taking: Reported on 07/08/2019    [provider]  lidocaine (XYLOCAINE) 2 % solution USING A SYRINGE PLACE 26mL IN THE BACK OF YOUR THROAT AND SWALLOW AS NEEDED FOR UPPER ABDOMINAL PAIN UP TO 3 TIMES  DAILY. Patient not taking: Reported on 07/08/2019 03/04/19   Erenest Rasher, PA-C  ondansetron (ZOFRAN ODT) 8 MG disintegrating tablet Take 1 tablet (8 mg total) by mouth every 8 (eight) hours as needed for nausea. Patient not taking: Reported on 07/08/2019 03/01/19   Varney Biles, MD  pantoprazole (PROTONIX) 40 MG tablet Take 1 tablet (40 mg total) by mouth daily before breakfast. Patient not taking: Reported on 07/08/2019 02/17/19   Erenest Rasher, PA-C    Family History Family History  Problem Relation Age of Onset   Anxiety disorder Father    Hypertension Maternal Grandmother    Hypertension Maternal Aunt    Colon cancer Neg Hx     Social History Social History   Tobacco Use   Smoking status: Some Days    Packs/day: 0.25    Years: 1.00    Pack years: 0.25    Types: Cigarettes   Smokeless tobacco: Never  Vaping Use   Vaping Use: Never used  Substance Use Topics   Alcohol use: Yes    Comment: Occasionally   Drug use: No     Allergies   Atuss da [[id number]]   Review of Systems Review of Systems Per HPI  Physical Exam Triage Vital Signs ED Triage Vitals  Enc Vitals Group     BP 02/19/21 1039 132/86     Pulse Rate 02/19/21 1039 (!) 108     Resp 02/19/21 1039 20     Temp 02/19/21 1039 98.7 F (37.1 C)     Temp Source 02/19/21 1039 Oral     SpO2 02/19/21 1039 93 %     Weight --      Height --      Head Circumference --      Peak Flow --      Pain Score 02/19/21 1043 6     Pain Loc --      Pain Edu? --      Excl. in [place]? --    No data found.  Updated Vital Signs BP 132/86 (BP Location: Right Arm)    Pulse (!) 108    Temp 98.7 F (37.1 C) (Oral)    Resp 20    LMP 02/13/2021 (Approximate)    SpO2 93%   Visual Acuity Right Eye Distance:   Left Eye Distance:   Bilateral Distance:    Right Eye Near:   Left Eye Near:    Bilateral Near:     Physical Exam Vitals and nursing note reviewed.  Constitutional:      Appearance: Normal  appearance.  HENT:     Head: Atraumatic.     Right Ear: Tympanic membrane and external ear normal.     Left Ear: Tympanic membrane  and external ear normal.     Nose: Rhinorrhea present.     Mouth/Throat:     Mouth: Mucous membranes are moist.     Pharynx: Posterior oropharyngeal erythema present. No oropharyngeal exudate.  Eyes:     Extraocular Movements: Extraocular movements intact.     Conjunctiva/sclera: Conjunctivae normal.  Cardiovascular:     Rate and Rhythm: Normal rate and regular rhythm.     Heart sounds: Normal heart sounds.  Pulmonary:     Effort: Pulmonary effort is normal.     Breath sounds: Wheezing present. No rales.     Comments: Moderate to significant wheezes throughout.  Speaking in full sentences, no respiratory distress on room air Musculoskeletal:        General: Normal range of motion.     Cervical back: Normal range of motion and neck supple.  Skin:    General: Skin is warm and dry.  Neurological:     Mental Status: She is alert and oriented to person, place, and time.     Motor: No weakness.     Gait: Gait normal.  Psychiatric:        Mood and Affect: Mood normal.        Thought Content: Thought content normal.     UC Treatments / Results  Labs (all labs ordered are listed, but only abnormal results are displayed) Labs Reviewed  COVID-19, FLU A+B NAA    EKG   Radiology No results found.  Procedures Procedures (including critical care time)  Medications Ordered in UC Medications  dexamethasone (DECADRON) injection 10 mg (10 mg Intramuscular Given 02/19/21 1106)    Initial Impression / Assessment and Plan / UC Course  I have reviewed the triage vital signs and the nursing notes.  Pertinent labs & imaging results that were available during my care of the patient were reviewed by me and considered in my medical decision making (see chart for details).     Low suspicion for strep given exam and symptoms, however already on amoxicillin  and may complete course just in case.  We will forego testing for strep as she has been on antibiotics for 3 days and the result will be  inaccurate.  COVID and flu test pending, COVID is my suspicion but will wait for results.  Treat with IM Decadron for asthma exacerbation secondary to viral upper respiratory infection, prednisone and Phenergan DM sent to pharmacy.  We will also send in Symbicort to take for the next month or so given the extent of her wheezing and chest tightness following this illness.  Continue albuterol nebulizer and inhaler as needed.  Return for any acutely worsening symptoms.  Final Clinical Impressions(s) / UC Diagnoses   Final diagnoses:  Viral URI with cough  Moderate persistent asthma with acute exacerbation  Wheezing  SOB (shortness of breath)   Discharge Instructions   None    ED Prescriptions     Medication Sig Dispense Auth. Provider   predniSONE (DELTASONE) 20 MG tablet Take 2 tablets (40 mg total) by mouth daily with breakfast. 10 tablet Volney American, PA-C   promethazine-dextromethorphan (PROMETHAZINE-DM) 6.25-15 MG/5ML syrup Take 5 mLs by mouth 4 (four) times daily as needed. 100 mL Volney American, PA-C   budesonide-formoterol [facility]) 160-4.5 MCG/ACT inhaler Inhale 2 puffs into the lungs 2 (two) times daily. Rinse mouth with water after each use 1 each Volney American, PA-C      PDMP not reviewed this encounter.   Merrie Roof  Benjamine Mola, PA-C 02/19/21 1119

## 2021-02-19 NOTE — ED Triage Notes (Signed)
Patient c/o fever, SOB, and generalized body aches x 4 days.   Patient endorses chills. Patient endorses productive cough w/ "green".   Patient endorses a temperature of 103.1 F at home.   Patient had a tele visit with another provider and was treated for strep.   Patient has taken Amoxicillin and Tessalon  [place] with no relief of symptoms.   Patient has taken Zofran with relief of symptoms.   History of Asthma.

## 2021-02-20 LAB — COVID-19, FLU A+B NAA
Influenza A, NAA: NOT DETECTED
Influenza B, NAA: NOT DETECTED
SARS-CoV-2, NAA: NOT DETECTED

## 2021-02-26 ENCOUNTER — Telehealth: Payer: Medicaid Other | Admitting: Nurse Practitioner

## 2021-02-26 DIAGNOSIS — J40 Bronchitis, not specified as acute or chronic: Secondary | ICD-10-CM | POA: Diagnosis not present

## 2021-02-26 DIAGNOSIS — J0110 Acute frontal sinusitis, unspecified: Secondary | ICD-10-CM | POA: Diagnosis not present

## 2021-02-26 MED ORDER — AZITHROMYCIN 250 MG PO TABS: ORAL_TABLET | ORAL | 0 refills | Status: AC

## 2021-02-26 NOTE — Progress Notes (Signed)
Virtual Visit Consent   Mary Hull, you are scheduled for a virtual visit with a [facility] provider today.     Just as with appointments in the office, your consent must be obtained to participate.  Your consent will be active for this visit and any virtual visit you may have with one of our providers in the next 365 days.     If you have a MyChart account, a copy of this consent can be sent to you electronically.  All virtual visits are billed to your insurance company just like a traditional visit in the office.    As this is a virtual visit, video technology does not allow for your provider to perform a traditional examination.  This may limit your provider's ability to fully assess your condition.  If your provider identifies any concerns that need to be evaluated in person or the need to arrange testing (such as labs, EKG, etc.), we will make arrangements to do so.     Although advances in technology are sophisticated, we cannot ensure that it will always work on either your end or our end.  If the connection with a video visit is poor, the visit may have to be switched to a telephone visit.  With either a video or telephone visit, we are not always able to ensure that we have a secure connection.     I need to obtain your verbal consent now.   Are you willing to proceed with your visit today?    Mary Hull has provided verbal consent on 02/26/2021 for a virtual visit (video or telephone).   Viviano Simas, FNP   Date: 02/26/2021 8:43 AM   Virtual Visit via Video Note   I, Viviano Simas, connected with  Mary Hull  ([id number], September 08, 1995) on 02/26/21 at  8:45 AM EST by a video-enabled telemedicine application and verified that I am speaking with the correct person using two identifiers.  Location: Patient: Virtual Visit Location Patient: Home Provider: Virtual Visit Location Provider: Home Office   I discussed the limitations of evaluation and management by  telemedicine and the availability of in person appointments. The patient expressed understanding and agreed to proceed.    History of Present Illness: Mary Hull is a 26 y.o. who identifies as a female who was assigned female at birth, and is being seen today with complaints of ongoing illness for the past 2 weeks. She was initially treated via telehealth visit for strep exposure. She had a sore throat at that time and son had strep throat. She was on Amoxicillin for 4 days and symptoms progressed into URI with asthma exacerbation. She was then seen at an Urgent Care.   She tested negative for Flu and COVID at that time (02/19/21)  She was instructed to finish Amoxicillin course  Was given steroid inhaler prescription at that time  Also treated with Decadron injection.   Today she has a cough that is productive and green, she has nasal congestion, she had a low grade fever last night 100.5, she feels her asthma is better controlled she denies active wheezing, has used her nebulizer as needed but feels her asthma is much improved.   She has had intermittent vomiting while on Amoxicillin but feels she has finished the majority of the antibiotic.   She is taking ibuprofen and Mucinex for relief.   She has also started on the Symbicort inhaler   Problems:  Patient Active Problem List  Diagnosis Date Noted   Abdominal pain 02/17/2019   Nausea without vomiting 02/17/2019   Microcytic anemia 02/17/2019   Elevated liver enzymes 03/18/2018   Gallbladder sludge    History of gestational diabetes 06/20/2016   Abnormal findings on diagnostic imaging of gall bladder 03/27/2016    Allergies:  Allergies  Allergen Reactions   Atuss Da [Pse-Bromphen-Chlophedianol] Hives   Medications:  Current Outpatient Medications:    albuterol (VENTOLIN HFA) 108 (90 Base) MCG/ACT inhaler, Inhale 1-2 puffs into the lungs every 6 (six) hours as needed for wheezing or shortness of breath., Disp: , Rfl:     amoxicillin (AMOXIL) 500 MG capsule, Take 1 capsule (500 mg total) by mouth 2 (two) times daily for 10 days., Disp: 20 capsule, Rfl: 0   benzonatate (TESSALON) 100 MG capsule, Take 1 capsule (100 mg total) by mouth 3 (three) times daily as needed., Disp: 30 capsule, Rfl: 0   budesonide-formoterol (SYMBICORT) 160-4.5 MCG/ACT inhaler, Inhale 2 puffs into the lungs 2 (two) times daily. Rinse mouth with water after each use, Disp: 1 each, Rfl: 0   ferrous sulfate 325 (65 FE) MG tablet, Take 325 mg by mouth at bedtime. (Patient not taking: Reported on 07/08/2019), Disp: , Rfl:    lidocaine (XYLOCAINE) 2 % solution, USING A SYRINGE PLACE 10mL IN THE BACK OF YOUR THROAT AND SWALLOW AS NEEDED FOR UPPER ABDOMINAL PAIN UP TO 3 TIMES DAILY. (Patient not taking: Reported on 07/08/2019), Disp: 100 mL, Rfl: 0   ondansetron (ZOFRAN ODT) 8 MG disintegrating tablet, Take 1 tablet (8 mg total) by mouth every 8 (eight) hours as needed for nausea. (Patient not taking: Reported on 07/08/2019), Disp: 20 tablet, Rfl: 0   ondansetron (ZOFRAN) 4 MG tablet, Take 1 tablet (4 mg total) by mouth every 8 (eight) hours as needed for nausea or vomiting., Disp: 20 tablet, Rfl: 0   pantoprazole (PROTONIX) 40 MG tablet, Take 1 tablet (40 mg total) by mouth daily before breakfast. (Patient not taking: Reported on 07/08/2019), Disp: 30 tablet, Rfl: 3   predniSONE (DELTASONE) 20 MG tablet, Take 2 tablets (40 mg total) by mouth daily with breakfast., Disp: 10 tablet, Rfl: 0   promethazine-dextromethorphan (PROMETHAZINE-DM) 6.25-15 MG/5ML syrup, Take 5 mLs by mouth 4 (four) times daily as needed., Disp: 100 mL, Rfl: 0  Observations/Objective: Patient is well-developed, well-nourished in no acute distress.  Resting comfortably at home.  Head is normocephalic, atraumatic.  No labored breathing.  Speech is clear and coherent with logical content.  Patient is alert and oriented at baseline.    Assessment and Plan: 1. Bronchitis  -  azithromycin (ZITHROMAX) 250 MG tablet; Take 2 tablets on day 1, then 1 tablet daily on days 2 through 5  Dispense: 6 tablet; Refill: 0 Continue Nebulizer Albuterol as needed Use Symbicort daily as directed  Increase water intake   2. Acute non-recurrent frontal sinusitis Continue Mucinex, increase water, may use ibuprofen for headache or fever relief.      Follow Up Instructions: I discussed the assessment and treatment plan with the patient. The patient was provided an opportunity to ask questions and all were answered. The patient agreed with the plan and demonstrated an understanding of the instructions.  A copy of instructions were sent to the patient via MyChart unless otherwise noted below.    The patient was advised to call back or seek an in-person evaluation if the symptoms worsen or if the condition fails to improve as anticipated.  Time:  I spent 10  minutes with the patient via telehealth technology discussing the above problems/concerns.    Viviano Simas, FNP

## 2021-05-14 ENCOUNTER — Ambulatory Visit
Admission: RE | Admit: 2021-05-14 | Discharge: 2021-05-14 | Disposition: A | Discharge status: Home / Self Care | Payer: Medicaid Other | Source: Ambulatory Visit | Attending: Family Medicine | Admitting: Family Medicine

## 2021-05-14 VITALS — BP 121/82 | HR 89 | Temp 97.8°F | Resp 18

## 2021-05-14 DIAGNOSIS — Z20828 Contact with and (suspected) exposure to other viral communicable diseases: Secondary | ICD-10-CM

## 2021-05-14 DIAGNOSIS — J069 Acute upper respiratory infection, unspecified: Secondary | ICD-10-CM | POA: Diagnosis not present

## 2021-05-14 DIAGNOSIS — R509 Fever, unspecified: Secondary | ICD-10-CM

## 2021-05-14 DIAGNOSIS — J4541 Moderate persistent asthma with (acute) exacerbation: Secondary | ICD-10-CM

## 2021-05-14 MED ORDER — DEXAMETHASONE SODIUM PHOSPHATE 10 MG/ML IJ SOLN
10.0000 mg | Freq: Once | INTRAMUSCULAR | Status: AC
  Administered 2021-05-14: 10 mg via INTRAMUSCULAR

## 2021-05-14 MED ORDER — PROMETHAZINE-DM 6.25-15 MG/5ML PO SYRP: 5.0000 mL | ORAL_SOLUTION | Freq: Four times a day (QID) | ORAL | 0 refills | Status: DC | PRN

## 2021-05-14 NOTE — ED Triage Notes (Signed)
Pt states that since yesterday she has been fatigue, coughing, fever and headache ? ?Pt states she tried Dayquil and Nyquil for the body aches ?

## 2021-05-16 LAB — COVID-19, FLU A+B NAA
Influenza A, NAA: NOT DETECTED
Influenza B, NAA: NOT DETECTED
SARS-CoV-2, NAA: NOT DETECTED

## 2021-06-22 ENCOUNTER — Emergency Department (HOSPITAL_COMMUNITY): Payer: Medicaid Other

## 2021-06-22 ENCOUNTER — Encounter (HOSPITAL_COMMUNITY): Payer: Self-pay | Admitting: Emergency Medicine

## 2021-06-22 ENCOUNTER — Other Ambulatory Visit: Payer: Self-pay

## 2021-06-22 ENCOUNTER — Emergency Department (HOSPITAL_COMMUNITY)
Admission: EM | Admit: 2021-06-22 | Discharge: 2021-06-22 | Disposition: A | Discharge status: Home / Self Care | Payer: Medicaid Other | Attending: Emergency Medicine | Admitting: Emergency Medicine

## 2021-06-22 DIAGNOSIS — D72829 Elevated white blood cell count, unspecified: Secondary | ICD-10-CM | POA: Diagnosis not present

## 2021-06-22 DIAGNOSIS — Z7951 Long term (current) use of inhaled steroids: Secondary | ICD-10-CM | POA: Insufficient documentation

## 2021-06-22 DIAGNOSIS — J45909 Unspecified asthma, uncomplicated: Secondary | ICD-10-CM | POA: Diagnosis not present

## 2021-06-22 DIAGNOSIS — R7309 Other abnormal glucose: Secondary | ICD-10-CM | POA: Diagnosis not present

## 2021-06-22 DIAGNOSIS — R1032 Left lower quadrant pain: Secondary | ICD-10-CM | POA: Insufficient documentation

## 2021-06-22 DIAGNOSIS — Z7952 Long term (current) use of systemic steroids: Secondary | ICD-10-CM | POA: Insufficient documentation

## 2021-06-22 DIAGNOSIS — R109 Unspecified abdominal pain: Secondary | ICD-10-CM | POA: Diagnosis present

## 2021-06-22 DIAGNOSIS — R112 Nausea with vomiting, unspecified: Secondary | ICD-10-CM | POA: Insufficient documentation

## 2021-06-22 DIAGNOSIS — N200 Calculus of kidney: Secondary | ICD-10-CM

## 2021-06-22 LAB — CBC
HCT: 45 % (ref 36.0–46.0)
Hemoglobin: 14.8 g/dL (ref 12.0–15.0)
MCH: 29.5 pg (ref 26.0–34.0)
MCHC: 32.9 g/dL (ref 30.0–36.0)
MCV: 89.8 fL (ref 80.0–100.0)
Platelets: 258 10*3/uL (ref 150–400)
RBC: 5.01 MIL/uL (ref 3.87–5.11)
RDW: 15.4 % (ref 11.5–15.5)
WBC: 15.1 10*3/uL — ABNORMAL HIGH (ref 4.0–10.5)
nRBC: 0 % (ref 0.0–0.2)

## 2021-06-22 LAB — URINALYSIS, ROUTINE W REFLEX MICROSCOPIC
Bacteria, UA: NONE SEEN
Bilirubin Urine: NEGATIVE
Glucose, UA: NEGATIVE mg/dL
Ketones, ur: 20 mg/dL — AB
Nitrite: NEGATIVE
Protein, ur: 30 mg/dL — AB
RBC / HPF: >50 RBC/hpf — ABNORMAL HIGH (ref 0–5)
Specific Gravity, Urine: 1.015 (ref 1.005–1.030)
pH: 5 (ref 5.0–8.0)

## 2021-06-22 LAB — COMPREHENSIVE METABOLIC PANEL
ALT: 14 U/L (ref 0–44)
AST: 22 U/L (ref 15–41)
Albumin: 4.8 g/dL (ref 3.5–5.0)
Alkaline Phosphatase: 51 U/L (ref 38–126)
Anion gap: 9 (ref 5–15)
BUN: 12 mg/dL (ref 6–20)
CO2: 23 mmol/L (ref 22–32)
Calcium: 9.6 mg/dL (ref 8.9–10.3)
Chloride: 106 mmol/L (ref 98–111)
Creatinine, Ser: 0.65 mg/dL (ref 0.44–1.00)
GFR, Estimated: >60 mL/min (ref >60)
Glucose, Bld: 106 mg/dL — ABNORMAL HIGH (ref 70–99)
Potassium: 4.1 mmol/L (ref 3.5–5.1)
Sodium: 138 mmol/L (ref 135–145)
Total Bilirubin: 0.3 mg/dL (ref 0.3–1.2)
Total Protein: 8 g/dL (ref 6.5–8.1)

## 2021-06-22 LAB — PREGNANCY, URINE: Preg Test, Ur: NEGATIVE

## 2021-06-22 LAB — LIPASE, BLOOD: Lipase: 23 U/L (ref 11–51)

## 2021-06-22 MED ORDER — ONDANSETRON 8 MG PO TBDP: 8.0000 mg | ORAL_TABLET | Freq: Three times a day (TID) | ORAL | 0 refills | Status: AC | PRN

## 2021-06-22 MED ORDER — KETOROLAC TROMETHAMINE 30 MG/ML IJ SOLN
30.0000 mg | Freq: Once | INTRAMUSCULAR | Status: AC
  Administered 2021-06-22: 30 mg via INTRAVENOUS
  Filled 2021-06-22: qty 1

## 2021-06-22 MED ORDER — TAMSULOSIN HCL 0.4 MG PO CAPS: 0.4000 mg | ORAL_CAPSULE | Freq: Every day | ORAL | 0 refills | Status: AC

## 2021-06-22 MED ORDER — CEPHALEXIN 500 MG PO CAPS: 500.0000 mg | ORAL_CAPSULE | Freq: Four times a day (QID) | ORAL | 0 refills | Status: DC

## 2021-06-22 MED ORDER — SODIUM CHLORIDE 0.9 % IV BOLUS: 1000.0000 mL | Freq: Once | INTRAVENOUS | Status: DC

## 2021-06-22 MED ORDER — OXYCODONE-ACETAMINOPHEN 5-325 MG PO TABS: 1.0000 | ORAL_TABLET | Freq: Four times a day (QID) | ORAL | 0 refills | Status: AC | PRN

## 2021-06-22 NOTE — Discharge Instructions (Addendum)
Take Keflex 4 times daily.  Take Zofran every 8 hours as needed for nausea and vomiting.  Take the Percocet for pain, if you take it make sure you have not the Zofran before to prevent vomiting.  He can take Flomax in the evenings that may help pass the stone if it still happening.  Follow-up with urology, call Monday to set up an appointment.  Information provided above.  You need to return to the ED if you start having to tolerate medicine by mouth, fever, worsening pain or symptoms.

## 2021-06-22 NOTE — ED Triage Notes (Signed)
Pt to the ED with left flank pain that began this morning. Pt states she had red tinted urine yesterday with no pain.  Pt states she has decreased urine output.

## 2021-06-22 NOTE — ED Provider Notes (Signed)
[facility]ERGENCY DEPARTMENT Provider Note   CSN: [id number] Arrival date & time: 06/22/21  [id number]     History  Chief Complaint  Patient presents with   Flank Pain    Mary BrackettKatelynn L Hull is a 26 y.o. female.   Flank Pain   Patient with medical history notable for asthma, GERD, MDD presents today due to left flank pain.  The pain started this morning, its constant but the severity is actually improving throughout the day.  Aggravated by moving, associated with one episode of emesis and some nausea.  States yesterday her urine had a red tinge, denies any dysuria or increased urinary frequency.  No fevers at home.  Denies any vaginal discharge, no new sexual partners.  She has had a kidney stone previously, states this pain initially felt worse.  No previous abdominal surgeries, has not tried anything over-the-counter for the pain yet.  Home Medications Prior to Admission medications   Medication Sig Start Date End Date Taking? Authorizing Provider  albuterol (VENTOLIN HFA) 108 (90 Base) MCG/ACT inhaler Inhale 1-2 puffs into the lungs every 6 (six) hours as needed for wheezing or shortness of breath.   Yes [provider]  benzonatate (TESSALON) 100 MG capsule Take 1 capsule (100 mg total) by mouth 3 (three) times daily as needed. Patient not taking: Reported on 06/22/2021 02/16/21   Margaretann LovelessBurnette, Jennifer M, PA-C  budesonide-formoterol [facility](SYMBICORT) 160-4.5 MCG/ACT inhaler Inhale 2 puffs into the lungs 2 (two) times daily. Rinse mouth with water after each use Patient not taking: Reported on 06/22/2021 02/19/21   Particia NearingLane, Rachel Elizabeth, PA-C  ferrous sulfate 325 (65 FE) MG tablet Take 325 mg by mouth at bedtime. Patient not taking: Reported on 07/08/2019    [provider]  lidocaine (XYLOCAINE) 2 % solution USING A SYRINGE PLACE 10mL IN THE BACK OF YOUR THROAT AND SWALLOW AS NEEDED FOR UPPER ABDOMINAL PAIN UP TO 3 TIMES DAILY. Patient not taking: Reported on 07/08/2019 03/04/19    Letta MedianHarper, Kristen S, PA-C  ondansetron (ZOFRAN ODT) 8 MG disintegrating tablet Take 1 tablet (8 mg total) by mouth every 8 (eight) hours as needed for nausea. Patient not taking: Reported on 07/08/2019 03/01/19   Derwood KaplanNanavati, Ankit, MD  ondansetron (ZOFRAN) 4 MG tablet Take 1 tablet (4 mg total) by mouth every 8 (eight) hours as needed for nausea or vomiting. Patient not taking: Reported on 06/22/2021 02/16/21   Margaretann LovelessBurnette, Jennifer M, PA-C  pantoprazole (PROTONIX) 40 MG tablet Take 1 tablet (40 mg total) by mouth daily before breakfast. Patient not taking: Reported on 07/08/2019 02/17/19   Letta MedianHarper, Kristen S, PA-C  predniSONE (DELTASONE) 20 MG tablet Take 2 tablets (40 mg total) by mouth daily with breakfast. Patient not taking: Reported on 06/22/2021 02/19/21   Particia NearingLane, Rachel Elizabeth, PA-C  promethazine-dextromethorphan (PROMETHAZINE-DM) 6.25-15 MG/5ML syrup Take 5 mLs by mouth 4 (four) times daily as needed. Patient not taking: Reported on 06/22/2021 02/19/21   Particia NearingLane, Rachel Elizabeth, PA-C  promethazine-dextromethorphan (PROMETHAZINE-DM) 6.25-15 MG/5ML syrup Take 5 mLs by mouth 4 (four) times daily as needed. Patient not taking: Reported on 06/22/2021 05/14/21   Particia NearingLane, Rachel Elizabeth, PA-C      Allergies    Patient has no known allergies.    Review of Systems   Review of Systems  Genitourinary:  Positive for flank pain.   Physical Exam Updated Vital Signs BP 129/79 (BP Location: Left Arm)   Pulse 71   Temp 98 F (36.7 C)   Resp 16  Ht 5\' 1"  (1.549 m)   Wt 77.1 kg   LMP 05/10/2021 (Approximate)   SpO2 99%   BMI 32.12 kg/m  Physical Exam Vitals and nursing note reviewed. Exam conducted with a chaperone present.  Constitutional:      Appearance: Normal appearance.  HENT:     Head: Normocephalic and atraumatic.  Eyes:     General: No scleral icterus.       Right eye: No discharge.        Left eye: No discharge.     Extraocular Movements: Extraocular movements intact.     Pupils: Pupils  are equal, round, and reactive to light.  Cardiovascular:     Rate and Rhythm: Normal rate and regular rhythm.     Pulses: Normal pulses.     Heart sounds: Normal heart sounds. No murmur heard.   No friction rub. No gallop.  Pulmonary:     Effort: Pulmonary effort is normal. No respiratory distress.     Breath sounds: Normal breath sounds.  Abdominal:     General: Abdomen is flat. Bowel sounds are normal. There is no distension.     Palpations: Abdomen is soft.     Tenderness: There is abdominal tenderness. There is left CVA tenderness.     Comments: Left CVA tenderness, left lower quadrant tenderness.  Abdomen is soft, no rigidity or guarding  Skin:    General: Skin is warm and dry.     Coloration: Skin is not jaundiced.  Neurological:     Mental Status: She is alert. Mental status is at baseline.     Coordination: Coordination normal.    ED Results / Procedures / Treatments   Labs (all labs ordered are listed, but only abnormal results are displayed) Labs Reviewed  CBC - Abnormal; Notable for the following components:      Result Value   WBC 15.1 (*)    All other components within normal limits  COMPREHENSIVE METABOLIC PANEL - Abnormal; Notable for the following components:   Glucose, Bld 106 (*)    All other components within normal limits  URINALYSIS, ROUTINE W REFLEX MICROSCOPIC - Abnormal; Notable for the following components:   APPearance HAZY (*)    Hgb urine dipstick LARGE (*)    Ketones, ur 20 (*)    Protein, ur 30 (*)    Leukocytes,Ua TRACE (*)    RBC / HPF >50 (*)    All other components within normal limits  URINE CULTURE  LIPASE, BLOOD  PREGNANCY, URINE  POC URINE PREG, ED    EKG None  Radiology 05/12/2021 Renal  Result Date: 06/22/2021 CLINICAL DATA:  Question left hydronephrosis. EXAM: RENAL / URINARY TRACT ULTRASOUND COMPLETE COMPARISON:  CT of the abdomen pelvis 01/04/2019 FINDINGS: Right Kidney: Renal measurements: 11.5 x 4.5 x 5.8 cm = volume: 158 mL.  Echogenicity within normal limits. No mass or hydronephrosis visualized. Left Kidney: Renal measurements: 11.2 x 5.9 x 5.4 cm = volume: 186 mL. Echogenicity within normal limits. Mild fullness the left renal collecting system noted. No obstructing lesion evident. No stones or mass. Bladder: Appears normal for degree of bladder distention. Bilateral ureteral jets documented. Other: None. IMPRESSION: 1. Mild fullness the left renal collecting system without identified obstructing lesion. 2. Left kidney otherwise within normal limits. 3. Normal sonographic appearance of the right kidney and urinary bladder. Electronically Signed   By: 14/07/2018 M.D.   On: 06/22/2021 13:59    Procedures Procedures    Medications Ordered in ED  Medications  ketorolac (TORADOL) 30 MG/ML injection 30 mg (30 mg Intravenous Given 06/22/21 1054)    ED Course/ Medical Decision Making/ A&P                           Medical Decision Making Amount and/or Complexity of Data Reviewed Labs: ordered. Radiology: ordered.  Risk Prescription drug management.   Patient presents with left flank pain.  Differential diagnosis includes but is not limited to nephrolithiasis, UTI, pyelonephritis, muscle strain, diverticulitis, ovarian torsion, ectopic pregnancy, ovarian cyst, pelvic inflammatory disease, BV, GC/chlamydia.  On exam patient has left-sided abdominal tenderness as well as left CVA tenderness.  Vital signs are stable, she is not febrile or tachycardic.  Abdomen is soft without rigidity or peritoneal signs.  I reviewed external records including patient's GI appointment from 2021.  She had unremarkable upper endoscopy and colonoscopy in February 2021, has not followed up with GI since then.     Lab Tests:  I ordered, viewed, and personally interpreted labs.  The pertinent results include:   Patient has a leukocytosis of 15.1.  There is no gross electrolyte derangement or AKI, sugar slightly elevated at 106.   Lipase is within normal limits.  UA with large hbg, trace leukocytes and greater than 10 white blood cells.  Urine culture obtained and pending.  Patient is not pregnant.   Imaging Studies ordered:  I directly visualized the renal ultrasound, which showed mild fullness in the left renal collecting system but no gross hydronephrosis.  Considered ovarian torsion but based on overall clinical picture I doubt in the context of more nephrolithiasis.  I agree with the radiologist interpretation    ECG/Cardiac monitoring:    The patient was maintained on a cardiac monitor.  Visualized monitor strip which showed NSR per my interpretation.    Medicines ordered and prescription drug management:  I ordered medication including: Toradol  I have reviewed the patients home medicines and have made adjustments as needed   Test Considered:  Considered pelvic but patient's presentation is more consistent with kidney stone.  Do not think additional imaging such as CT is needed at this time, also do not think admission warranted.   Reevaluation:  After the interventions noted above, I reevaluated the patient and found pain controlled   Problems addressed / ED Course: Flank pain-suspect nephrolithiasis.  No hydronephrosis, no AKI.  Patient is tolerating p.o. so does not require IV medication.  Pain is controlled on reevaluation.  Will discharge with Flomax and urology follow-up.  As well as antiemetics and pain medicine. Infected stone versus UTI-will cover with Keflex.  Given she does have a slight leukocytosis and there is leukocytes in the urine with greater than 21 WBCs.    Disposition:   After consideration of the diagnostic results and the patients response to treatment, I feel that the patent would benefit from D/C.           Final Clinical Impression(s) / ED Diagnoses Final diagnoses:  None    Rx / DC Orders ED Discharge Orders     None         Theron Arista,  PA-C 06/22/21 1505    Derwood Kaplan, MD 06/24/21 [phone]

## 2021-06-25 LAB — URINE CULTURE: Culture: 30000 — AB

## 2021-06-26 ENCOUNTER — Telehealth: Payer: Self-pay | Admitting: *Deleted

## 2021-06-26 NOTE — Telephone Encounter (Signed)
Post ED Visit - Positive Culture Follow-up  Culture report reviewed by antimicrobial stewardship pharmacist: [facility] []  , Pharm.D. []  Enzo Bi, Pharm.D., BCPS AQ-ID []  , Pharm.D., BCPS []  Celedonio Miyamoto, Pharm.D., BCPS []  [place], Garvin Fila.D., BCPS, AAHIVP []  , Pharm.D., BCPS, AAHIVP []  Georgina Pillion, PharmD, BCPS []  , PharmD, BCPS []  [place], PharmD, BCPS []  [address], PharmD []  , PharmD, BCPS []  Estella Husk, PharmD  Pharmacy Team []  Lysle Pearl, PharmD []  , PharmD []  Phillips Climes, PharmD []  , Rph []  Agapito Games) , PharmD []  Verlan Friends, PharmD []  , PharmD []  Mervyn Gay, PharmD []  , PharmD []  Vinnie Level, PharmD []  [facility], PharmD []  , PharmD []  Len Childs, PharmD   Positive urine culture Treated with Ce[halexin, organism sensitive to the same and no further patient follow-up is required at this time. , PharmD  Greer Pickerel Talley 06/26/2021, 8:07 AM

## 2021-10-04 ENCOUNTER — Other Ambulatory Visit: Payer: Self-pay

## 2021-10-04 ENCOUNTER — Ambulatory Visit
Admission: RE | Admit: 2021-10-04 | Discharge: 2021-10-04 | Disposition: A | Discharge status: Home / Self Care | Payer: Medicaid Other | Source: Ambulatory Visit | Attending: Nurse Practitioner | Admitting: Nurse Practitioner

## 2021-10-04 VITALS — BP 127/88 | HR 84 | Temp 98.6°F | Resp 20

## 2021-10-04 DIAGNOSIS — R051 Acute cough: Secondary | ICD-10-CM | POA: Insufficient documentation

## 2021-10-04 DIAGNOSIS — J45901 Unspecified asthma with (acute) exacerbation: Secondary | ICD-10-CM | POA: Diagnosis present

## 2021-10-04 DIAGNOSIS — Z20822 Contact with and (suspected) exposure to covid-19: Secondary | ICD-10-CM | POA: Insufficient documentation

## 2021-10-04 DIAGNOSIS — J069 Acute upper respiratory infection, unspecified: Secondary | ICD-10-CM | POA: Diagnosis present

## 2021-10-04 LAB — RESP PANEL BY RT-PCR (FLU A&B, COVID) ARPGX2
Influenza A by PCR: NEGATIVE
Influenza B by PCR: NEGATIVE
SARS Coronavirus 2 by RT PCR: NEGATIVE

## 2021-10-04 MED ORDER — CETIRIZINE HCL 10 MG PO TABS: 10.0000 mg | ORAL_TABLET | Freq: Every day | ORAL | 0 refills | Status: AC

## 2021-10-04 MED ORDER — AZITHROMYCIN 250 MG PO TABS: 250.0000 mg | ORAL_TABLET | Freq: Every day | ORAL | 0 refills | Status: AC

## 2021-10-04 MED ORDER — FLUTICASONE PROPIONATE 50 MCG/ACT NA SUSP: 2.0000 | Freq: Every day | NASAL | 0 refills | Status: DC

## 2021-10-04 MED ORDER — PREDNISONE 20 MG PO TABS: 40.0000 mg | ORAL_TABLET | Freq: Every day | ORAL | 0 refills | Status: AC

## 2021-10-04 NOTE — ED Triage Notes (Addendum)
Pt reports sore throat,cough, nasal congestion, facial pressure since monday.reports history of similar. Denies any known fever.

## 2021-10-04 NOTE — ED Provider Notes (Signed)
[facility]    CSN: [id number] Arrival date & time: 10/04/21  1048      History   Chief Complaint Chief Complaint  Patient presents with   Sore Throat    Sore throat, body aches, cough, chest congestion since Monday. Self medicated with DayQuil/NyQuil. - Entered by patient    HPI Mary Hull is a 26 y.o. female.   The history is provided by the patient.   Patient presents with a 4-day history of upper respiratory symptoms.  Patient complains of sore throat, generalized body aches, headache, cough, and chest congestion.  She states that initially when her symptoms started, her throat pain was worse, but that has since improved.  She denies fever, chills, ear pain, shortness of breath, difficulty breathing, or GI symptoms.  Patient denies any known sick contacts.  Patient states that she does have a history of asthma and has had to use her rescue inhaler.  She states that she is currently on a steroid inhaler for her symptoms. Past Medical History:  Diagnosis Date   Asthma    Gallbladder sludge    Gestational diabetes    diet controlled   Postpartum depression     Patient Active Problem List   Diagnosis Date Noted   Abdominal pain 02/17/2019   Nausea without vomiting 02/17/2019   Microcytic anemia 02/17/2019   Elevated liver enzymes 03/18/2018   Gallbladder sludge    History of gestational diabetes 06/20/2016   Abnormal findings on diagnostic imaging of gall bladder 03/27/2016    Past Surgical History:  Procedure Laterality Date   ADENOIDECTOMY     COLONOSCOPY WITH PROPOFOL N/A 03/08/2019   Procedure: COLONOSCOPY WITH PROPOFOL;  Surgeon: Corbin Ade, MD;  Entire examined colon is normal.  Distal TI appeared normal.  Repeat at age 42 for screening purposes.  Suspected IDA secondary to heavy menstrual cycles, possible element of iron malabsorption.   ESOPHAGOGASTRODUODENOSCOPY (EGD) WITH PROPOFOL N/A 03/08/2019   Procedure: ESOPHAGOGASTRODUODENOSCOPY  (EGD) WITH PROPOFOL;  Surgeon: Corbin Ade, MD;  Mild erosive reflux esophagitis, normal stomach, normal examined duodenum.   FRACTURE SURGERY Left    L elbow   TONSILLECTOMY      OB History     Gravida  2   Para  2   Term  2   Preterm      AB      Living  2      SAB      IAB      Ectopic      Multiple  0   Live Births  2            Home Medications    Prior to Admission medications   Medication Sig Start Date End Date Taking? Authorizing Provider  azithromycin (ZITHROMAX) 250 MG tablet Take 1 tablet (250 mg total) by mouth daily. Take first 2 tablets together, then 1 every day until finished. 10/04/21  Yes Stevenson Windmiller-Warren, Sadie Haber, NP  cetirizine (ZYRTEC) 10 MG tablet Take 1 tablet (10 mg total) by mouth daily. 10/04/21  Yes Jeannett Dekoning-Warren, Sadie Haber, NP  fluticasone (FLONASE) 50 MCG/ACT nasal spray Place 2 sprays into both nostrils daily. 10/04/21  Yes Trinidy Masterson-Warren, Sadie Haber, NP  predniSONE (DELTASONE) 20 MG tablet Take 2 tablets (40 mg total) by mouth daily with breakfast for 5 days. 10/04/21 10/09/21 Yes Tahjay Binion-Warren, Sadie Haber, NP  albuterol (VENTOLIN HFA) 108 (90 Base) MCG/ACT inhaler Inhale 1-2 puffs into the lungs every 6 (six) hours as  needed for wheezing or shortness of breath.    [provider]  benzonatate (TESSALON) 100 MG capsule Take 1 capsule (100 mg total) by mouth 3 (three) times daily as needed. Patient not taking: Reported on 06/22/2021 02/16/21   Margaretann Loveless, PA-C  budesonide-formoterol [facility]) 160-4.5 MCG/ACT inhaler Inhale 2 puffs into the lungs 2 (two) times daily. Rinse mouth with water after each use Patient not taking: Reported on 06/22/2021 02/19/21   Particia Nearing, PA-C  cephALEXin (KEFLEX) 500 MG capsule Take 1 capsule (500 mg total) by mouth 4 (four) times daily. 06/22/21   Theron Arista, PA-C  ferrous sulfate 325 (65 FE) MG tablet Take 325 mg by mouth at bedtime. Patient not taking: Reported on 07/08/2019     [provider]  lidocaine (XYLOCAINE) 2 % solution USING A SYRINGE PLACE 28mL IN THE BACK OF YOUR THROAT AND SWALLOW AS NEEDED FOR UPPER ABDOMINAL PAIN UP TO 3 TIMES DAILY. Patient not taking: Reported on 07/08/2019 03/04/19   Letta Median, PA-C  ondansetron (ZOFRAN-ODT) 8 MG disintegrating tablet Take 1 tablet (8 mg total) by mouth every 8 (eight) hours as needed for nausea or vomiting. 06/22/21   Theron Arista, PA-C  oxyCODONE-acetaminophen (PERCOCET/ROXICET) 5-325 MG tablet Take 1 tablet by mouth every 6 (six) hours as needed for severe pain. 06/22/21   Theron Arista, PA-C  pantoprazole (PROTONIX) 40 MG tablet Take 1 tablet (40 mg total) by mouth daily before breakfast. Patient not taking: Reported on 07/08/2019 02/17/19   Letta Median, PA-C  promethazine-dextromethorphan (PROMETHAZINE-DM) 6.25-15 MG/5ML syrup Take 5 mLs by mouth 4 (four) times daily as needed. Patient not taking: Reported on 06/22/2021 02/19/21   Particia Nearing, PA-C  promethazine-dextromethorphan (PROMETHAZINE-DM) 6.25-15 MG/5ML syrup Take 5 mLs by mouth 4 (four) times daily as needed. Patient not taking: Reported on 06/22/2021 05/14/21   Particia Nearing, PA-C  tamsulosin (FLOMAX) 0.4 MG CAPS capsule Take 1 capsule (0.4 mg total) by mouth daily after supper. 06/22/21   Theron Arista, PA-C    Family History Family History  Problem Relation Age of Onset   Anxiety disorder Father    Hypertension Maternal Grandmother    Hypertension Maternal Aunt    Colon cancer Neg Hx     Social History Social History   Tobacco Use   Smoking status: Some Days    Packs/day: 0.25    Years: 1.00    Total pack years: 0.25    Types: Cigarettes   Smokeless tobacco: Never  Vaping Use   Vaping Use: Never used  Substance Use Topics   Alcohol use: Yes    Comment: Occasionally   Drug use: No     Allergies   Patient has no known allergies.   Review of Systems Review of Systems Per HPI  Physical Exam Triage  Vital Signs ED Triage Vitals  Enc Vitals Group     BP 10/04/21 1124 127/88     Pulse Rate 10/04/21 1124 84     Resp 10/04/21 1124 20     Temp 10/04/21 1124 98.6 F (37 C)     Temp Source 10/04/21 1124 Oral     SpO2 10/04/21 1124 94 %     Weight --      Height --      Head Circumference --      Peak Flow --      Pain Score 10/04/21 1122 2     Pain Loc --      Pain  Edu? --      Excl. in GC? --    No data found.  Updated Vital Signs BP 127/88 (BP Location: Right Arm)   Pulse 84   Temp 98.6 F (37 C) (Oral)   Resp 20   LMP 09/28/2021 (Approximate)   SpO2 94%   Visual Acuity Right Eye Distance:   Left Eye Distance:   Bilateral Distance:    Right Eye Near:   Left Eye Near:    Bilateral Near:     Physical Exam Vitals and nursing note reviewed.  Constitutional:      General: She is not in acute distress.    Appearance: She is well-developed.  HENT:     Head: Normocephalic.     Right Ear: Tympanic membrane and ear canal normal.     Left Ear: Tympanic membrane and ear canal normal.     Nose: Congestion present. No rhinorrhea.     Mouth/Throat:     Mouth: Mucous membranes are moist.     Tonsils: No tonsillar exudate.  Eyes:     Conjunctiva/sclera: Conjunctivae normal.     Pupils: Pupils are equal, round, and reactive to light.  Cardiovascular:     Rate and Rhythm: Normal rate and regular rhythm.     Heart sounds: Normal heart sounds.  Pulmonary:     Effort: Pulmonary effort is normal. No respiratory distress.     Breath sounds: No stridor. Wheezing (posterior lobes throughout) present. No rhonchi or rales.  Chest:     Chest wall: No tenderness.  Abdominal:     General: Bowel sounds are normal. There is no distension.     Palpations: Abdomen is soft. There is no mass.     Tenderness: There is no abdominal tenderness. There is no guarding or rebound.  Musculoskeletal:     Cervical back: Normal range of motion.  Lymphadenopathy:     Cervical: No cervical  adenopathy.  Skin:    General: Skin is warm and dry.  Neurological:     General: No focal deficit present.     Mental Status: She is alert and oriented to person, place, and time.  Psychiatric:        Mood and Affect: Mood normal.        Behavior: Behavior normal.      UC Treatments / Results  Labs (all labs ordered are listed, but only abnormal results are displayed) Labs Reviewed - No data to display  EKG   Radiology No results found.  Procedures Procedures (including critical care time)  Medications Ordered in UC Medications - No data to display  Initial Impression / Assessment and Plan / UC Course  I have reviewed the triage vital signs and the nursing notes.  Pertinent labs & imaging results that were available during my care of the patient were reviewed by me and considered in my medical decision making (see chart for details).  Patient presents with upper respiratory symptoms that been present for the past 4 days.  On exam, patient has wheezing throughout the posterior lobes.  She is not in any acute distress, her vital signs are stable.  Based on her history of asthma, symptoms appear to be consistent with a viral upper respiratory infection with a mild asthma exacerbation.  COVID test is pending.  Patient is a candidate for Paxlovid if the results are positive.  We will treat patient prophylactically with azithromycin, fluticasone, Bromfed, and Zyrtec.  Supportive care recommendations were provided to the patient.  Patient verbalizes understanding.  All questions were answered. Final Clinical Impressions(s) / UC Diagnoses   Final diagnoses:  Asthma with acute exacerbation, unspecified asthma severity, unspecified whether persistent  Acute upper respiratory infection     Discharge Instructions      A COVID test is pending. You will be contacted if the result is positive. You can receive Paxlovid if the result is positive. Take medication as  prescribed. Increase fluids and allow for plenty of rest. Recommend Tylenol or ibuprofen as needed for pain, fever, or general discomfort. Recommend using a humidifier at bedtime during sleep to help with cough and nasal congestion. Sleep elevated on 2 pillows while symptoms persist. Go to the ER if you develop worsening SOB, difficulty breathing or other concerns.  Follow-up if your symptoms do not improve.     ED Prescriptions     Medication Sig Dispense Auth. Provider   predniSONE (DELTASONE) 20 MG tablet Take 2 tablets (40 mg total) by mouth daily with breakfast for 5 days. 10 tablet Jaymison Luber-Warren, Sadie Haber, NP   fluticasone (FLONASE) 50 MCG/ACT nasal spray Place 2 sprays into both nostrils daily. 16 g Jazzlynn Rawe-Warren, Sadie Haber, NP   cetirizine (ZYRTEC) 10 MG tablet Take 1 tablet (10 mg total) by mouth daily. 30 tablet Berneda Piccininni-Warren, Sadie Haber, NP   azithromycin (ZITHROMAX) 250 MG tablet Take 1 tablet (250 mg total) by mouth daily. Take first 2 tablets together, then 1 every day until finished. 6 tablet Breanda Greenlaw-Warren, Sadie Haber, NP      PDMP not reviewed this encounter.   Abran Cantor, NP 10/04/21 1158

## 2021-10-04 NOTE — Discharge Instructions (Addendum)
A COVID test is pending. You will be contacted if the result is positive. You can receive Paxlovid if the result is positive. Take medication as prescribed. Increase fluids and allow for plenty of rest. Recommend Tylenol or ibuprofen as needed for pain, fever, or general discomfort. Recommend using a humidifier at bedtime during sleep to help with cough and nasal congestion. Sleep elevated on 2 pillows while symptoms persist. Go to the ER if you develop worsening SOB, difficulty breathing or other concerns.  Follow-up if your symptoms do not improve.

## 2021-12-24 ENCOUNTER — Telehealth: Payer: Medicaid Other | Admitting: Nurse Practitioner

## 2021-12-24 DIAGNOSIS — R110 Nausea: Secondary | ICD-10-CM

## 2021-12-24 DIAGNOSIS — J069 Acute upper respiratory infection, unspecified: Secondary | ICD-10-CM | POA: Diagnosis not present

## 2021-12-24 MED ORDER — PREDNISONE 10 MG (21) PO TBPK
ORAL_TABLET | ORAL | 0 refills | Status: DC
Start: 2021-12-24 — End: 2022-03-31

## 2021-12-24 MED ORDER — ONDANSETRON HCL 4 MG PO TABS: 4.0000 mg | ORAL_TABLET | Freq: Three times a day (TID) | ORAL | 0 refills | Status: AC | PRN

## 2021-12-24 NOTE — Patient Instructions (Signed)
1. Viral upper respiratory tract infection Albuterol (rescue inhaler or nebulizer) every 4-6 hours  Continue Symbicort  Restart Flonase  Switch from Dayquil to Mucinex  Push fluids  Protein foods Richardean Chimera  - predniSONE (STERAPRED UNI-PAK 21 TAB) 10 MG (21) TBPK tablet; Take 6 tablets on day one, 5 on day two, 4 on day three, 3 on day four, 2 on day five, and 1 on day six. Take with food.  Dispense: 21 tablet; Refill: 0 take with food  2. Nausea  - ondansetron (ZOFRAN) 4 MG tablet; Take 1 tablet (4 mg total) by mouth every 8 (eight) hours as needed for nausea or vomiting.  Dispense: 20 tablet; Refill: 0 as needed for nausea

## 2021-12-24 NOTE — Progress Notes (Signed)
Virtual Visit Consent   Mary Hull, you are scheduled for a virtual visit with a [facility] provider today. Just as with appointments in the office, your consent must be obtained to participate. Your consent will be active for this visit and any virtual visit you may have with one of our providers in the next 365 days. If you have a MyChart account, a copy of this consent can be sent to you electronically.  As this is a virtual visit, video technology does not allow for your provider to perform a traditional examination. This may limit your provider's ability to fully assess your condition. If your provider identifies any concerns that need to be evaluated in person or the need to arrange testing (such as labs, EKG, etc.), we will make arrangements to do so. Although advances in technology are sophisticated, we cannot ensure that it will always work on either your end or our end. If the connection with a video visit is poor, the visit may have to be switched to a telephone visit. With either a video or telephone visit, we are not always able to ensure that we have a secure connection.  By engaging in this virtual visit, you consent to the provision of healthcare and authorize for your insurance to be billed (if applicable) for the services provided during this visit. Depending on your insurance coverage, you may receive a charge related to this service.  I need to obtain your verbal consent now. Are you willing to proceed with your visit today? Mary Hull has provided verbal consent on 12/24/2021 for a virtual visit (video or telephone). Viviano Simas, FNP  Date: 12/24/2021 7:34 AM  Virtual Visit via Video Note   I, Viviano Simas, connected with  NIMRIT KEHRES  ([id number], 1995/06/11) on 12/24/21 at  8:00 AM EST by a video-enabled telemedicine application and verified that I am speaking with the correct person using two identifiers.  Location: Patient: Virtual Visit Location  Patient: Home Provider: Virtual Visit Location Provider: Home Office   I discussed the limitations of evaluation and management by telemedicine and the availability of in person appointments. The patient expressed understanding and agreed to proceed.    History of Present Illness: Mary SHACKETT is a 26 y.o. who identifies as a female who was assigned female at birth, and is being seen today with complaints of chills and sweating. She has a cough that is exacerbating her asthma.  She also has been feeling nauseated.   Her symptoms started 48 hours ago.   She has a 25 year old with URI symptoms   She has had COVID 2 years ago.  She has asthma uses Symbicort daily  She used her nebulizer last night with Albuterol for the first time since feeling ill.  She has also been taking Dayquil and used something for nausea last night.   She also continued to use Zyrtec    Problems:  Patient Active Problem List   Diagnosis Date Noted   Abdominal pain 02/17/2019   Nausea without vomiting 02/17/2019   Microcytic anemia 02/17/2019   Elevated liver enzymes 03/18/2018   Gallbladder sludge    History of gestational diabetes 06/20/2016   Abnormal findings on diagnostic imaging of gall bladder 03/27/2016    Allergies: No Known Allergies Medications:  Current Outpatient Medications:    albuterol (VENTOLIN HFA) 108 (90 Base) MCG/ACT inhaler, Inhale 1-2 puffs into the lungs every 6 (six) hours as needed for wheezing or shortness of breath.,  Disp: , Rfl:    azithromycin (ZITHROMAX) 250 MG tablet, Take 1 tablet (250 mg total) by mouth daily. Take first 2 tablets together, then 1 every day until finished., Disp: 6 tablet, Rfl: 0   benzonatate (TESSALON) 100 MG capsule, Take 1 capsule (100 mg total) by mouth 3 (three) times daily as needed. (Patient not taking: Reported on 06/22/2021), Disp: 30 capsule, Rfl: 0   budesonide-formoterol (SYMBICORT) 160-4.5 MCG/ACT inhaler, Inhale 2 puffs into the lungs 2  (two) times daily. Rinse mouth with water after each use (Patient not taking: Reported on 06/22/2021), Disp: 1 each, Rfl: 0   cephALEXin (KEFLEX) 500 MG capsule, Take 1 capsule (500 mg total) by mouth 4 (four) times daily., Disp: 20 capsule, Rfl: 0   cetirizine (ZYRTEC) 10 MG tablet, Take 1 tablet (10 mg total) by mouth daily., Disp: 30 tablet, Rfl: 0   ferrous sulfate 325 (65 FE) MG tablet, Take 325 mg by mouth at bedtime. (Patient not taking: Reported on 07/08/2019), Disp: , Rfl:    fluticasone (FLONASE) 50 MCG/ACT nasal spray, Place 2 sprays into both nostrils daily., Disp: 16 g, Rfl: 0   lidocaine (XYLOCAINE) 2 % solution, USING A SYRINGE PLACE 32mL IN THE BACK OF YOUR THROAT AND SWALLOW AS NEEDED FOR UPPER ABDOMINAL PAIN UP TO 3 TIMES DAILY. (Patient not taking: Reported on 07/08/2019), Disp: 100 mL, Rfl: 0   ondansetron (ZOFRAN-ODT) 8 MG disintegrating tablet, Take 1 tablet (8 mg total) by mouth every 8 (eight) hours as needed for nausea or vomiting., Disp: 20 tablet, Rfl: 0   oxyCODONE-acetaminophen (PERCOCET/ROXICET) 5-325 MG tablet, Take 1 tablet by mouth every 6 (six) hours as needed for severe pain., Disp: 6 tablet, Rfl: 0   pantoprazole (PROTONIX) 40 MG tablet, Take 1 tablet (40 mg total) by mouth daily before breakfast. (Patient not taking: Reported on 07/08/2019), Disp: 30 tablet, Rfl: 3   promethazine-dextromethorphan (PROMETHAZINE-DM) 6.25-15 MG/5ML syrup, Take 5 mLs by mouth 4 (four) times daily as needed. (Patient not taking: Reported on 06/22/2021), Disp: 100 mL, Rfl: 0   promethazine-dextromethorphan (PROMETHAZINE-DM) 6.25-15 MG/5ML syrup, Take 5 mLs by mouth 4 (four) times daily as needed. (Patient not taking: Reported on 06/22/2021), Disp: 100 mL, Rfl: 0   tamsulosin (FLOMAX) 0.4 MG CAPS capsule, Take 1 capsule (0.4 mg total) by mouth daily after supper., Disp: 30 capsule, Rfl: 0  Observations/Objective: Patient is well-developed, well-nourished in no acute distress.  Resting  comfortably  at home.  Head is normocephalic, atraumatic.  No labored breathing.  Speech is clear and coherent with logical content.  Patient is alert and oriented at baseline.    Assessment and Plan: 1. Viral upper respiratory tract infection Albuterol every 4-6 hours  Continue Symbicort  Restart Flonase  Switch from Dayquil to Mucinex  Push fluids  Protein foods Richardean Chimera  - predniSONE (STERAPRED UNI-PAK 21 TAB) 10 MG (21) TBPK tablet; Take 6 tablets on day one, 5 on day two, 4 on day three, 3 on day four, 2 on day five, and 1 on day six. Take with food.  Dispense: 21 tablet; Refill: 0  2. Nausea  - ondansetron (ZOFRAN) 4 MG tablet; Take 1 tablet (4 mg total) by mouth every 8 (eight) hours as needed for nausea or vomiting.  Dispense: 20 tablet; Refill: 0     Follow Up Instructions: I discussed the assessment and treatment plan with the patient. The patient was provided an opportunity to ask questions and all were answered. The patient agreed with the  plan and demonstrated an understanding of the instructions.  A copy of instructions were sent to the patient via MyChart unless otherwise noted below.    The patient was advised to call back or seek an in-person evaluation if the symptoms worsen or if the condition fails to improve as anticipated.  Time:  I spent 10 minutes with the patient via telehealth technology discussing the above problems/concerns.    Viviano Simas, FNP

## 2022-03-31 ENCOUNTER — Telehealth: Payer: Medicaid Other | Admitting: Nurse Practitioner

## 2022-03-31 ENCOUNTER — Other Ambulatory Visit: Payer: Self-pay | Admitting: Nurse Practitioner

## 2022-03-31 DIAGNOSIS — J069 Acute upper respiratory infection, unspecified: Secondary | ICD-10-CM

## 2022-03-31 MED ORDER — BENZONATATE 100 MG PO CAPS: 100.0000 mg | ORAL_CAPSULE | Freq: Three times a day (TID) | ORAL | 0 refills | Status: DC | PRN

## 2022-03-31 MED ORDER — BUDESONIDE-FORMOTEROL FUMARATE 160-4.5 MCG/ACT IN AERO: 2.0000 | INHALATION_SPRAY | Freq: Two times a day (BID) | RESPIRATORY_TRACT | 0 refills | Status: AC

## 2022-03-31 MED ORDER — FLUTICASONE PROPIONATE 50 MCG/ACT NA SUSP: 2.0000 | Freq: Every day | NASAL | 0 refills | Status: DC

## 2022-03-31 MED ORDER — PREDNISONE 20 MG PO TABS: 20.0000 mg | ORAL_TABLET | Freq: Every day | ORAL | 0 refills | Status: AC

## 2022-03-31 NOTE — Addendum Note (Signed)
Addended by: Geryl Rankins on: 03/31/2022 03:30 PM   Modules accepted: Orders

## 2022-03-31 NOTE — Progress Notes (Signed)

## 2022-03-31 NOTE — Progress Notes (Signed)
I have spent 5 minutes in review of e-visit questionnaire, review and updating patient chart, medical decision making and response to patient.  ° °Mary Hull W Fransico Sciandra, NP ° °  °

## 2022-05-06 ENCOUNTER — Telehealth: Payer: Medicaid Other | Admitting: Family Medicine

## 2022-05-06 DIAGNOSIS — J069 Acute upper respiratory infection, unspecified: Secondary | ICD-10-CM

## 2022-05-06 MED ORDER — FLUTICASONE PROPIONATE 50 MCG/ACT NA SUSP: 2.0000 | Freq: Every day | NASAL | 0 refills | Status: AC

## 2022-05-06 MED ORDER — BENZONATATE 100 MG PO CAPS: 100.0000 mg | ORAL_CAPSULE | Freq: Three times a day (TID) | ORAL | 0 refills | Status: AC | PRN

## 2022-05-06 NOTE — Progress Notes (Signed)

## 2023-02-05 ENCOUNTER — Other Ambulatory Visit: Payer: Self-pay

## 2023-02-05 ENCOUNTER — Ambulatory Visit
Admission: EM | Admit: 2023-02-05 | Discharge: 2023-02-05 | Disposition: A | Discharge status: Home / Self Care | Payer: Self-pay | Attending: Nurse Practitioner | Admitting: Nurse Practitioner

## 2023-02-05 ENCOUNTER — Encounter: Payer: Self-pay | Admitting: Emergency Medicine

## 2023-02-05 DIAGNOSIS — H6593 Unspecified nonsuppurative otitis media, bilateral: Secondary | ICD-10-CM

## 2023-02-05 DIAGNOSIS — J45901 Unspecified asthma with (acute) exacerbation: Secondary | ICD-10-CM

## 2023-02-05 DIAGNOSIS — J069 Acute upper respiratory infection, unspecified: Secondary | ICD-10-CM

## 2023-02-05 LAB — POC COVID19/FLU A&B COMBO
Covid Antigen, POC: NEGATIVE
Influenza A Antigen, POC: NEGATIVE
Influenza B Antigen, POC: NEGATIVE

## 2023-02-05 MED ORDER — DEXAMETHASONE SODIUM PHOSPHATE 10 MG/ML IJ SOLN
10.0000 mg | INTRAMUSCULAR | Status: AC
  Administered 2023-02-05: 10 mg via INTRAMUSCULAR

## 2023-02-05 MED ORDER — PREDNISONE 20 MG PO TABS: 40.0000 mg | ORAL_TABLET | Freq: Every day | ORAL | 0 refills | Status: AC

## 2023-02-05 MED ORDER — ALBUTEROL SULFATE (2.5 MG/3ML) 0.083% IN NEBU: 2.5000 mg | INHALATION_SOLUTION | Freq: Four times a day (QID) | RESPIRATORY_TRACT | 0 refills | Status: AC | PRN

## 2023-02-05 MED ORDER — ALBUTEROL SULFATE (2.5 MG/3ML) 0.083% IN NEBU
2.5000 mg | INHALATION_SOLUTION | Freq: Once | RESPIRATORY_TRACT | Status: AC
  Administered 2023-02-05: 2.5 mg via RESPIRATORY_TRACT

## 2023-02-05 MED ORDER — PROMETHAZINE-DM 6.25-15 MG/5ML PO SYRP: 5.0000 mL | ORAL_SOLUTION | Freq: Four times a day (QID) | ORAL | 0 refills | Status: AC | PRN

## 2023-02-05 MED ORDER — ALBUTEROL SULFATE HFA 108 (90 BASE) MCG/ACT IN AERS: 2.0000 | INHALATION_SPRAY | Freq: Four times a day (QID) | RESPIRATORY_TRACT | 0 refills | Status: AC | PRN

## 2023-02-05 NOTE — Discharge Instructions (Addendum)
 The COVID/flu test was negative. Take medication as prescribed. Increase fluids and allow for plenty of rest. May take over-the-counter Tylenol  or ibuprofen  as needed for pain, fever, or general discomfort. Recommend use of a humidifier in your bedroom at nighttime during sleep and sleeping elevated on pillows while symptoms persist. It is important for you to try to cut back on your smoking and consider smoking cessation with your underlying history of asthma, as well as with your current symptoms. Follow-up with your PCP if symptoms fail to improve with this treatment. Follow-up as needed.

## 2023-02-05 NOTE — ED Triage Notes (Signed)
 Pt reports headache, nasal congestion, cough, fatigue and intermittent dizziness since Saturday.

## 2023-02-05 NOTE — ED Provider Notes (Signed)
 [facility]    CSN: [id number] Arrival date & time: 02/05/23  1008      History   Chief Complaint Chief Complaint  Patient presents with   Cough    HPI Mary Hull is a 28 y.o. female.   The history is provided by the patient.   Patient presents for complaints of cough, nasal congestion, and bilateral ear pressure and fullness.  Patient states symptoms started over the past several days.  She states with her ears, she does feel some dizziness.  She reports intermittent wheezing as she does have a history of asthma.  Denies fever, chills, runny nose, shortness of breath, difficulty breathing, chest pain, abdominal pain, nausea, vomiting, diarrhea, or rash.  Patient reports she has used her albuterol  inhaler on 1 occasion since her symptoms started.  Denies any obvious known sick contacts.  Past Medical History:  Diagnosis Date   Asthma    Gallbladder sludge    Gestational diabetes    diet controlled   Postpartum depression     Patient Active Problem List   Diagnosis Date Noted   Abdominal pain 02/17/2019   Nausea without vomiting 02/17/2019   Microcytic anemia 02/17/2019   Elevated liver enzymes 03/18/2018   Gallbladder sludge    History of gestational diabetes 06/20/2016   Abnormal findings on diagnostic imaging of gall bladder 03/27/2016    Past Surgical History:  Procedure Laterality Date   ADENOIDECTOMY     COLONOSCOPY WITH PROPOFOL  N/A 03/08/2019   Procedure: COLONOSCOPY WITH PROPOFOL ;  Surgeon: Shaaron Lamar HERO, MD;  Entire examined colon is normal.  Distal TI appeared normal.  Repeat at age 71 for screening purposes.  Suspected IDA secondary to heavy menstrual cycles, possible element of iron malabsorption.   ESOPHAGOGASTRODUODENOSCOPY (EGD) WITH PROPOFOL  N/A 03/08/2019   Procedure: ESOPHAGOGASTRODUODENOSCOPY (EGD) WITH PROPOFOL ;  Surgeon: Shaaron Lamar HERO, MD;  Mild erosive reflux esophagitis, normal stomach, normal examined duodenum.   FRACTURE  SURGERY Left    L elbow   TONSILLECTOMY      OB History     Gravida  2   Para  2   Term  2   Preterm      AB      Living  2      SAB      IAB      Ectopic      Multiple  0   Live Births  2            Home Medications    Prior to Admission medications   Medication Sig Start Date End Date Taking? Authorizing Provider  albuterol  (PROVENTIL ) (2.5 MG/3ML) 0.083% nebulizer solution Take 3 mLs (2.5 mg total) by nebulization every 6 (six) hours as needed for wheezing or shortness of breath. 02/05/23  Yes Leath-Warren, Etta PARAS, NP  albuterol  (VENTOLIN  HFA) 108 (90 Base) MCG/ACT inhaler Inhale 2 puffs into the lungs every 6 (six) hours as needed. 02/05/23  Yes Leath-Warren, Etta PARAS, NP  predniSONE  (DELTASONE ) 20 MG tablet Take 2 tablets (40 mg total) by mouth daily with breakfast for 5 days. 02/05/23 02/10/23 Yes Leath-Warren, Etta PARAS, NP  promethazine -dextromethorphan (PROMETHAZINE -DM) 6.25-15 MG/5ML syrup Take 5 mLs by mouth 4 (four) times daily as needed. 02/05/23  Yes Leath-Warren, Etta PARAS, NP  azithromycin  (ZITHROMAX ) 250 MG tablet Take 1 tablet (250 mg total) by mouth daily. Take first 2 tablets together, then 1 every day until finished. 10/04/21   Leath-Warren, Etta PARAS, NP  budesonide -formoterol  (SYMBICORT )  160-4.5 MCG/ACT inhaler Inhale 2 puffs into the lungs 2 (two) times daily. Rinse mouth with water after each use 03/31/22   Fleming, Zelda W, NP  cetirizine  (ZYRTEC ) 10 MG tablet Take 1 tablet (10 mg total) by mouth daily. 10/04/21   Leath-Warren, Etta PARAS, NP  fluticasone  (FLONASE ) 50 MCG/ACT nasal spray Place 2 sprays into both nostrils daily. 05/06/22   Reyes, Shaylo, PA-C  ondansetron  (ZOFRAN ) 4 MG tablet Take 1 tablet (4 mg total) by mouth every 8 (eight) hours as needed for nausea or vomiting. 12/24/21   Kennyth Domino, FNP  ondansetron  (ZOFRAN -ODT) 8 MG disintegrating tablet Take 1 tablet (8 mg total) by mouth every 8 (eight) hours as needed for nausea or  vomiting. 06/22/21   Emelia Sluder, PA-C  oxyCODONE -acetaminophen  (PERCOCET/ROXICET) 5-325 MG tablet Take 1 tablet by mouth every 6 (six) hours as needed for severe pain. 06/22/21   Emelia Sluder, PA-C  pantoprazole  (PROTONIX ) 40 MG tablet Take 1 tablet (40 mg total) by mouth daily before breakfast. Patient not taking: Reported on 07/08/2019 02/17/19   Rudy Josette RAMAN, PA-C  tamsulosin  (FLOMAX ) 0.4 MG CAPS capsule Take 1 capsule (0.4 mg total) by mouth daily after supper. 06/22/21   Emelia Sluder, PA-C    Family History Family History  Problem Relation Age of Onset   Anxiety disorder Father    Hypertension Maternal Grandmother    Hypertension Maternal Aunt    Colon cancer Neg Hx     Social History Social History   Tobacco Use   Smoking status: Some Days    Current packs/day: 0.25    Average packs/day: 0.3 packs/day for 1 year (0.3 ttl pk-yrs)    Types: Cigarettes   Smokeless tobacco: Never  Vaping Use   Vaping status: Never Used  Substance Use Topics   Alcohol use: Yes    Comment: Occasionally   Drug use: No     Allergies   Patient has no known allergies.   Review of Systems Review of Systems Per HPI  Physical Exam Triage Vital Signs ED Triage Vitals  Encounter Vitals Group     BP 02/05/23 1215 (!) 161/93     Systolic BP Percentile --      Diastolic BP Percentile --      Pulse Rate 02/05/23 1215 (!) 103     Resp 02/05/23 1215 20     Temp 02/05/23 1215 98.9 F (37.2 C)     Temp Source 02/05/23 1215 Oral     SpO2 02/05/23 1215 95 %     Weight --      Height --      Head Circumference --      Peak Flow --      Pain Score 02/05/23 1216 0     Pain Loc --      Pain Education --      Exclude from Growth Chart --    No data found.  Updated Vital Signs BP (!) 161/93 (BP Location: Right Arm)   Pulse (!) 103   Temp 98.9 F (37.2 C) (Oral)   Resp 20   LMP 02/04/2023 (Approximate)   SpO2 95%   Visual Acuity Right Eye Distance:   Left Eye Distance:   Bilateral  Distance:    Right Eye Near:   Left Eye Near:    Bilateral Near:     Physical Exam Vitals and nursing note reviewed.  Constitutional:      General: She is not in acute distress.  Appearance: Normal appearance.  HENT:     Head: Normocephalic.     Right Ear: Tympanic membrane, ear canal and external ear normal.     Left Ear: Tympanic membrane, ear canal and external ear normal.     Nose: Congestion present.     Mouth/Throat:     Mouth: Mucous membranes are moist.  Eyes:     Extraocular Movements: Extraocular movements intact.     Conjunctiva/sclera: Conjunctivae normal.     Pupils: Pupils are equal, round, and reactive to light.  Cardiovascular:     Rate and Rhythm: Normal rate and regular rhythm.     Pulses: Normal pulses.     Heart sounds: Normal heart sounds.  Pulmonary:     Effort: Pulmonary effort is normal.     Breath sounds: Wheezing (posterior LLL) and rhonchi present.     Comments: Post nebulizer treatment, decreased wheezing and rhonchi noted. Patient reports improvement of breathing. Abdominal:     General: Bowel sounds are normal.     Palpations: Abdomen is soft.     Tenderness: There is no abdominal tenderness.  Musculoskeletal:     Cervical back: Normal range of motion.  Lymphadenopathy:     Cervical: No cervical adenopathy.  Skin:    General: Skin is warm and dry.  Neurological:     General: No focal deficit present.     Mental Status: She is alert and oriented to person, place, and time.  Psychiatric:        Mood and Affect: Mood normal.        Behavior: Behavior normal.      UC Treatments / Results  Labs (all labs ordered are listed, but only abnormal results are displayed) Labs Reviewed  POC COVID19/FLU A&B COMBO    EKG   Radiology No results found.  Procedures Procedures (including critical care time)  Medications Ordered in UC Medications  dexamethasone  (DECADRON ) injection 10 mg (has no administration in time range)  albuterol   (PROVENTIL ) (2.5 MG/3ML) 0.083% nebulizer solution 2.5 mg (2.5 mg Nebulization Given 02/05/23 1247)    Initial Impression / Assessment and Plan / UC Course  I have reviewed the triage vital signs and the nursing notes.  Pertinent labs & imaging results that were available during my care of the patient were reviewed by me and considered in my medical decision making (see chart for details).  Influenza/COVID test was negative.  Suspect patient is experiencing a viral URI with underlying asthma exacerbation.  On exam, she also has a bilateral middle ear effusion.  Decadron  10mg  IM administered, albuterol  nebulizer treatment administered with good improvement of lung sounds. Will provide symptomatic treatment with prednisone  40 mg for asthma exacerbation, and albuterol  inhaler for asthma, albuterol  nebulizer for asthma, and Promethazine  DM for her cough.  The prednisone  should help with the middle ear effusions.  Supportive care recommendations were provided and discussed with the patient to include over-the-counter analgesics, fluids, rest, and use of a humidifier during sleep.  Patient was also advised to work on smoking cessation as this will only worsen her asthma long-term.  Patient was in agreement with this plan of care and verbalized understanding.  All questions were answered.  Patient stable for discharge.  Final Clinical Impressions(s) / UC Diagnoses   Final diagnoses:  Viral URI with cough  Mild asthma with acute exacerbation, unspecified whether persistent  Middle ear effusion, bilateral     Discharge Instructions      The COVID/flu test was negative. Take  medication as prescribed. Increase fluids and allow for plenty of rest. May take over-the-counter Tylenol  or ibuprofen  as needed for pain, fever, or general discomfort. Recommend use of a humidifier in your bedroom at nighttime during sleep and sleeping elevated on pillows while symptoms persist. It is important for you to try to  cut back on your smoking and consider smoking cessation with your underlying history of asthma, as well as with your current symptoms. Follow-up with your PCP if symptoms fail to improve with this treatment. Follow-up as needed.     ED Prescriptions     Medication Sig Dispense Auth. Provider   predniSONE  (DELTASONE ) 20 MG tablet Take 2 tablets (40 mg total) by mouth daily with breakfast for 5 days. 10 tablet Leath-Warren, Etta PARAS, NP   albuterol  (VENTOLIN  HFA) 108 (90 Base) MCG/ACT inhaler Inhale 2 puffs into the lungs every 6 (six) hours as needed. 8 g Leath-Warren, Etta PARAS, NP   albuterol  (PROVENTIL ) (2.5 MG/3ML) 0.083% nebulizer solution Take 3 mLs (2.5 mg total) by nebulization every 6 (six) hours as needed for wheezing or shortness of breath. 75 mL Leath-Warren, Etta PARAS, NP   promethazine -dextromethorphan (PROMETHAZINE -DM) 6.25-15 MG/5ML syrup Take 5 mLs by mouth 4 (four) times daily as needed. 118 mL Leath-Warren, Etta PARAS, NP      PDMP not reviewed this encounter.   Gilmer Etta PARAS, NP 02/05/23 1308

## 2023-02-14 ENCOUNTER — Ambulatory Visit: Payer: Self-pay

## 2023-02-15 ENCOUNTER — Telehealth: Payer: Self-pay | Admitting: Nurse Practitioner

## 2023-02-15 DIAGNOSIS — H6993 Unspecified Eustachian tube disorder, bilateral: Secondary | ICD-10-CM

## 2023-02-15 MED ORDER — IPRATROPIUM BROMIDE 0.03 % NA SOLN: 2.0000 | Freq: Two times a day (BID) | NASAL | 0 refills | Status: AC

## 2023-02-15 NOTE — Progress Notes (Signed)

## 2023-02-15 NOTE — Progress Notes (Signed)
I have spent 5 minutes in review of e-visit questionnaire, review and updating patient chart, medical decision making and response to patient.  ° °Jerrell Mangel W Secilia Apps, NP ° °  °

## 2023-12-05 ENCOUNTER — Ambulatory Visit: Payer: Self-pay
